# Patient Record
Sex: Male | Born: 1945 | Race: Black or African American | Hispanic: No | Smoking: Former smoker
Health system: Southern US, Community
[De-identification: ages and names within clinical notes are randomized; demographics above are authoritative.]

## PROBLEM LIST (undated history)

## (undated) DIAGNOSIS — I2510 Atherosclerotic heart disease of native coronary artery without angina pectoris: Secondary | ICD-10-CM

## (undated) DIAGNOSIS — R54 Age-related physical debility: Secondary | ICD-10-CM

## (undated) DIAGNOSIS — E785 Hyperlipidemia, unspecified: Secondary | ICD-10-CM

## (undated) DIAGNOSIS — M109 Gout, unspecified: Secondary | ICD-10-CM

## (undated) DIAGNOSIS — G231 Progressive supranuclear ophthalmoplegia [Steele-Richardson-Olszewski]: Secondary | ICD-10-CM

## (undated) DIAGNOSIS — I10 Essential (primary) hypertension: Secondary | ICD-10-CM

## (undated) DIAGNOSIS — M1990 Unspecified osteoarthritis, unspecified site: Secondary | ICD-10-CM

## (undated) DIAGNOSIS — I509 Heart failure, unspecified: Secondary | ICD-10-CM

## (undated) DIAGNOSIS — R410 Disorientation, unspecified: Secondary | ICD-10-CM

## (undated) DIAGNOSIS — N189 Chronic kidney disease, unspecified: Secondary | ICD-10-CM

## (undated) DIAGNOSIS — D649 Anemia, unspecified: Secondary | ICD-10-CM

## (undated) HISTORY — PX: TONSILLECTOMY: SUR1361

## (undated) HISTORY — PX: ABDOMINAL EXPLORATION SURGERY: SHX538

## (undated) HISTORY — PX: APPENDECTOMY: SHX54

## (undated) HISTORY — PX: CARDIAC SURGERY: SHX584

---

## 2013-05-27 ENCOUNTER — Emergency Department (HOSPITAL_COMMUNITY): Payer: Medicare HMO

## 2013-05-27 ENCOUNTER — Encounter (HOSPITAL_COMMUNITY): Payer: Self-pay | Admitting: Emergency Medicine

## 2013-05-27 ENCOUNTER — Inpatient Hospital Stay (HOSPITAL_COMMUNITY)
Admission: AD | Admit: 2013-05-27 | Discharge: 2013-05-31 | DRG: 291 | Disposition: A | Discharge status: Home Health | Payer: Medicare HMO | Attending: Internal Medicine | Admitting: Internal Medicine

## 2013-05-27 DIAGNOSIS — E785 Hyperlipidemia, unspecified: Secondary | ICD-10-CM | POA: Diagnosis present

## 2013-05-27 DIAGNOSIS — N289 Disorder of kidney and ureter, unspecified: Secondary | ICD-10-CM | POA: Diagnosis present

## 2013-05-27 DIAGNOSIS — I5043 Acute on chronic combined systolic (congestive) and diastolic (congestive) heart failure: Secondary | ICD-10-CM | POA: Diagnosis present

## 2013-05-27 DIAGNOSIS — T380X5A Adverse effect of glucocorticoids and synthetic analogues, initial encounter: Secondary | ICD-10-CM | POA: Diagnosis present

## 2013-05-27 DIAGNOSIS — I248 Other forms of acute ischemic heart disease: Secondary | ICD-10-CM | POA: Diagnosis present

## 2013-05-27 DIAGNOSIS — M25469 Effusion, unspecified knee: Secondary | ICD-10-CM | POA: Diagnosis present

## 2013-05-27 DIAGNOSIS — R443 Hallucinations, unspecified: Secondary | ICD-10-CM | POA: Diagnosis present

## 2013-05-27 DIAGNOSIS — I2489 Other forms of acute ischemic heart disease: Secondary | ICD-10-CM | POA: Diagnosis present

## 2013-05-27 DIAGNOSIS — IMO0002 Reserved for concepts with insufficient information to code with codable children: Secondary | ICD-10-CM | POA: Diagnosis present

## 2013-05-27 DIAGNOSIS — F17200 Nicotine dependence, unspecified, uncomplicated: Secondary | ICD-10-CM | POA: Diagnosis present

## 2013-05-27 DIAGNOSIS — N183 Chronic kidney disease, stage 3 unspecified: Secondary | ICD-10-CM | POA: Diagnosis present

## 2013-05-27 DIAGNOSIS — R7989 Other specified abnormal findings of blood chemistry: Secondary | ICD-10-CM | POA: Diagnosis present

## 2013-05-27 DIAGNOSIS — I129 Hypertensive chronic kidney disease with stage 1 through stage 4 chronic kidney disease, or unspecified chronic kidney disease: Secondary | ICD-10-CM | POA: Diagnosis present

## 2013-05-27 DIAGNOSIS — N189 Chronic kidney disease, unspecified: Secondary | ICD-10-CM | POA: Diagnosis present

## 2013-05-27 DIAGNOSIS — I2510 Atherosclerotic heart disease of native coronary artery without angina pectoris: Secondary | ICD-10-CM | POA: Diagnosis present

## 2013-05-27 DIAGNOSIS — I10 Essential (primary) hypertension: Secondary | ICD-10-CM | POA: Diagnosis present

## 2013-05-27 DIAGNOSIS — D72829 Elevated white blood cell count, unspecified: Secondary | ICD-10-CM | POA: Diagnosis present

## 2013-05-27 DIAGNOSIS — M109 Gout, unspecified: Secondary | ICD-10-CM | POA: Diagnosis present

## 2013-05-27 DIAGNOSIS — N179 Acute kidney failure, unspecified: Secondary | ICD-10-CM | POA: Diagnosis present

## 2013-05-27 DIAGNOSIS — Z79899 Other long term (current) drug therapy: Secondary | ICD-10-CM

## 2013-05-27 DIAGNOSIS — M79609 Pain in unspecified limb: Secondary | ICD-10-CM | POA: Diagnosis present

## 2013-05-27 DIAGNOSIS — I509 Heart failure, unspecified: Principal | ICD-10-CM | POA: Diagnosis present

## 2013-05-27 DIAGNOSIS — Z951 Presence of aortocoronary bypass graft: Secondary | ICD-10-CM

## 2013-05-27 DIAGNOSIS — G9340 Encephalopathy, unspecified: Secondary | ICD-10-CM | POA: Diagnosis not present

## 2013-05-27 HISTORY — DX: Hyperlipidemia, unspecified: E78.5

## 2013-05-27 HISTORY — DX: Atherosclerotic heart disease of native coronary artery without angina pectoris: I25.10

## 2013-05-27 HISTORY — DX: Essential (primary) hypertension: I10

## 2013-05-27 HISTORY — DX: Gout, unspecified: M10.9

## 2013-05-27 LAB — CBC WITH DIFFERENTIAL/PLATELET
Basophils Relative: 0 % (ref 0–1)
HCT: 35.7 % — ABNORMAL LOW (ref 39.0–52.0)
Hemoglobin: 11.7 g/dL — ABNORMAL LOW (ref 13.0–17.0)
Lymphocytes Relative: 14 % (ref 12–46)
MCHC: 32.8 g/dL (ref 30.0–36.0)
Monocytes Absolute: 0.8 10*3/uL (ref 0.1–1.0)
Monocytes Relative: 7 % (ref 3–12)
Neutro Abs: 8.4 10*3/uL — ABNORMAL HIGH (ref 1.7–7.7)
Neutrophils Relative %: 78 % — ABNORMAL HIGH (ref 43–77)
Platelets: 228 10*3/uL (ref 150–400)
RBC: 3.94 MIL/uL — ABNORMAL LOW (ref 4.22–5.81)
WBC: 10.7 10*3/uL — ABNORMAL HIGH (ref 4.0–10.5)

## 2013-05-27 LAB — BASIC METABOLIC PANEL
BUN: 40 mg/dL — ABNORMAL HIGH (ref 6–23)
CO2: 20 mEq/L (ref 19–32)
Chloride: 108 mEq/L (ref 96–112)
Creatinine, Ser: 1.97 mg/dL — ABNORMAL HIGH (ref 0.50–1.35)
GFR calc Af Amer: 38 mL/min — ABNORMAL LOW (ref >90)
Potassium: 4.3 mEq/L (ref 3.5–5.1)

## 2013-05-27 LAB — CBC
HCT: 36.8 % — ABNORMAL LOW (ref 39.0–52.0)
Hemoglobin: 11.8 g/dL — ABNORMAL LOW (ref 13.0–17.0)
MCH: 29 pg (ref 26.0–34.0)
MCHC: 32.1 g/dL (ref 30.0–36.0)
MCV: 90.4 fL (ref 78.0–100.0)
Platelets: 221 10*3/uL (ref 150–400)
RDW: 14.1 % (ref 11.5–15.5)
WBC: 12.6 10*3/uL — ABNORMAL HIGH (ref 4.0–10.5)

## 2013-05-27 LAB — TSH: TSH: 0.623 u[IU]/mL (ref 0.350–4.500)

## 2013-05-27 LAB — TROPONIN I
Troponin I: 0.5 ng/mL (ref <0.30)
Troponin I: 0.42 ng/mL (ref <0.30)
Troponin I: 0.4 ng/mL (ref <0.30)

## 2013-05-27 LAB — CREATININE, SERUM: GFR calc Af Amer: 39 mL/min — ABNORMAL LOW (ref >90)

## 2013-05-27 LAB — PRO B NATRIURETIC PEPTIDE: Pro B Natriuretic peptide (BNP): 6983 pg/mL — ABNORMAL HIGH (ref 0–125)

## 2013-05-27 MED ORDER — SODIUM CHLORIDE 0.9 % IV SOLN: 250.0000 mL | INTRAVENOUS | Status: DC | PRN

## 2013-05-27 MED ORDER — SODIUM CHLORIDE 0.9 % IJ SOLN
3.0000 mL | INTRAMUSCULAR | Status: DC | PRN
  Administered 2013-05-27: 16:00:00 3 mL via INTRAVENOUS

## 2013-05-27 MED ORDER — MORPHINE SULFATE 4 MG/ML IJ SOLN
4.0000 mg | Freq: Once | INTRAMUSCULAR | Status: AC
  Administered 2013-05-27: 4 mg via INTRAVENOUS
  Filled 2013-05-27: qty 1

## 2013-05-27 MED ORDER — ATORVASTATIN CALCIUM 20 MG PO TABS
20.0000 mg | ORAL_TABLET | Freq: Every day | ORAL | Status: DC
  Administered 2013-05-27 – 2013-05-31 (×5): 20 mg via ORAL
  Filled 2013-05-27 (×5): qty 1

## 2013-05-27 MED ORDER — COLCHICINE 0.6 MG PO TABS
1.2000 mg | ORAL_TABLET | Freq: Once | ORAL | Status: AC
  Administered 2013-05-27: 0.6 mg via ORAL
  Filled 2013-05-27: qty 2

## 2013-05-27 MED ORDER — PREDNISONE 20 MG PO TABS
20.0000 mg | ORAL_TABLET | Freq: Every day | ORAL | Status: DC
  Administered 2013-05-28: 08:00:00 20 mg via ORAL
  Filled 2013-05-27 (×2): qty 1

## 2013-05-27 MED ORDER — MORPHINE SULFATE 2 MG/ML IJ SOLN
1.0000 mg | INTRAMUSCULAR | Status: DC | PRN
  Administered 2013-05-28 – 2013-05-30 (×4): 2 mg via INTRAVENOUS
  Filled 2013-05-27 (×4): qty 1

## 2013-05-27 MED ORDER — ASPIRIN EC 325 MG PO TBEC
325.0000 mg | DELAYED_RELEASE_TABLET | Freq: Every day | ORAL | Status: DC
  Administered 2013-05-27 – 2013-05-31 (×5): 325 mg via ORAL
  Filled 2013-05-27 (×5): qty 1

## 2013-05-27 MED ORDER — HYDRALAZINE HCL 20 MG/ML IJ SOLN
10.0000 mg | Freq: Four times a day (QID) | INTRAMUSCULAR | Status: DC | PRN
Start: 2013-05-27 — End: 2013-05-31
  Administered 2013-05-27 (×2): 10 mg via INTRAVENOUS
  Filled 2013-05-27 (×3): qty 1

## 2013-05-27 MED ORDER — ONDANSETRON HCL 4 MG PO TABS: 4.0000 mg | ORAL_TABLET | Freq: Four times a day (QID) | ORAL | Status: DC | PRN

## 2013-05-27 MED ORDER — SODIUM CHLORIDE 0.9 % IJ SOLN
3.0000 mL | Freq: Two times a day (BID) | INTRAMUSCULAR | Status: DC
  Administered 2013-05-27 – 2013-05-31 (×5): 3 mL via INTRAVENOUS

## 2013-05-27 MED ORDER — LABETALOL HCL 5 MG/ML IV SOLN
5.0000 mg | Freq: Once | INTRAVENOUS | Status: AC
  Administered 2013-05-27: 15:00:00 5 mg via INTRAVENOUS
  Filled 2013-05-27: qty 4

## 2013-05-27 MED ORDER — HEPARIN SODIUM (PORCINE) 5000 UNIT/ML IJ SOLN
5000.0000 [IU] | Freq: Three times a day (TID) | INTRAMUSCULAR | Status: DC
  Administered 2013-05-27 – 2013-05-31 (×13): 5000 [IU] via SUBCUTANEOUS
  Filled 2013-05-27 (×15): qty 1

## 2013-05-27 MED ORDER — OXYCODONE HCL 5 MG PO TABS
5.0000 mg | ORAL_TABLET | ORAL | Status: DC | PRN
  Administered 2013-05-27 – 2013-05-31 (×9): 5 mg via ORAL
  Filled 2013-05-27 (×9): qty 1

## 2013-05-27 MED ORDER — ONDANSETRON HCL 4 MG/2ML IJ SOLN: 4.0000 mg | Freq: Four times a day (QID) | INTRAMUSCULAR | Status: DC | PRN

## 2013-05-27 MED ORDER — CARVEDILOL 25 MG PO TABS
25.0000 mg | ORAL_TABLET | Freq: Two times a day (BID) | ORAL | Status: DC
  Administered 2013-05-27 – 2013-05-31 (×9): 25 mg via ORAL
  Filled 2013-05-27 (×10): qty 1

## 2013-05-27 MED ORDER — SODIUM CHLORIDE 0.9 % IV SOLN
INTRAVENOUS | Status: DC
  Administered 2013-05-27: 10:00:00 via INTRAVENOUS

## 2013-05-27 MED ORDER — COLCHICINE 0.6 MG PO TABS
0.6000 mg | ORAL_TABLET | Freq: Once | ORAL | Status: AC
  Filled 2013-05-27: qty 1

## 2013-05-27 MED ORDER — SODIUM CHLORIDE 0.9 % IJ SOLN
3.0000 mL | Freq: Two times a day (BID) | INTRAMUSCULAR | Status: DC
  Administered 2013-05-30: 22:00:00 3 mL via INTRAVENOUS

## 2013-05-27 MED ORDER — SODIUM CHLORIDE 0.9 % IV SOLN
INTRAVENOUS | Status: DC
  Administered 2013-05-27: 15:00:00 via INTRAVENOUS

## 2013-05-27 MED ORDER — FUROSEMIDE 10 MG/ML IJ SOLN
40.0000 mg | Freq: Once | INTRAMUSCULAR | Status: AC
  Administered 2013-05-27: 16:00:00 40 mg via INTRAVENOUS
  Filled 2013-05-27: qty 4

## 2013-05-27 MED ORDER — NITROGLYCERIN 0.4 MG SL SUBL: 0.4000 mg | SUBLINGUAL_TABLET | SUBLINGUAL | Status: DC | PRN

## 2013-05-27 MED ORDER — COLCHICINE 0.6 MG PO TABS
0.6000 mg | ORAL_TABLET | Freq: Every day | ORAL | Status: DC
  Administered 2013-05-28 – 2013-05-29 (×2): 0.6 mg via ORAL
  Filled 2013-05-27 (×2): qty 1

## 2013-05-27 MED ORDER — AMLODIPINE BESYLATE 10 MG PO TABS
10.0000 mg | ORAL_TABLET | Freq: Every day | ORAL | Status: DC
  Administered 2013-05-27 – 2013-05-31 (×5): 10 mg via ORAL
  Filled 2013-05-27 (×5): qty 1

## 2013-05-27 MED ORDER — MORPHINE SULFATE 2 MG/ML IJ SOLN
1.0000 mg | INTRAMUSCULAR | Status: DC | PRN
  Administered 2013-05-27: 1 mg via INTRAVENOUS
  Filled 2013-05-27: qty 1

## 2013-05-27 NOTE — ED Notes (Signed)
Lab result called to MD, RN

## 2013-05-27 NOTE — H&P (Signed)
[facility] History and Physical  Martin Cain JXB:[id number] DOB: Oct 21, 1945 DOA: 05/27/2013  Referring physician: Dr. Freida Busman PCP: No primary provider on file.  Specialists: I have consulted cardiology. [place]  Chief Complaint: Right big toe pain, Right knee pain  HPI: Martin Cain is a 67 y.o. male  With history of coronary artery disease status post CABG 4 vessel, gout, and hypertension. Who presents to the emergency department complaining of right great toe as well as right knee pain. The patient is accompanied by his daughter which she is visiting for the holidays. Patient lives in [place]. He denies any history of congestive heart failure or kidney disease. He denies any chest discomfort. States that the knee pain right big toe pain had been present the last few days has been progressively getting worse and nothing he is aware of makes it better. He denies any recent falls or trauma to the area,  In the emergency department patient was found to have an elevated creatinine to 1.9, elevated troponin, and elevated BNP at 6983.  Given findings we will consult for further admission and evaluation.  Review of Systems:   Past Medical History  Diagnosis Date  . Hypertension    Past Surgical History  Procedure Laterality Date  . Cardiac surgery     Social History:  reports that he has been smoking Cigarettes.  He has a 20 pack-year smoking history. He has never used smokeless tobacco. He reports that he drinks alcohol. He reports that he does not use illicit drugs. Pt lives in [place]: [place]  Can patient participate in ADLs? Yes although limited currently due to pain  No Known Allergies  History reviewed. No pertinent family history. no other family history  Prior to Admission medications   Medication Sig Start Date End Date Taking? Authorizing Provider  amLODipine (NORVASC) 10 MG tablet Take 10 mg by mouth daily.   Yes Historical Provider, MD  carvedilol (COREG) 25  MG tablet Take 25 mg by mouth 2 (two) times daily with a meal.   Yes Historical Provider, MD  furosemide (LASIX) 40 MG tablet Take 40 mg by mouth.   Yes Historical Provider, MD  ibuprofen (ADVIL,MOTRIN) 800 MG tablet Take 800 mg by mouth every 8 (eight) hours as needed (pain).   Yes Historical Provider, MD  lisinopril (PRINIVIL,ZESTRIL) 40 MG tablet Take 40 mg by mouth daily.   Yes Historical Provider, MD  nitroGLYCERIN (NITROSTAT) 0.4 MG SL tablet Place 0.4 mg under the tongue every 5 (five) minutes as needed for chest pain.   Yes Historical Provider, MD  rosuvastatin (CRESTOR) 10 MG tablet Take 10 mg by mouth daily.   Yes Historical Provider, MD   Physical Exam: Filed Vitals:   05/27/13 1335  BP: 215/79  Pulse: 104  Temp: 98.8 F (37.1 C)  Resp: 18     General:  Pt in NAD, awakens on command, somnolent  Eyes: EOMI, non icteric  ENT: normal exterior appearance  Neck: supple, no goiter  Cardiovascular: RRR, no MRG  Respiratory: CTA BL, no wheezes  Abdomen: soft, NT, ND  Skin: warm and dry  Musculoskeletal: pain with passive movement of Right big toe and RLE  Psychiatric: mood and affect appropriate  Neurologic: answers questions appropriately, moves extremities equally.  Labs on Admission:  Basic Metabolic Panel:  Recent Labs Lab 05/27/13 1005  NA 141  K 4.3  CL 108  CO2 20  GLUCOSE 105*  BUN 40*  CREATININE 1.97*  CALCIUM 9.3   Liver  Function Tests: No results found for this basename: AST, ALT, ALKPHOS, BILITOT, PROT, ALBUMIN,  in the last 168 hours No results found for this basename: LIPASE, AMYLASE,  in the last 168 hours No results found for this basename: AMMONIA,  in the last 168 hours CBC:  Recent Labs Lab 05/27/13 1005  WBC 10.7*  NEUTROABS 8.4*  HGB 11.7*  HCT 35.7*  MCV 90.6  PLT 228   Cardiac Enzymes:  Recent Labs Lab 05/27/13 1121  TROPONINI 0.50*    BNP (last 3 results)  Recent Labs  05/27/13 1005  PROBNP 6983.0*    CBG: No results found for this basename: GLUCAP,  in the last 168 hours  Radiological Exams on Admission: Dg Chest 2 View  05/27/2013   CLINICAL DATA:  Hypertension, peripheral swelling  EXAM: CHEST  2 VIEW  COMPARISON:  None  FINDINGS: Cardiac shadow is mildly enlarged. Postsurgical changes are seen. The lungs are well aerated bilaterally without focal infiltrate or sizable effusion. No acute bony abnormality is noted.  IMPRESSION: No acute abnormality noted.   Electronically Signed   By: Alcide Clever M.D.   On: 05/27/2013 10:10    EKG: Independently reviewed. Sinus tachycardia with LVH by precordial lead criteria, With elevated ST elevation at V1-V4 and inverted T waves at V 5 and V 6  Assessment/Plan Principal problem Acute Congestive heart failure (unknown whether systolic or diastolic) and elevated troponin Active Problems:   Renal insufficiency   Acute exacerbation of CHF (congestive heart failure) Acute gout flair    1. Acute congestive heart failure - At this point unknown whether systolic or diastolic given the patient is from [place] and currently we do not have any records to distinguish. Patient denies any history of CHF. - Will monitor on telemetry - Obtain echocardiogram - Consult cardiology given complicated cardiac history as well as elevated troponin - Could be secondary to malignant hypertension - Daily weights, strict I/O's  2. elevated troponin - Most likely due to demand ischemia given #1 - Consult cardiology - Will place on aspirin while in house - Continue n.p.o. status until cleared by cardiology  3. acute gout flare - Consult for pharmacy for proper colchicine dose given elevated creatinine - We'll administer low-dose prednisone - Place order for opioids when necessary , Have discussed with nursing as patient currently somnolent from morphine administration in the ED as such would avoid opioids until patient more alert and awake.  4. renal  insufficiency - Do not have prior history to indicate baseline serum creatinine - At this juncture we'll hold ACE inhibitor as well as nonsteroidal anti-inflammatory medication - We'll monitor serum creatinine next a.m.  5. DVT prophylaxis - Place on heparin  Code Status: Partial code patient does not want intubation Family Communication: Discussed with patient and daughter at bedside  Disposition Plan: Pending further evaluation and recommendations from specialist and improvement in current condition  Time spent: > 55 minutes  Martin Cain [facility] Pager [phone]  If 7PM-7AM, please contact night-coverage [email or web address] Password [facility] 05/27/2013, 3:04 PM

## 2013-05-27 NOTE — Consult Note (Addendum)
HPI: 67 yo male for evaluation of CHF and abnormal troponin. Had CABG 2000 in [place]; no records available. Patient followed there but here visiting. Patient presented today with complaints of right knee pain and diagnosed with gout. Noted to have lower extremity edema and abnormal troponin and cardiology asked to evaluate; denies chest pain, dyspnea or syncope. States he has had lower ext edema for 3 days only.   Medications Prior to Admission  Medication Sig Dispense Refill  . amLODipine (NORVASC) 10 MG tablet Take 10 mg by mouth daily.      . carvedilol (COREG) 25 MG tablet Take 25 mg by mouth 2 (two) times daily with a meal.      . furosemide (LASIX) 40 MG tablet Take 40 mg by mouth.      Marland Kitchen ibuprofen (ADVIL,MOTRIN) 800 MG tablet Take 800 mg by mouth every 8 (eight) hours as needed (pain).      Marland Kitchen lisinopril (PRINIVIL,ZESTRIL) 40 MG tablet Take 40 mg by mouth daily.      . nitroGLYCERIN (NITROSTAT) 0.4 MG SL tablet Place 0.4 mg under the tongue every 5 (five) minutes as needed for chest pain.      . rosuvastatin (CRESTOR) 10 MG tablet Take 10 mg by mouth daily.        No Known Allergies  Past Medical History  Diagnosis Date  . Hypertension   . Hyperlipidemia   . Renal insufficiency   . CAD (coronary artery disease)   . Gout     Past Surgical History  Procedure Laterality Date  . Cardiac surgery      2000; [place]  . Appendectomy    . Abdominal exploration surgery    . Tonsillectomy      History   Social History  . Marital Status: Divorced    Spouse Name: N/A    Number of Children: 2  . Years of Education: N/A   Occupational History  . Not on file.   Social History Main Topics  . Smoking status: Current Every Day Smoker -- 0.50 packs/day for 40 years    Types: Cigarettes  . Smokeless tobacco: Never Used  . Alcohol Use: Yes     Comment: 1 pint per day   . Drug Use: No  . Sexual Activity: Not on file   Other Topics Concern  . Not on file   Social  History Narrative  . No narrative on file    Family History  Problem Relation Age of Onset  . Heart disease      No family history    ROS:  Right knee pain and lower ext edema but no fevers or chills, productive cough, hemoptysis, dysphasia, odynophagia, melena, hematochezia, dysuria, hematuria, rash, seizure activity, orthopnea, PND, claudication. Remaining systems are negative.  Physical Exam:   Blood pressure 215/79, pulse 104, temperature 98.8 F (37.1 C), temperature source Oral, resp. rate 18, SpO2 100.00%.  General:  Well developed/thin in NAD Skin warm/dry Patient not depressed No peripheral clubbing Back-normal HEENT-normal/normal eyelids Neck supple/normal carotid upstroke bilaterally; no bruits; positive JVD; no thyromegaly chest - CTA/ normal expansion; prior sternotomy CV - RRR/normal S1 and S2; no murmurs, rubs;  PMI displaced laterally; positive gallop Abdomen -NT/ND, no HSM, no mass, + bowel sounds, no bruit, previous abdominal surgery 2+ femoral pulse on left and 1+ on right, no bruits Ext-no chords; 1 + ankle edema (R>L); effusion right knee; tender to palpation Neuro-grossly nonfocal  ECG sinus rhythm, LAFB, cannot R/O prior  septal MI, LVH with repolarization abnormality.  Results for orders placed during the hospital encounter of 05/27/13 (from the past 48 hour(s))  PRO B NATRIURETIC PEPTIDE     Status: Abnormal   Collection Time    05/27/13 10:05 AM      Result Value Range   Pro B Natriuretic peptide (BNP) 6983.0 (*) 0 - 125 pg/mL  CBC WITH DIFFERENTIAL     Status: Abnormal   Collection Time    05/27/13 10:05 AM      Result Value Range   WBC 10.7 (*) 4.0 - 10.5 K/uL   RBC 3.94 (*) 4.22 - 5.81 MIL/uL   Hemoglobin 11.7 (*) 13.0 - 17.0 g/dL   HCT 09.8 (*) 11.9 - 14.7 %   MCV 90.6  78.0 - 100.0 fL   MCH 29.7  26.0 - 34.0 pg   MCHC 32.8  30.0 - 36.0 g/dL   RDW 82.9  56.2 - 13.0 %   Platelets 228  150 - 400 K/uL   Neutrophils Relative % 78 (*) 43 -  77 %   Neutro Abs 8.4 (*) 1.7 - 7.7 K/uL   Lymphocytes Relative 14  12 - 46 %   Lymphs Abs 1.5  0.7 - 4.0 K/uL   Monocytes Relative 7  3 - 12 %   Monocytes Absolute 0.8  0.1 - 1.0 K/uL   Eosinophils Relative 0  0 - 5 %   Eosinophils Absolute 0.0  0.0 - 0.7 K/uL   Basophils Relative 0  0 - 1 %   Basophils Absolute 0.0  0.0 - 0.1 K/uL  BASIC METABOLIC PANEL     Status: Abnormal   Collection Time    05/27/13 10:05 AM      Result Value Range   Sodium 141  135 - 145 mEq/L   Potassium 4.3  3.5 - 5.1 mEq/L   Chloride 108  96 - 112 mEq/L   CO2 20  19 - 32 mEq/L   Glucose, Bld 105 (*) 70 - 99 mg/dL   BUN 40 (*) 6 - 23 mg/dL   Creatinine, Ser 8.65 (*) 0.50 - 1.35 mg/dL   Calcium 9.3  8.4 - 78.4 mg/dL   GFR calc non Af Amer 33 (*) >90 mL/min   GFR calc Af Amer 38 (*) >90 mL/min   Comment: (NOTE)     The eGFR has been calculated using the CKD EPI equation.     This calculation has not been validated in all clinical situations.     eGFR's persistently <90 mL/min signify possible Chronic Kidney     Disease.  TROPONIN I     Status: Abnormal   Collection Time    05/27/13 11:21 AM      Result Value Range   Troponin I 0.50 (*) <0.30 ng/mL   Comment:            Due to the release kinetics of cTnI,     a negative result within the first hours     of the onset of symptoms does not rule out     myocardial infarction with certainty.     If myocardial infarction is still suspected,     repeat the test at appropriate intervals.     CRITICAL RESULT CALLED TO, READ BACK BY AND VERIFIED WITH:     WEST,S. RN AT 1153 05/27/13 BARFIELD,T    Dg Chest 2 View  05/27/2013   CLINICAL DATA:  Hypertension, peripheral swelling  EXAM: CHEST  2 VIEW  COMPARISON:  None  FINDINGS: Cardiac shadow is mildly enlarged. Postsurgical changes are seen. The lungs are well aerated bilaterally without focal infiltrate or sizable effusion. No acute bony abnormality is noted.  IMPRESSION: No acute abnormality noted.    Electronically Signed   By: Alcide Clever M.D.   On: 05/27/2013 10:10    Assessment/Plan 1 elevated troponin-patient is not having chest pain. Elevation may be related to renal insufficiency. Continue to cycle enzymes. If no clear trend up patient can followup in [place] with his cardiologist for outpatient nuclear study. 2 severe hypertension-patient's blood pressure is significantly elevated. He has not received his blood pressure medications yet today. Would resume all of his medications and follow blood pressure. Increase as needed. 3 congestive heart failure-patient's LV function is unknown. It would be helpful if we could obtain records from [place]. Schedule echocardiogram to assess LV function. Treat with Lasix 40 mg IV x 1 and reassess in AM. Follow renal function. 4 stage III renal insufficiency-baseline creatinine is unknown. We will obtain outside records. Follow renal function closely with diuresis. 5 coronary artery disease-continue aspirin and statin. 6 right knee with fusion-management per primary care. Would avoid nonsteroidals if possible given renal insufficiency Olga Millers MD 05/27/2013, 3:31 PM

## 2013-05-27 NOTE — ED Notes (Signed)
Bed: WA06 Expected date:  Expected time:  Means of arrival:  Comments: gout

## 2013-05-27 NOTE — ED Provider Notes (Signed)
CSN: [id number]     Arrival date & time 05/27/13  4098 History   First MD Initiated Contact with Patient 05/27/13 [phone]     Chief Complaint  Patient presents with  . Gout   (Consider location/radiation/quality/duration/timing/severity/associated sxs/prior Treatment) The history is provided by the patient.   Patient here complaining of 3 days of right knee and right first MTP joint pain similar to his prior gout. He also notes increased bilateral lower extremity pitting edema. Denies any chest pain or chest pressure. No shortness of breath. Denies any syncope or syncope. Pain characterized as sharp and worse with standing. No treatment used prior to arrival. No fever or chills. Denies any trauma to his knee or foot Past Medical History  Diagnosis Date  . Hypertension    Past Surgical History  Procedure Laterality Date  . Cardiac surgery     History reviewed. No pertinent family history. History  Substance Use Topics  . Smoking status: Current Every Day Smoker -- 0.50 packs/day    Types: Cigarettes  . Smokeless tobacco: Not on file  . Alcohol Use: Yes    Review of Systems  All other systems reviewed and are negative.    Allergies  Review of patient's allergies indicates no known allergies.  Home Medications  No current outpatient prescriptions on file. BP 189/97  Pulse 100  Temp(Src) 98.2 F (36.8 C) (Oral)  Resp 16  SpO2 95% Physical Exam  Nursing note and vitals reviewed. Constitutional: He is oriented to person, place, and time. He appears well-developed and well-nourished.  Non-toxic appearance. No distress.  HENT:  Head: Normocephalic and atraumatic.  Eyes: Conjunctivae, EOM and lids are normal. Pupils are equal, round, and reactive to light.  Neck: Normal range of motion. Neck supple. No tracheal deviation present. No mass present.  Cardiovascular: Normal rate, regular rhythm and normal heart sounds.  Exam reveals no gallop.   No murmur heard. Pulmonary/Chest:  Effort normal and breath sounds normal. No stridor. No respiratory distress. He has no decreased breath sounds. He has no wheezes. He has no rhonchi. He has no rales.  Abdominal: Soft. Normal appearance and bowel sounds are normal. He exhibits no distension. There is no tenderness. There is no rebound and no CVA tenderness.  Musculoskeletal: Normal range of motion. He exhibits no edema and no tenderness.       Legs: 3+ bilateral lower extremity pitting edema  Neurological: He is alert and oriented to person, place, and time. He has normal strength. No cranial nerve deficit or sensory deficit. GCS eye subscore is 4. GCS verbal subscore is 5. GCS motor subscore is 6.  Skin: Skin is warm and dry. No abrasion and no rash noted.  Psychiatric: He has a normal mood and affect. His speech is normal and behavior is normal.    ED Course  Procedures (including critical care time) Labs Review Labs Reviewed  PRO B NATRIURETIC PEPTIDE  CBC WITH DIFFERENTIAL  BASIC METABOLIC PANEL   Imaging Review No results found.  EKG Interpretation    Date/Time:  Friday May 27 2013 10:16:43 EST Ventricular Rate:  101 PR Interval:  171 QRS Duration: 98 QT Interval:  356 QTC Calculation: 461 R Axis:   -83 Text Interpretation:  Sinus tachycardia Left atrial enlargement Left anterior fascicular block Abnormal R-wave progression, late transition LVH with secondary repolarization abnormality ST elevation suggests acute pericarditis Confirmed by Sukhmani Fetherolf  MD, Makenley Shimp ([id number]) on 05/27/2013 10:22:01 AM  MDM  No diagnosis found. Patient given morphine for pain here and will be given aspirin as well. Troponin elevated but patient denies any chest pain chest pressure. Patient's EKG does not show ST elevation. Patient's BNP is elevated as well as his creatinine. He will require inpatient evaluation    Toy Baker, MD 05/27/13 1224

## 2013-05-27 NOTE — Progress Notes (Signed)
Pharmacy Consult Note - Colchicine for Acute Gout Flare  Labs: Scr 1.97, CrCl N 36  A/P: Estimated normalized CrCl > 30 so no need for dose adjustment per renal function of colchicine. Give colchicine 1.2mg  x 1 then 0.6mg  1 hr later. Then, start colchicine 0.6mg  once daily beginning tomorrow for continued prophylaxis. Will monitor renal function for potential changes in dosage.  Hessie Knows, PharmD, BCPS Pager [phone] 05/27/2013 4:18 PM

## 2013-05-27 NOTE — Progress Notes (Signed)
   CARE MANAGEMENT ED NOTE 05/27/2013  Patient:  Martin Cain, Martin Cain   Account Number:  [id number]  Date Initiated:  05/27/2013  Documentation initiated by:  Edd Arbour  Subjective/Objective Assessment:   67 yr old male self pay pt visting daughter in [place] from [place] c/o right toe and knee pain with elevated troponin 0.50, elevated BP 189/97, 215/79 given ns at 125 changed to 50 cc/hr O2 2 L for sat at 905 increased to 100% BNP 6983     Subjective/Objective Assessment Detail:   Chest xray not indicating CHF. CV consult stating elevation in troponin see EPIC notes     Action/Plan:   Action/Plan Detail:   UR completed   Anticipated DC Date:  05/28/2013     Status Recommendation to Physician:  Recommend IP Status instead of OBV Result of Recommendation:  Agreed  Other ED Services  Consult Working Plan    DC Planning Services  Other    Choice offered to / List presented to:            Status of service:  Completed, signed off  ED Comments:   ED Comments Detail:

## 2013-05-27 NOTE — ED Notes (Signed)
Per EMS pt coming with c/o gout flare up. Pt reports pain in his right toe as well as right knee arthritis pain.

## 2013-05-28 ENCOUNTER — Inpatient Hospital Stay (HOSPITAL_COMMUNITY): Payer: Medicare HMO

## 2013-05-28 DIAGNOSIS — I359 Nonrheumatic aortic valve disorder, unspecified: Secondary | ICD-10-CM

## 2013-05-28 DIAGNOSIS — I5043 Acute on chronic combined systolic (congestive) and diastolic (congestive) heart failure: Secondary | ICD-10-CM | POA: Diagnosis present

## 2013-05-28 LAB — TROPONIN I: Troponin I: 0.43 ng/mL (ref <0.30)

## 2013-05-28 LAB — BASIC METABOLIC PANEL
Calcium: 9.3 mg/dL (ref 8.4–10.5)
Chloride: 104 mEq/L (ref 96–112)
GFR calc Af Amer: 39 mL/min — ABNORMAL LOW (ref >90)
Potassium: 3.7 mEq/L (ref 3.5–5.1)
Sodium: 141 mEq/L (ref 135–145)

## 2013-05-28 LAB — CBC
HCT: 37.3 % — ABNORMAL LOW (ref 39.0–52.0)
MCHC: 34.6 g/dL (ref 30.0–36.0)
Platelets: 219 10*3/uL (ref 150–400)
RDW: 14.1 % (ref 11.5–15.5)
WBC: 14.6 10*3/uL — ABNORMAL HIGH (ref 4.0–10.5)

## 2013-05-28 LAB — SODIUM, URINE, RANDOM: Sodium, Ur: 89 mEq/L

## 2013-05-28 LAB — CREATININE, URINE, RANDOM: Creatinine, Urine: 132 mg/dL

## 2013-05-28 MED ORDER — PREDNISONE 20 MG PO TABS
40.0000 mg | ORAL_TABLET | Freq: Every day | ORAL | Status: DC
  Administered 2013-05-29: 40 mg via ORAL
  Filled 2013-05-28 (×2): qty 2

## 2013-05-28 MED ORDER — PANTOPRAZOLE SODIUM 40 MG PO TBEC
40.0000 mg | DELAYED_RELEASE_TABLET | Freq: Every day | ORAL | Status: DC
  Administered 2013-05-28 – 2013-05-31 (×4): 40 mg via ORAL
  Filled 2013-05-28 (×5): qty 1

## 2013-05-28 MED ORDER — FUROSEMIDE 20 MG PO TABS
20.0000 mg | ORAL_TABLET | Freq: Every day | ORAL | Status: DC
  Administered 2013-05-28: 09:00:00 20 mg via ORAL
  Filled 2013-05-28 (×2): qty 1

## 2013-05-28 MED ORDER — LISINOPRIL 10 MG PO TABS
10.0000 mg | ORAL_TABLET | Freq: Every day | ORAL | Status: DC
  Administered 2013-05-28: 10 mg via ORAL
  Filled 2013-05-28 (×2): qty 1

## 2013-05-28 MED ORDER — LORAZEPAM 0.5 MG PO TABS
0.5000 mg | ORAL_TABLET | Freq: Once | ORAL | Status: AC
  Administered 2013-05-28: 0.5 mg via ORAL
  Filled 2013-05-28: qty 1

## 2013-05-28 MED ORDER — METHYLPREDNISOLONE SODIUM SUCC 40 MG IJ SOLR
40.0000 mg | Freq: Three times a day (TID) | INTRAMUSCULAR | Status: AC
  Administered 2013-05-28 (×2): 40 mg via INTRAVENOUS
  Filled 2013-05-28 (×2): qty 1

## 2013-05-28 NOTE — Progress Notes (Signed)
Cardiologist on call was notified of elevated Troponin levels. Last 3 Troponin levels were : 0.42 ,0.40, 0.43. Will continue to monitor the patient.

## 2013-05-28 NOTE — Progress Notes (Signed)
    Subjective:  Denies CP or dyspnea; complains of right knee pain   Objective:  Filed Vitals:   05/27/13 2126 05/27/13 2306 05/28/13 0044 05/28/13 0535  BP: 179/78 174/82 152/78 167/83  Pulse: 111 100 108 108  Temp:    97.5 F (36.4 C)  TempSrc:    Oral  Resp:    20  Height:      Weight:    125 lb 7.1 oz (56.9 kg)  SpO2:    99%    Intake/Output from previous day:  Intake/Output Summary (Last 24 hours) at 05/28/13 1610 Last data filed at 05/28/13 0537  Gross per 24 hour  Intake 404.17 ml  Output   2275 ml  Net -1870.83 ml    Physical Exam: Physical exam: Well-developed thin in no acute distress.  Skin is warm and dry.  HEENT is normal.  Neck is supple.  Chest is clear to auscultation with normal expansion.  Cardiovascular exam is regular rate and rhythm.  Abdominal exam nontender or distended. No masses palpated. Extremities show trace edema; right knee effusion. neuro grossly intact    Lab Results: Basic Metabolic Panel:  Recent Labs  96/04/54 1005 05/27/13 1638 05/28/13 0432  NA 141  --  141  K 4.3  --  3.7  CL 108  --  104  CO2 20  --  22  GLUCOSE 105*  --  102*  BUN 40*  --  39*  CREATININE 1.97* 1.93* 1.95*  CALCIUM 9.3  --  9.3  MG  --  1.6  --   PHOS  --  3.8  --    CBC:  Recent Labs  05/27/13 1005 05/27/13 1638 05/28/13 0432  WBC 10.7* 12.6* 14.6*  NEUTROABS 8.4*  --   --   HGB 11.7* 11.8* 12.9*  HCT 35.7* 36.8* 37.3*  MCV 90.6 90.4 89.0  PLT 228 221 219   Cardiac Enzymes:  Recent Labs  05/27/13 1638 05/27/13 2222 05/28/13 0432  TROPONINI 0.42* 0.40* 0.43*     Assessment/Plan:  1 elevated troponin-patient is not having chest pain. No clear trend up. Elevation may be related to renal insufficiency. Patient should fu with his cardiologist in [place] for outpt nuclear study. 2 severe hypertension-patient's blood pressure remains mildly elevated; would continue present meds; resume ACEI at lower dose (lisinopril 10 mg  daily) and lasix 20 mg daily; increase meds as needed. 3 congestive heart failure-patient's LV function is unknown. It would be helpful if we could obtain records from [place]. Echocardiogram to assess LV function.  4 stage III renal insufficiency-baseline creatinine is unknown. Need outside records. Follow renal function closely with diuresis.  5 coronary artery disease-continue aspirin and statin.  6 right knee effusion-management per primary care. Would avoid nonsteroidals if possible given renal insufficiency.   Martin Cain 05/28/2013, 6:52 AM

## 2013-05-28 NOTE — Progress Notes (Signed)
  Echocardiogram 2D Echocardiogram has been performed.  Martin Cain 05/28/2013, 11:13 AM

## 2013-05-28 NOTE — Progress Notes (Signed)
PCP on call was notified that  the last 3 Troponins were elevated: 0.42,0.40,0.43 respectively.  Awaiting any new orders.

## 2013-05-28 NOTE — Evaluation (Signed)
Physical Therapy Evaluation Patient Details Name: Martin Cain MRN: [id number] DOB: 1946-06-16 Today's Date: 05/28/2013 Time: 1191-4782 PT Time Calculation (min): 15 min  PT Assessment / Plan / Recommendation History of Present Illness  With history of coronary artery disease status post CABG 4 vessel, gout, and hypertension. Who presents to the emergency department complaining of right great toe as well as right knee pain. The patient is accompanied by his daughter which she is visiting for the holidays. Patient lives in [place]. He denies any history of congestive heart failure or kidney disease. He denies any chest discomfort. States that the knee pain right big toe pain had been present the last few days has been progressively getting worse and nothing he is aware of makes it better. He denies any recent falls or trauma to the area,  Clinical Impression  **Pt admitted with CHF, gout flare*. Pt currently with functional limitations due to the deficits listed below (see PT Problem List).  Pt will benefit from skilled PT to increase their independence and safety with mobility to allow discharge to the venue listed below.   *    PT Assessment  Patient needs continued PT services    Follow Up Recommendations  Home health PT    Does the patient have the potential to tolerate intense rehabilitation      Barriers to Discharge        Equipment Recommendations  None recommended by PT    Recommendations for Other Services     Frequency Min 3X/week    Precautions / Restrictions Precautions Precautions: Fall Restrictions Weight Bearing Restrictions: No   Pertinent Vitals/Pain *8/10 RLE Pain meds requested Hot pack applied to R knee**      Mobility  Bed Mobility Bed Mobility: Supine to Sit Supine to Sit: 4: Min assist;HOB elevated Details for Bed Mobility Assistance: assist to support RLE Transfers Transfers: Pharmacologist;Sit to Stand;Stand to Sit Sit to  Stand: 4: Min assist;From bed Stand to Sit: 4: Min assist;To chair/3-in-1 Stand Pivot Transfers: 4: Min assist Details for Transfer Assistance: activity tolerance limited by pain Ambulation/Gait Ambulation/Gait Assistance: Not tested (comment)    Exercises     PT Diagnosis: Difficulty walking;Acute pain  PT Problem List: Decreased range of motion;Decreased activity tolerance;Decreased strength;Decreased mobility;Pain PT Treatment Interventions: Gait training;DME instruction;Therapeutic activities;Therapeutic exercise;Stair training;Patient/family education     PT Goals(Current goals can be found in the care plan section) Acute Rehab PT Goals Patient Stated Goal: to walk PT Goal Formulation: With patient Time For Goal Achievement: 06/10/13 Potential to Achieve Goals: Good  Visit Information  Last PT Received On: 05/28/13 Assistance Needed: +1 History of Present Illness: With history of coronary artery disease status post CABG 4 vessel, gout, and hypertension. Who presents to the emergency department complaining of right great toe as well as right knee pain. The patient is accompanied by his daughter which she is visiting for the holidays. Patient lives in [place]. He denies any history of congestive heart failure or kidney disease. He denies any chest discomfort. States that the knee pain right big toe pain had been present the last few days has been progressively getting worse and nothing he is aware of makes it better. He denies any recent falls or trauma to the area,       Prior Functioning  Home Living Family/patient expects to be discharged to:: Private residence Living Arrangements: Spouse/significant other Available Help at Discharge: Family;Available 24 hours/day Type of Home: Apartment Home Access: Stairs to  enter Entrance Stairs-Number of Steps: 15 Home Layout: One level Home Equipment: Cane - single point;Crutches;Walker - 2 wheels Prior Function Level of  Independence: Independent Communication Communication: No difficulties    Cognition  Cognition Arousal/Alertness: Awake/alert Behavior During Therapy: WFL for tasks assessed/performed Overall Cognitive Status: Within Functional Limits for tasks assessed    Extremity/Trunk Assessment Upper Extremity Assessment Upper Extremity Assessment: Overall WFL for tasks assessed Lower Extremity Assessment Lower Extremity Assessment: RLE deficits/detail RLE Deficits / Details: pt keeps R knee in flexed position and has pain with movement of RLE, unable to WB on RLE 2* pain RLE: Unable to fully assess due to pain Cervical / Trunk Assessment Cervical / Trunk Assessment: Normal   Balance    End of Session PT - End of Session Equipment Utilized During Treatment: Gait belt Activity Tolerance: Patient limited by pain Patient left: in chair;with call bell/phone within reach Nurse Communication: Mobility status  GP Functional Limitation: Mobility: Walking and moving around Mobility: Walking and Moving Around Current Status (Z6109): At least 60 percent but less than 80 percent impaired, limited or restricted Mobility: Walking and Moving Around Goal Status [phone]): At least 1 percent but less than 20 percent impaired, limited or restricted   Tamala Ser 05/28/2013, 12:08 PM [phone]

## 2013-05-28 NOTE — Progress Notes (Signed)
Utilization Review completed.  

## 2013-05-28 NOTE — Progress Notes (Signed)
[facility] PROGRESS NOTE  Martin Cain AVW:[id number] DOB: December 12, 1945 DOA: 05/27/2013 PCP: No primary provider on file.  Assessment/Plan: 1.Acute congestive heart failure -  systolic vs diastolic -await echo- continue to monitor on telemetry  - Appreciate cardiology input, lasix restarted at 20 mg daily  - Consult cardiology given complicated cardiac history as well as elevated troponin  - Could be secondary to malignant hypertension  - continue Daily weights, strict I/O's >>diuresing well so far, 2 L neg today 2. elevated troponin  -Per cardiology likely due to elevated creatinine, patient chest pain-free - Cards recommended for patient to follow up with his cardiologist in [facility] for outpatient nuclear study 3. acute gout flare  - On colchicine renally dosed, -  will give short course of steroids and still with increased pain and follow, opioids when necessary -Also obtain x-rays of R. knee and foot 4. renal insufficiency  - Baseline creatinine unknown -Stable this a.m., Lasix and ACE inhibitor restarted on lower dose per cards -continue 5.history of CAD-continue aspirin and statin  -Follow up with cards in [place] as above  6. Leukocytosis -Likely d/t  steroids   Code Status: full code Family Communication: called daughter [phone] and left message Disposition Plan: To home when medically ready   Consultants:  Radiology  Procedures:  Echo-results pending  Antibiotics: none HPI/Subjective: Denies any chest pain, states right knee and foot pain about the same  Objective: Filed Vitals:   05/28/13 0535  BP: 167/83  Pulse: 108  Temp: 97.5 F (36.4 C)  Resp: 20    Intake/Output Summary (Last 24 hours) at 05/28/13 1153 Last data filed at 05/28/13 1100  Gross per 24 hour  Intake 404.17 ml  Output   2475 ml  Net -2070.83 ml   Filed Weights   05/27/13 1345 05/28/13 0535  Weight: 58.968 kg (130 lb) 56.9 kg (125 lb 7.1 oz)    Exam:  General:  Somnolent but easily aroused & oriented x 3 In NAD Cardiovascular: RRR, nl S1 s2 Respiratory: CTAB Abdomen: soft +BS NT/ND, no masses palpable Extremities: Edematous and tender over right knee and R.foot over the metatarsals. No cyanosis   Data Reviewed: Basic Metabolic Panel:  Recent Labs Lab 05/27/13 1005 05/27/13 1638 05/28/13 0432  NA 141  --  141  K 4.3  --  3.7  CL 108  --  104  CO2 20  --  22  GLUCOSE 105*  --  102*  BUN 40*  --  39*  CREATININE 1.97* 1.93* 1.95*  CALCIUM 9.3  --  9.3  MG  --  1.6  --   PHOS  --  3.8  --    Liver Function Tests: No results found for this basename: AST, ALT, ALKPHOS, BILITOT, PROT, ALBUMIN,  in the last 168 hours No results found for this basename: LIPASE, AMYLASE,  in the last 168 hours No results found for this basename: AMMONIA,  in the last 168 hours CBC:  Recent Labs Lab 05/27/13 1005 05/27/13 1638 05/28/13 0432  WBC 10.7* 12.6* 14.6*  NEUTROABS 8.4*  --   --   HGB 11.7* 11.8* 12.9*  HCT 35.7* 36.8* 37.3*  MCV 90.6 90.4 89.0  PLT 228 221 219   Cardiac Enzymes:  Recent Labs Lab 05/27/13 1121 05/27/13 1638 05/27/13 2222 05/28/13 0432  TROPONINI 0.50* 0.42* 0.40* 0.43*   BNP (last 3 results)  Recent Labs  05/27/13 1005  PROBNP 6983.0*   CBG: No results found for this basename: GLUCAP,  in the last 168 hours  No results found for this or any previous visit (from the past 240 hour(s)).   Studies: Dg Chest 2 View  05/27/2013   CLINICAL DATA:  Hypertension, peripheral swelling  EXAM: CHEST  2 VIEW  COMPARISON:  None  FINDINGS: Cardiac shadow is mildly enlarged. Postsurgical changes are seen. The lungs are well aerated bilaterally without focal infiltrate or sizable effusion. No acute bony abnormality is noted.  IMPRESSION: No acute abnormality noted.   Electronically Signed   By: Alcide Clever M.D.   On: 05/27/2013 10:10    Scheduled Meds: . amLODipine  10 mg Oral Daily  . aspirin EC  325 mg Oral Daily  .  atorvastatin  20 mg Oral q1800  . carvedilol  25 mg Oral BID WC  . colchicine  0.6 mg Oral Daily  . furosemide  20 mg Oral Daily  . heparin  5,000 Units Subcutaneous Q8H  . lisinopril  10 mg Oral Daily  . predniSONE  20 mg Oral Q breakfast  . sodium chloride  3 mL Intravenous Q12H  . sodium chloride  3 mL Intravenous Q12H   Continuous Infusions:   Principal Problem:   Acute exacerbation of CHF (congestive heart failure) Active Problems:   Renal insufficiency   Elevated troponin   Gout flare   HTN (hypertension), malignant    Time spent: 35    [facility]  [facility] Pager [phone]. If 7PM-7AM, please contact night-coverage at [email or web address], password [facility] 05/28/2013, 11:53 AM  LOS: 1 day

## 2013-05-29 DIAGNOSIS — G9340 Encephalopathy, unspecified: Secondary | ICD-10-CM

## 2013-05-29 DIAGNOSIS — N289 Disorder of kidney and ureter, unspecified: Secondary | ICD-10-CM

## 2013-05-29 DIAGNOSIS — M109 Gout, unspecified: Secondary | ICD-10-CM

## 2013-05-29 DIAGNOSIS — R7989 Other specified abnormal findings of blood chemistry: Secondary | ICD-10-CM

## 2013-05-29 DIAGNOSIS — I509 Heart failure, unspecified: Principal | ICD-10-CM

## 2013-05-29 DIAGNOSIS — I10 Essential (primary) hypertension: Secondary | ICD-10-CM

## 2013-05-29 LAB — BASIC METABOLIC PANEL
CO2: 17 mEq/L — ABNORMAL LOW (ref 19–32)
Chloride: 102 mEq/L (ref 96–112)
GFR calc non Af Amer: 28 mL/min — ABNORMAL LOW (ref >90)
Potassium: 3.9 mEq/L (ref 3.5–5.1)
Sodium: 134 mEq/L — ABNORMAL LOW (ref 135–145)

## 2013-05-29 MED ORDER — PREDNISONE 20 MG PO TABS
20.0000 mg | ORAL_TABLET | Freq: Every day | ORAL | Status: DC
Start: 2013-05-30 — End: 2013-05-31
  Administered 2013-05-30 – 2013-05-31 (×2): 20 mg via ORAL
  Filled 2013-05-29 (×3): qty 1

## 2013-05-29 MED ORDER — RISPERIDONE 0.5 MG PO TBDP
0.5000 mg | ORAL_TABLET | Freq: Every evening | ORAL | Status: DC | PRN
  Administered 2013-05-29 (×2): 0.5 mg via ORAL
  Filled 2013-05-29 (×4): qty 1

## 2013-05-29 MED ORDER — COLCHICINE 0.6 MG PO TABS
0.3000 mg | ORAL_TABLET | Freq: Every day | ORAL | Status: DC
  Administered 2013-05-30 – 2013-05-31 (×2): 0.3 mg via ORAL
  Filled 2013-05-29 (×2): qty 0.5

## 2013-05-29 MED ORDER — LORAZEPAM 2 MG/ML IJ SOLN
0.5000 mg | Freq: Once | INTRAMUSCULAR | Status: AC
  Administered 2013-05-29: 0.5 mg via INTRAVENOUS
  Filled 2013-05-29: qty 1

## 2013-05-29 NOTE — Progress Notes (Addendum)
    Subjective:  Denies CP or dyspnea; right knee pain improving   Objective:  Filed Vitals:   05/28/13 0535 05/28/13 1317 05/28/13 2026 05/29/13 0509  BP: 167/83 132/57 134/66 134/75  Pulse: 108 89 93 77  Temp: 97.5 F (36.4 C) 98 F (36.7 C) 97.7 F (36.5 C) 97.5 F (36.4 C)  TempSrc: Oral Oral Oral Oral  Resp: 20 20 16 18   Height:      Weight: 125 lb 7.1 oz (56.9 kg)   129 lb 6.6 oz (58.7 kg)  SpO2: 99% 99% 99% 100%    Intake/Output from previous day:  Intake/Output Summary (Last 24 hours) at 05/29/13 0739 Last data filed at 05/29/13 1610  Gross per 24 hour  Intake   1280 ml  Output    800 ml  Net    480 ml    Physical Exam: Physical exam: Well-developed thin in no acute distress.  Skin is warm and dry.  HEENT is normal.  Neck is supple.  Chest is clear to auscultation with normal expansion.  Cardiovascular exam is regular rate and rhythm.  Abdominal exam nontender or distended. No masses palpated. Extremities show trace edema right ankle; right knee effusion mildly improved. neuro grossly intact    Lab Results: Basic Metabolic Panel:  Recent Labs  96/04/54 1005 05/27/13 1638 05/28/13 0432 05/29/13 0438  NA 141  --  141 134*  K 4.3  --  3.7 3.9  CL 108  --  104 102  CO2 20  --  22 17*  GLUCOSE 105*  --  102* 150*  BUN 40*  --  39* 60*  CREATININE 1.97* 1.93* 1.95* 2.26*  CALCIUM 9.3  --  9.3 8.7  MG  --  1.6  --   --   PHOS  --  3.8  --   --    CBC:  Recent Labs  05/27/13 1005 05/27/13 1638 05/28/13 0432  WBC 10.7* 12.6* 14.6*  NEUTROABS 8.4*  --   --   HGB 11.7* 11.8* 12.9*  HCT 35.7* 36.8* 37.3*  MCV 90.6 90.4 89.0  PLT 228 221 219   Cardiac Enzymes:  Recent Labs  05/27/13 1638 05/27/13 2222 05/28/13 0432  TROPONINI 0.42* 0.40* 0.43*     Assessment/Plan:  1 elevated troponin-patient is not having chest pain. No clear trend up. Elevation may be related to renal insufficiency. Patient should fu with his cardiologist in  [place] for outpt nuclear study. 2 severe hypertension-patient's blood pressure improved but renal function worse; DC lisinopril and lasix; continue remaining meds and if BP increases could add hydralazine. 3 congestive heart failure-patient's LV function is mildly reduced. Renal function worse and patient now appears dry; DC lasix. 4 acute on chronic stage III kidney disease-Renal function worse; DC lisinopril and lasix and follow. 5 coronary artery disease-continue aspirin and statin.  6 right knee effusion/gout-management per primary care. Would avoid nonsteroidals if possible given renal insufficiency.   Martin Cain 05/29/2013, 7:39 AM

## 2013-05-29 NOTE — Progress Notes (Signed)
[facility] PROGRESS NOTE  Martin Cain ZOX:[id number] DOB: Feb 27, 1946 DOA: 05/27/2013 PCP: No primary provider on file.  Assessment/Plan: 1.Acute congestive heart failure -  systolic vs diastolic -await echo- continue to monitor on telemetry  - Appreciate cardiology input, lasix and lisinopril dc'ed d/tcreatinine trending up   - Could be secondary to malignant hypertension  - continue Daily weights, strict I/O's  2. elevated troponin  -Per cardiology likely due to elevated creatinine, patient chest pain-free - Cards recommended for patient to follow up with his cardiologist in [facility] for outpatient nuclear study 3. acute gout flare  - On colchicine renally dosed, -   opioids when necessary -X-rays of knee with degenerative changes and small effusion, x-ray of foot with no acute findings -Patient has history of severe right knee arthritis and was to have surgery done in [place] and states he will followup with his physician when he returns to [name]  4. renal insufficiency  - Baseline creatinine unknown -Stable this a.m., Lasix and ACE inhibitor restarted on lower dose per cards -Creatinine trending up this a.m. and Lasix and lisinopril  DC'd as above -Follow and recheck 5.history of CAD-continue aspirin and statin  -Follow up with cards in [place] as above  6. Leukocytosis -Likely d/t  steroids 7. Confusion/acute encephalopathy -Likely steroid induced, will quickly taper prednisone  Code Status: full code Family Communication: called daughter [phone] and left message Disposition Plan: To home when medically ready   Consultants:  Radiology  Procedures:  Echo-results pending  Antibiotics: none HPI/Subjective: States he had been better today, intermittent confusion  Objective: Filed Vitals:   05/29/13 0509  BP: 134/75  Pulse: 77  Temp: 97.5 F (36.4 C)  Resp: 18    Intake/Output Summary (Last 24 hours) at 05/29/13 1151 Last data filed at  05/29/13 0836  Gross per 24 hour  Intake   1400 ml  Output    600 ml  Net    800 ml   Filed Weights   05/27/13 1345 05/28/13 0535 05/29/13 0509  Weight: 58.968 kg (130 lb) 56.9 kg (125 lb 7.1 oz) 58.7 kg (129 lb 6.6 oz)    Exam:  General: Somnolent but easily aroused & oriented x 3 In NAD Cardiovascular: RRR, nl S1 s2 Respiratory: CTAB Abdomen: soft +BS NT/ND, no masses palpable Extremities: Decreased swelling and tenderness over right knee and R.foot over the metatarsals. No cyanosis   Data Reviewed: Basic Metabolic Panel:  Recent Labs Lab 05/27/13 1005 05/27/13 1638 05/28/13 0432 05/29/13 0438  NA 141  --  141 134*  K 4.3  --  3.7 3.9  CL 108  --  104 102  CO2 20  --  22 17*  GLUCOSE 105*  --  102* 150*  BUN 40*  --  39* 60*  CREATININE 1.97* 1.93* 1.95* 2.26*  CALCIUM 9.3  --  9.3 8.7  MG  --  1.6  --   --   PHOS  --  3.8  --   --    Liver Function Tests: No results found for this basename: AST, ALT, ALKPHOS, BILITOT, PROT, ALBUMIN,  in the last 168 hours No results found for this basename: LIPASE, AMYLASE,  in the last 168 hours No results found for this basename: AMMONIA,  in the last 168 hours CBC:  Recent Labs Lab 05/27/13 1005 05/27/13 1638 05/28/13 0432  WBC 10.7* 12.6* 14.6*  NEUTROABS 8.4*  --   --   HGB 11.7* 11.8* 12.9*  HCT 35.7* 36.8* 37.3*  MCV 90.6 90.4 89.0  PLT 228 221 219   Cardiac Enzymes:  Recent Labs Lab 05/27/13 1121 05/27/13 1638 05/27/13 2222 05/28/13 0432  TROPONINI 0.50* 0.42* 0.40* 0.43*   BNP (last 3 results)  Recent Labs  05/27/13 1005  PROBNP 6983.0*   CBG: No results found for this basename: GLUCAP,  in the last 168 hours  No results found for this or any previous visit (from the past 240 hour(s)).   Studies: Dg Knee Complete 4 Views Right  05/28/2013   CLINICAL DATA:  Right knee pain.  EXAM: RIGHT KNEE - COMPLETE 4+ VIEW  COMPARISON:  None.  FINDINGS: The patient was in a flexed position and unable to  straighten his leg.  Moderate tricompartmental degenerative changes are noted.  A small joint effusion is present.  No definite acute bony abnormality noted.  No suspicious focal bony lesions identified.  IMPRESSION: Decrease sensitivity secondary to patient's flexed position.  Moderate tricompartmental degenerative changes with small knee effusion.  No definite acute bony abnormality.   Electronically Signed   By: Laveda Abbe M.D.   On: 05/28/2013 20:18   Dg Foot Complete Right  05/28/2013   CLINICAL DATA:  Foot pain.  Renal insufficiency.  Gout.  EXAM: RIGHT FOOT COMPLETE - 3+ VIEW  COMPARISON:  None.  FINDINGS: Diffuse soft-tissue swelling is present. Mild osteopenia noted throughout the bones of the foot and ankle. This could be related to osteoporosis/ostemalacia/renal osteodystrophy. No acute bony abnormality is identified. No evidence of fracture or dislocation. Mild degenerative changes present about the foot and ankle. No evidence of gouty arthritis.  IMPRESSION: 1. Diffuse soft tissue swelling. 2. Abnormal bone mineralization, possibly related to the patient's renal insufficiency. 3. No acute abnormality identified.  No evidence of gout.   Electronically Signed   By: Maisie Fus  Register   On: 05/28/2013 13:48    Scheduled Meds: . amLODipine  10 mg Oral Daily  . aspirin EC  325 mg Oral Daily  . atorvastatin  20 mg Oral q1800  . carvedilol  25 mg Oral BID WC  . [START ON 05/30/2013] colchicine  0.3 mg Oral Daily  . heparin  5,000 Units Subcutaneous Q8H  . pantoprazole  40 mg Oral QAC breakfast  . predniSONE  40 mg Oral Q breakfast  . sodium chloride  3 mL Intravenous Q12H  . sodium chloride  3 mL Intravenous Q12H   Continuous Infusions:   Principal Problem:   Acute exacerbation of CHF (congestive heart failure) Active Problems:   Renal insufficiency   Elevated troponin   Gout flare   HTN (hypertension), malignant   CHF (congestive heart failure)    Time spent:  35    Tifany Hirsch C  [facility] Pager [phone]. If 7PM-7AM, please contact night-coverage at [email or web address], password [facility] 05/29/2013, 11:51 AM  LOS: 2 days

## 2013-05-30 ENCOUNTER — Inpatient Hospital Stay (HOSPITAL_COMMUNITY): Payer: Medicare HMO

## 2013-05-30 DIAGNOSIS — N179 Acute kidney failure, unspecified: Secondary | ICD-10-CM

## 2013-05-30 DIAGNOSIS — N189 Chronic kidney disease, unspecified: Secondary | ICD-10-CM | POA: Diagnosis present

## 2013-05-30 LAB — BASIC METABOLIC PANEL
CO2: 20 mEq/L (ref 19–32)
Glucose, Bld: 130 mg/dL — ABNORMAL HIGH (ref 70–99)
Potassium: 3.8 mEq/L (ref 3.5–5.1)
Sodium: 138 mEq/L (ref 135–145)

## 2013-05-30 MED ORDER — HYDRALAZINE HCL 25 MG PO TABS
25.0000 mg | ORAL_TABLET | Freq: Three times a day (TID) | ORAL | Status: DC
  Administered 2013-05-30 – 2013-05-31 (×6): 25 mg via ORAL
  Filled 2013-05-30 (×7): qty 1

## 2013-05-30 MED ORDER — HALOPERIDOL LACTATE 5 MG/ML IJ SOLN
0.5000 mg | Freq: Once | INTRAMUSCULAR | Status: AC
  Administered 2013-05-30: 0.5 mg via INTRAVENOUS
  Filled 2013-05-30: qty 1

## 2013-05-30 MED ORDER — RISPERIDONE 1 MG PO TBDP
1.0000 mg | ORAL_TABLET | Freq: Every evening | ORAL | Status: DC | PRN
  Administered 2013-05-30 – 2013-05-31 (×4): 1 mg via ORAL
  Filled 2013-05-30 (×4): qty 1

## 2013-05-30 NOTE — Progress Notes (Signed)
At beginning of shift, patient was given his pain medicine and risperdal. This did not seem to help patient. He was still having visual hallucinations and trying to jump out of the bed. Doctor was notified and new orders were given for a one time dose of Ativan IV. This was given at 2347 and patient has been relaxed or sleeping since then. Will continue to monitor.

## 2013-05-30 NOTE — Progress Notes (Signed)
PT Cancellation Note  Patient Details Name: Martin Cain MRN: [id number] DOB: 1946-06-22   Cancelled Treatment:    Reason Eval/Treat Not Completed: Patient at procedure or test/unavailable-going for MRI at this time per RN. Will check back tomorrow.    Rebeca Alert, MPT Pager: [phone]

## 2013-05-30 NOTE — Progress Notes (Signed)
TELEMETRY: Reviewed telemetry pt in NSR: Filed Vitals:   05/29/13 0509 05/29/13 1820 05/29/13 2050 05/30/13 0554  BP: 134/75 146/54 157/59 157/75  Pulse: 77 83 81 80  Temp: 97.5 F (36.4 C) 98 F (36.7 C) 98.4 F (36.9 C) 97.5 F (36.4 C)  TempSrc: Oral Oral Oral Oral  Resp: 18 16 16 16   Height:      Weight: 129 lb 6.6 oz (58.7 kg)   119 lb 7.8 oz (54.2 kg)  SpO2: 100% 99% 97% 100%    Intake/Output Summary (Last 24 hours) at 05/30/13 0744 Last data filed at 05/30/13 0600  Gross per 24 hour  Intake    820 ml  Output   1555 ml  Net   -735 ml    SUBJECTIVE Patient very sedated after receiving ativan. Nursing staff reports increased agitation last night with hallucinations and trying to get out of bed.  LABS: Basic Metabolic Panel:  Recent Labs  16/10/96 1005 05/27/13 1638  05/29/13 0438 05/30/13 0450  NA 141  --   < > 134* 138  K 4.3  --   < > 3.9 3.8  CL 108  --   < > 102 103  CO2 20  --   < > 17* 20  GLUCOSE 105*  --   < > 150* 130*  BUN 40*  --   < > 60* 72*  CREATININE 1.97* 1.93*  < > 2.26* 2.16*  CALCIUM 9.3  --   < > 8.7 8.6  MG  --  1.6  --   --   --   PHOS  --  3.8  --   --   --   < > = values in this interval not displayed. CBC:  Recent Labs  05/27/13 1005 05/27/13 1638 05/28/13 0432  WBC 10.7* 12.6* 14.6*  NEUTROABS 8.4*  --   --   HGB 11.7* 11.8* 12.9*  HCT 35.7* 36.8* 37.3*  MCV 90.6 90.4 89.0  PLT 228 221 219   Cardiac Enzymes:  Recent Labs  05/27/13 1638 05/27/13 2222 05/28/13 0432  TROPONINI 0.42* 0.40* 0.43*   BNP: 6983 Thyroid Function Tests:  Recent Labs  05/27/13 1638  TSH 0.623     Radiology/Studies:  Dg Chest 2 View  05/27/2013   CLINICAL DATA:  Hypertension, peripheral swelling  EXAM: CHEST  2 VIEW  COMPARISON:  None  FINDINGS: Cardiac shadow is mildly enlarged. Postsurgical changes are seen. The lungs are well aerated bilaterally without focal infiltrate or sizable effusion. No acute bony abnormality is  noted.  IMPRESSION: No acute abnormality noted.   Electronically Signed   By: Alcide Clever M.D.   On: 05/27/2013 10:10   Dg Knee Complete 4 Views Right  05/28/2013   CLINICAL DATA:  Right knee pain.  EXAM: RIGHT KNEE - COMPLETE 4+ VIEW  COMPARISON:  None.  FINDINGS: The patient was in a flexed position and unable to straighten his leg.  Moderate tricompartmental degenerative changes are noted.  A small joint effusion is present.  No definite acute bony abnormality noted.  No suspicious focal bony lesions identified.  IMPRESSION: Decrease sensitivity secondary to patient's flexed position.  Moderate tricompartmental degenerative changes with small knee effusion.  No definite acute bony abnormality.   Electronically Signed   By: Laveda Abbe M.D.   On: 05/28/2013 20:18   Dg Foot Complete Right  05/28/2013   CLINICAL DATA:  Foot pain.  Renal insufficiency.  Gout.  EXAM: RIGHT FOOT COMPLETE -  3+ VIEW  COMPARISON:  None.  FINDINGS: Diffuse soft-tissue swelling is present. Mild osteopenia noted throughout the bones of the foot and ankle. This could be related to osteoporosis/ostemalacia/renal osteodystrophy. No acute bony abnormality is identified. No evidence of fracture or dislocation. Mild degenerative changes present about the foot and ankle. No evidence of gouty arthritis.  IMPRESSION: 1. Diffuse soft tissue swelling. 2. Abnormal bone mineralization, possibly related to the patient's renal insufficiency. 3. No acute abnormality identified.  No evidence of gout.   Electronically Signed   By: Maisie Fus  Register   On: 05/28/2013 13:48   Ecg: NSR, LAD, LVH with repolarization abnormality.  PHYSICAL EXAM General: Thin, frail, hand restraints in place. Head: Normal. Neck: Negative for carotid bruits. JVD not elevated. Lungs: Clear bilaterally to auscultation without wheezes, rales, or rhonchi. Breathing is unlabored. Heart: RRR S1 S2 without murmurs, rubs, or gallops.  Abdomen: Soft, non-tender, non-distended  with normoactive bowel sounds. No hepatomegaly. Extremities: No clubbing, cyanosis or edema.  Distal pedal pulses are 2+ and equal bilaterally. Neuro: sedated   ASSESSMENT AND PLAN: 1. Elevated troponin with flat curve. This is most likely related to malignant hypertension and acute on chronic renal failure. No active chest pain. No further inpatient work up planned. Recommend follow up with cardiologist in [facility] for outpatient nuclear study. Echo pending. 2. Malignant Hypertension. BP increased with holding ACEi. Would add hydralazine. 3. Acute on chronic renal failure. Creatinine is slightly better. Urine output is good. 4. CAD on ASA and statin. 5. Gout right knee. On colchicine. 6. Acute delirium. Per primary team.   Principal Problem:   Acute exacerbation of CHF (congestive heart failure) Active Problems:   Renal insufficiency   Elevated troponin   Gout flare   HTN (hypertension), malignant   CHF (congestive heart failure)    Signed, Briya Lookabaugh [place] MD,FACC 05/30/2013 7:53 AM

## 2013-05-30 NOTE — Progress Notes (Addendum)
[facility] PROGRESS NOTE  Martin Cain ZOX:[id number] DOB: [birth year] DOA: 05/27/2013 PCP: No primary provider on file.  Assessment/Plan: 1.Acute congestive heart failure -  systolic vs diastolic -await echo- continue to monitor on telemetry  - Appreciate cardiology input, lasix and lisinopril dc'ed d/tcreatinine trending up   - Could be secondary to malignant hypertension  - po hydralazine added per cards today 12/1, follow  - continue Daily weights, strict I/O's  2. elevated troponin  -Per cardiology likely due to elevated creatinine, patient chest pain-free - Cards recommended for patient to follow up with his cardiologist in [facility] for outpatient nuclear study 3. acute gout flare  - On colchicine renally dosed,tapering prednisone -   opioids when necessary -X-rays of knee with degenerative changes and small effusion, x-ray of foot with no acute findings -Patient has history of severe right knee arthritis and was to have surgery done in [place] and states he will followup with his physician when he returns to [facility] 4. renal insufficiency  - Baseline creatinine unknown -Stable this a.m., Lasix and ACE inhibitor restarted on lower dose per cards -Creatinine trending down.off Lasix and lisinopril  (DC'd 11/30), BUN up d/t steroids -Follow and recheck 5.history of CAD-continue aspirin and statin  -Follow up with cards in [place] as above  6. Leukocytosis -Likely d/t  steroids 7. Confusion/acute encephalopathy  -Likely meds>> steroids, morphine, aand ativan also. will quickly taper prednisone -d/c morphine, and avoid ativan -I have increased ativan dose -obtain MRI to further eval  Code Status: full code Family Communication: called daughter [phone] and left message Disposition Plan: To home when medically ready   Consultants:  Radiology  Procedures:  Echo-still pending  Antibiotics: none HPI/Subjective: Alert and oriented x2, still with intermittent  confusion. Pulling on iv and foley last pm and requiring sitter in room. Pt states still R. Knee and foot pain still there but better. Per family pt leaning more to the right.  Objective: Filed Vitals:   05/30/13 0554  BP: 157/75  Pulse: 80  Temp: 97.5 F (36.4 C)  Resp: 16    Intake/Output Summary (Last 24 hours) at 05/30/13 1338 Last data filed at 05/30/13 1250  Gross per 24 hour  Intake   1180 ml  Output   2105 ml  Net   -925 ml   Filed Weights   05/28/13 0535 05/29/13 0509 05/30/13 0554  Weight: 56.9 kg (125 lb 7.1 oz) 58.7 kg (129 lb 6.6 oz) 54.2 kg (119 lb 7.8 oz)    Exam:  General: alert & oriented x 2 In NAD Cardiovascular: RRR, nl S1 s2 Respiratory: CTAB Abdomen: soft +BS NT/ND, no masses palpable Extremities: Decreased swelling and tenderness over right knee and R.foot over the metatarsals. No cyanosis.  Neuro: alert and oriented x3, non focal, sensory grossly intact   Data Reviewed: Basic Metabolic Panel:  Recent Labs Lab 05/27/13 1005 05/27/13 1638 05/28/13 0432 05/29/13 0438 05/30/13 0450  NA 141  --  141 134* 138  K 4.3  --  3.7 3.9 3.8  CL 108  --  104 102 103  CO2 20  --  22 17* 20  GLUCOSE 105*  --  102* 150* 130*  BUN 40*  --  39* 60* 72*  CREATININE 1.97* 1.93* 1.95* 2.26* 2.16*  CALCIUM 9.3  --  9.3 8.7 8.6  MG  --  1.6  --   --   --   PHOS  --  3.8  --   --   --  Liver Function Tests: No results found for this basename: AST, ALT, ALKPHOS, BILITOT, PROT, ALBUMIN,  in the last 168 hours No results found for this basename: LIPASE, AMYLASE,  in the last 168 hours No results found for this basename: AMMONIA,  in the last 168 hours CBC:  Recent Labs Lab 05/27/13 1005 05/27/13 1638 05/28/13 0432  WBC 10.7* 12.6* 14.6*  NEUTROABS 8.4*  --   --   HGB 11.7* 11.8* 12.9*  HCT 35.7* 36.8* 37.3*  MCV 90.6 90.4 89.0  PLT 228 221 219   Cardiac Enzymes:  Recent Labs Lab 05/27/13 1121 05/27/13 1638 05/27/13 2222 05/28/13 0432   TROPONINI 0.50* 0.42* 0.40* 0.43*   BNP (last 3 results)  Recent Labs  05/27/13 1005  PROBNP 6983.0*   CBG: No results found for this basename: GLUCAP,  in the last 168 hours  No results found for this or any previous visit (from the past 240 hour(s)).   Studies: Dg Knee Complete 4 Views Right  05/28/2013   CLINICAL DATA:  Right knee pain.  EXAM: RIGHT KNEE - COMPLETE 4+ VIEW  COMPARISON:  None.  FINDINGS: The patient was in a flexed position and unable to straighten his leg.  Moderate tricompartmental degenerative changes are noted.  A small joint effusion is present.  No definite acute bony abnormality noted.  No suspicious focal bony lesions identified.  IMPRESSION: Decrease sensitivity secondary to patient's flexed position.  Moderate tricompartmental degenerative changes with small knee effusion.  No definite acute bony abnormality.   Electronically Signed   By: Laveda Abbe M.D.   On: 05/28/2013 20:18   Dg Foot Complete Right  05/28/2013   CLINICAL DATA:  Foot pain.  Renal insufficiency.  Gout.  EXAM: RIGHT FOOT COMPLETE - 3+ VIEW  COMPARISON:  None.  FINDINGS: Diffuse soft-tissue swelling is present. Mild osteopenia noted throughout the bones of the foot and ankle. This could be related to osteoporosis/ostemalacia/renal osteodystrophy. No acute bony abnormality is identified. No evidence of fracture or dislocation. Mild degenerative changes present about the foot and ankle. No evidence of gouty arthritis.  IMPRESSION: 1. Diffuse soft tissue swelling. 2. Abnormal bone mineralization, possibly related to the patient's renal insufficiency. 3. No acute abnormality identified.  No evidence of gout.   Electronically Signed   By: Maisie Fus  Register   On: 05/28/2013 13:48    Scheduled Meds: . amLODipine  10 mg Oral Daily  . aspirin EC  325 mg Oral Daily  . atorvastatin  20 mg Oral q1800  . carvedilol  25 mg Oral BID WC  . colchicine  0.3 mg Oral Daily  . heparin  5,000 Units Subcutaneous Q8H   . hydrALAZINE  25 mg Oral Q8H  . pantoprazole  40 mg Oral QAC breakfast  . predniSONE  20 mg Oral Q breakfast  . risperiDONE  0.5 mg Oral QHS,MR X 1  . sodium chloride  3 mL Intravenous Q12H  . sodium chloride  3 mL Intravenous Q12H   Continuous Infusions:   Principal Problem:   Acute exacerbation of CHF (congestive heart failure) Active Problems:   Renal insufficiency   Elevated troponin   Gout flare   HTN (hypertension), malignant   CHF (congestive heart failure)   Acute on chronic renal failure    Time spent: 35    [facility]  [facility] Pager [phone]. If 7PM-7AM, please contact night-coverage at [email or web address], password [facility] 05/30/2013, 1:38 PM  LOS: 3 days

## 2013-05-31 LAB — BASIC METABOLIC PANEL
CO2: 22 mEq/L (ref 19–32)
Calcium: 8.8 mg/dL (ref 8.4–10.5)
Chloride: 108 mEq/L (ref 96–112)
GFR calc Af Amer: 32 mL/min — ABNORMAL LOW (ref >90)
Sodium: 142 mEq/L (ref 135–145)

## 2013-05-31 MED ORDER — HYDRALAZINE HCL 25 MG PO TABS: 25.0000 mg | ORAL_TABLET | Freq: Three times a day (TID) | ORAL | Status: DC

## 2013-05-31 MED ORDER — RISPERIDONE 1 MG PO TBDP: 1.0000 mg | ORAL_TABLET | Freq: Every evening | ORAL | Status: DC

## 2013-05-31 MED ORDER — OXYCODONE HCL 5 MG PO TABS: 5.0000 mg | ORAL_TABLET | ORAL | Status: DC | PRN

## 2013-05-31 MED ORDER — PREDNISONE 20 MG PO TABS: 20.0000 mg | ORAL_TABLET | Freq: Every day | ORAL | Status: DC

## 2013-05-31 MED ORDER — OMEPRAZOLE 40 MG PO CPDR: 40.0000 mg | DELAYED_RELEASE_CAPSULE | Freq: Every day | ORAL | Status: DC

## 2013-05-31 MED ORDER — COLCHICINE 0.6 MG PO TABS: 0.3000 mg | ORAL_TABLET | Freq: Every day | ORAL | Status: DC

## 2013-05-31 NOTE — Progress Notes (Signed)
Spoke with pt concerning home health, he had no preferences. Referral given to [facility] in house rep. Pt was ok with [facility].

## 2013-05-31 NOTE — Progress Notes (Signed)
Discharged home with ambulance.

## 2013-05-31 NOTE — Progress Notes (Signed)
Pt's daughter, [name] visited with pt last night.  This RN gave the daughter the pt's watch, necklace with pendant and one earring all in one plastic bag.  Pt also wearing one ring on finger.  Pt's daughter aware of this and took pt's valuables home with her.

## 2013-05-31 NOTE — Progress Notes (Signed)
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 Who presents to the emergency department complaining of right great toe as well as right knee pain. The patient is accompanied by his daughter which she is visiting for the holidays. Patient lives in [place]. He denies any history of congestive heart failure or kidney disease. He denies any chest discomfort. States that the knee pain right big toe pain had been present the last few days has been progressively getting worse and nothing he is aware of makes it better. He denies any recent falls or trauma to the area,   PT Comments   Assisted pt OOB to amb limited distance 2nd MAX c/o R knee pain with difficulty fully WB and with c/o knee "crunching".   Follow Up Recommendations  Home health PT     Does the patient have the potential to tolerate intense rehabilitation     Barriers to Discharge        Equipment Recommendations  None recommended by PT    Recommendations for Other Services    Frequency Min 3X/week   Progress towards PT Goals Progress towards PT goals: Progressing toward goals  Plan      Precautions / Restrictions Precautions Precautions: Fall Restrictions Weight Bearing Restrictions: No    Pertinent Vitals/Pain C/o "alot"    Mobility  Bed Mobility Bed Mobility: Supine to Sit Supine to Sit: 4: Min assist;HOB elevated Details for Bed Mobility Assistance: assist to support RLE plus increased time Transfers Transfers: Sit to Stand;Stand to Sit Sit to Stand: 4: Min assist;From bed Stand to Sit: 4: Min assist;To chair/3-in-1 Details for Transfer Assistance: 50% VC's on proper tech and hand placement plus increased  time Ambulation/Gait Ambulation/Gait Assistance: 2: Max Environmental consultant (Feet): 12 Feet Assistive device: Rolling walker Ambulation/Gait Assistance Details: 50% VC's on proper walker to self distance and increased time as pt had great difficulty WBing thru R LE due to knee pain Gait Pattern: Step-to pattern;Decreased step length - right;Decreased step length - left;Trunk flexed Gait velocity: decreased     PT Goals (current goals can now be found in the care plan section)    Visit Information  Last PT Received On: 05/31/13 Assistance Needed: +1 History of Present Illness: With history of coronary artery disease status post CABG 4 vessel, gout, and hypertension. Who presents to the emergency department complaining of right great toe as well as right knee pain. The patient is accompanied by his daughter which she is visiting for the holidays. Patient lives in [place]. He denies any history of congestive heart failure or kidney disease. He denies any chest discomfort. States that the knee pain right big toe pain had been present the last few days has been progressively getting worse and nothing he is aware of makes it better. He denies any recent falls or trauma to the area,    Subjective Data      Cognition       Balance      End of Session PT - End of Session Equipment Utilized During Treatment: Gait belt Activity Tolerance: Patient limited by pain Patient left: in chair;with call bell/phone within reach;with nursing/sitter in room   Felecia Shelling  PTA [facility]  Acute  Rehab Pager  [phone] 

## 2013-05-31 NOTE — Progress Notes (Signed)
TELEMETRY: Reviewed telemetry pt in [facility]: Filed Vitals:   05/29/13 2050 05/30/13 0554 05/30/13 1343 05/30/13 2112  BP: 157/59 157/75 147/53 145/54  Pulse: 81 80 74 77  Temp: 98.4 F (36.9 C) 97.5 F (36.4 C) 98.1 F (36.7 C) 98.5 F (36.9 C)  TempSrc: Oral Oral Oral Oral  Resp: 16 16 18 16   Height:      Weight:  119 lb 7.8 oz (54.2 kg)    SpO2: 97% 100% 100% 98%    Intake/Output Summary (Last 24 hours) at 05/31/13 1610 Last data filed at 05/30/13 2300  Gross per 24 hour  Intake    720 ml  Output    550 ml  Net    170 ml    SUBJECTIVE Patient mentally much clearer today. Denies chest pain or SOB. Biggest complaint is of leg pain- this is improved with oxycontin.  LABS: Basic Metabolic Panel:  Recent Labs  96/04/54 0450 05/31/13 0554  NA 138 142  K 3.8 3.6  CL 103 108  CO2 20 22  GLUCOSE 130* 115*  BUN 72* 74*  CREATININE 2.16* 2.29*  CALCIUM 8.6 8.8   BNP: 6983   Radiology/Studies:  Dg Chest 2 View  05/27/2013   CLINICAL DATA:  Hypertension, peripheral swelling  EXAM: CHEST  2 VIEW  COMPARISON:  None  FINDINGS: Cardiac shadow is mildly enlarged. Postsurgical changes are seen. The lungs are well aerated bilaterally without focal infiltrate or sizable effusion. No acute bony abnormality is noted.  IMPRESSION: No acute abnormality noted.   Electronically Signed   By: Alcide Clever M.D.   On: 05/27/2013 10:10   MRI HEAD WITHOUT CONTRAST  TECHNIQUE:  Multiplanar, multiecho pulse sequences of the brain and surrounding  structures were obtained without intravenous contrast.  COMPARISON: None.  FINDINGS:  Exam is motion degraded.  No acute infarct.  No intracranial hemorrhage.  Remote small right basal ganglia/thalamic infarct. Remote tiny right  paracentral pontine infarct. Remote tiny left cerebellar infarct.  Mild small vessel disease type changes.  Atrophy. Ventricular prominence felt to be related to atrophy rather  than hydrocephalus.  No intracranial  mass lesion noted on this unenhanced exam.  Small right vertebral artery with superimposed atherosclerotic type  changes and significant narrowing. Left vertebral artery, basilar  artery and the internal carotid arteries as well as major dural  sinuses are patent.  Minimal spinal stenosis C3-4. Cervical medullary junction, pituitary  region, pineal region and orbital structures unremarkable.  Minimal paranasal sinus mucosal thickening.  IMPRESSION:  Exam is motion degraded.  No acute infarct.  Remote small right basal ganglia/thalamic infarct. Remote tiny right  paracentral pontine infarct. Remote tiny left cerebellar infarct.  Mild small vessel disease type changes.  Atrophy.  Small right vertebral artery with superimposed atherosclerotic type  changes and significant narrowing.  Electronically Signed  By: Bridgett Larsson M.D.  On: 05/30/2013 17:15   Ecg: NSR, LAD, LVH with repolarization abnormality.  PHYSICAL EXAM General: Thin, frail, alert Head: Normal. Neck: Negative for carotid bruits. JVD not elevated. Lungs: Clear bilaterally to auscultation without wheezes, rales, or rhonchi. Breathing is unlabored. Heart: RRR S1 S2 without murmurs, rubs, or gallops.  Abdomen: Soft, non-tender, non-distended with normoactive bowel sounds. No hepatomegaly. Extremities: No clubbing, cyanosis or edema.  Distal pedal pulses are 2+ and equal bilaterally. Neuro: alert oriented x 3.   ASSESSMENT AND PLAN: 1. Elevated troponin with flat curve. This is most likely related to malignant hypertension and acute on chronic renal failure.  No active chest pain. No further inpatient work up planned. Recommend follow up with cardiologist in [facility] for outpatient nuclear study. Echo pending. 2. Malignant Hypertension. BP improved today. Continue current Rx. 3. Acute on chronic renal failure. Creatinine is slightly higher. Urine output is good. ACEi and diuretics on hold. 4. CAD on ASA and statin. 5. Gout  right knee. On colchicine. 6. Acute delirium. Improved.   Principal Problem:   Acute exacerbation of CHF (congestive heart failure) Active Problems:   Renal insufficiency   Elevated troponin   Gout flare   HTN (hypertension), malignant   CHF (congestive heart failure)   Acute on chronic renal failure    Signed, Peter [place] MD,FACC 05/31/2013 6:52 AM

## 2013-05-31 NOTE — Discharge Summary (Signed)
Physician Discharge Summary  [name] WJX:[id number] DOB: 03/03/1946 DOA: 05/27/2013  PCP: No primary provider on file.  Admit date: 05/27/2013 Discharge date: 05/31/2013  Time spent: >30 minutes  Recommendations for Outpatient Follow-up:  Follow-up Information   Please follow up. (PCP and  Cardiologist in 1-2wks, call for appt upon discharge)        Discharge Diagnoses:  Principal Problem:   Acute exacerbation of CHF (congestive heart failure) Active Problems:   Renal insufficiency   Elevated troponin   Gout flare   HTN (hypertension), malignant   CHF (congestive heart failure)   Acute on chronic renal failure   Discharge Condition: improved/stable  Diet recommendation: heart healthy  Filed Weights   05/28/13 0535 05/29/13 0509 05/30/13 0554  Weight: 56.9 kg (125 lb 7.1 oz) 58.7 kg (129 lb 6.6 oz) 54.2 kg (119 lb 7.8 oz)    History of present illness:  Martin Cain is a 67 y.o. male  With history of coronary artery disease status post CABG 4 vessel, gout, and hypertension. Who presents to the emergency department complaining of right great toe as well as right knee pain. The patient is accompanied by his daughter which she is visiting for the holidays. Patient lives in [place]. He denies any history of congestive heart failure or kidney disease. He denies any chest discomfort. States that the knee pain right big toe pain had been present the last few days has been progressively getting worse and nothing he is aware of makes it better. He denies any recent falls or trauma to the area,  In the emergency department patient was found to have an elevated creatinine to 1.9, elevated troponin, and elevated BNP at 6983. Given findings we will consult for further admission and evaluation.   Hospital Course:  1.Acute congestive heart failure  - systolic vs diastolic -await echo- continue to monitor on telemetry  - Appreciate cardiology input, lasix and lisinopril dc'ed  d/tcreatinine trending up  - Could be secondary to malignant hypertension  - po hydralazine added per cards on  12/1, follow  - His fluid status was closely monitored and he had no sob, and clinically appeared compensated off the lasix. He is to follow up with his cardiologist upon d/c. 2. elevated troponin  -Per cardiology likely due to elevated creatinine, patient chest pain-free  - Cards recommended for patient to follow up with his cardiologist in [facility] for outpatient nuclear study  3. Presumed acute gout flare  -pt was treated with colchicine renally dosed,and steroids which were quickly tapered.  - he was also placed on opioids when necessary  -X-rays of knee with degenerative changes and small effusion, x-ray of foot with no acute findings  -Patient has history of severe right knee arthritis and was to have surgery done in [place] and states he will followup with his physician when he returns to [place]>> he was instructed to follow up with his MD in [place] who was planning the knee replacement- this was discussed with daughter as well and they agreed with the plan. His pain was clinically improved with the above management. PT saw pt and recommended HH PT. 4. renal insufficiency  - Baseline creatinine unknown  - Lasix and ACE inhibitor were initially held on admission, but on follow up restarted on lower dose per cards. But on recheck the next day cr was again trending up and so the lasix and ACE were dc'ed. His Cr on dc was 2.29 pt was to follow up with PCP  on d/c  And instructed to stay off lasix and ACE. 5.history of CAD-continue aspirin and statin  -Follow up with cards in [place] as above  6. Leukocytosis  -Likely d/t steroids  7. Confusion/acute encephalopathy  -Likely meds>> steroids, morphine, and ativan also. will quickly taper prednisone  Pt was also placed on risperidone, ativan, morphine dc'ed and he improved clinically -MRI was obtained to eval and was neg for acute  findings.   Procedures:  Echo- EF 40-45%   Consultations:  cardiology  Discharge Exam: Filed Vitals:   05/31/13 0649  BP: 169/63  Pulse: 71  Temp: 97.8 F (36.6 C)  Resp: 16   Exam:  General: alert & oriented x 2 In NAD  Cardiovascular: RRR, nl S1 s2  Respiratory: CTAB  Abdomen: soft +BS NT/ND, no masses palpable  Extremities: Decreased swelling and tenderness over right knee and R.foot over the metatarsals. No cyanosis. Neuro: alert and oriented x3, non focal, sensory grossly intact   Discharge Instructions  Discharge Orders   Future Orders Complete By Expires   Diet - low sodium heart healthy  As directed    Increase activity slowly  As directed        Medication List    STOP taking these medications       furosemide 40 MG tablet  Commonly known as:  LASIX     ibuprofen 800 MG tablet  Commonly known as:  ADVIL,MOTRIN     lisinopril 40 MG tablet  Commonly known as:  PRINIVIL,ZESTRIL      TAKE these medications       amLODipine 10 MG tablet  Commonly known as:  NORVASC  Take 10 mg by mouth daily.     carvedilol 25 MG tablet  Commonly known as:  COREG  Take 25 mg by mouth 2 (two) times daily with a meal.     colchicine 0.6 MG tablet  Take 0.5 tablets (0.3 mg total) by mouth daily.     hydrALAZINE 25 MG tablet  Commonly known as:  APRESOLINE  Take 1 tablet (25 mg total) by mouth every 8 (eight) hours.     nitroGLYCERIN 0.4 MG SL tablet  Commonly known as:  NITROSTAT  Place 0.4 mg under the tongue every 5 (five) minutes as needed for chest pain.     omeprazole 40 MG capsule  Commonly known as:  PRILOSEC  Take 1 capsule (40 mg total) by mouth daily.     oxyCODONE 5 MG immediate release tablet  Commonly known as:  Oxy IR/ROXICODONE  Take 1 tablet (5 mg total) by mouth every 4 (four) hours as needed for moderate pain.     predniSONE 20 MG tablet  Commonly known as:  DELTASONE  Take 1 tablet (20 mg total) by mouth daily with breakfast.      risperiDONE 1 MG disintegrating tablet  Commonly known as:  RISPERDAL M-TABS  Take 1 tablet (1 mg total) by mouth every evening.     rosuvastatin 10 MG tablet  Commonly known as:  CRESTOR  Take 10 mg by mouth daily.       No Known Allergies     Follow-up Information   Please follow up. (PCP and  Cardiologist in 1-2wks, call for appt upon discharge)        The results of significant diagnostics from this hospitalization (including imaging, microbiology, ancillary and laboratory) are listed below for reference.    Significant Diagnostic Studies: Dg Chest 2 View  05/27/2013   CLINICAL  DATA:  Hypertension, peripheral swelling  EXAM: CHEST  2 VIEW  COMPARISON:  None  FINDINGS: Cardiac shadow is mildly enlarged. Postsurgical changes are seen. The lungs are well aerated bilaterally without focal infiltrate or sizable effusion. No acute bony abnormality is noted.  IMPRESSION: No acute abnormality noted.   Electronically Signed   By: Alcide Clever M.D.   On: 05/27/2013 10:10   Mr Brain Wo Contrast  05/30/2013   CLINICAL DATA:  Altered mental status. Confusion. High blood pressure and hyperlipidemia. Renal insufficiency.  EXAM: MRI HEAD WITHOUT CONTRAST  TECHNIQUE: Multiplanar, multiecho pulse sequences of the brain and surrounding structures were obtained without intravenous contrast.  COMPARISON:  None.  FINDINGS: Exam is motion degraded.  No acute infarct.  No intracranial hemorrhage.  Remote small right basal ganglia/thalamic infarct. Remote tiny right paracentral pontine infarct. Remote tiny left cerebellar infarct.  Mild small vessel disease type changes.  Atrophy. Ventricular prominence felt to be related to atrophy rather than hydrocephalus.  No intracranial mass lesion noted on this unenhanced exam.  Small right vertebral artery with superimposed atherosclerotic type changes and significant narrowing. Left vertebral artery, basilar artery and the internal carotid arteries as well as major  dural sinuses are patent.  Minimal spinal stenosis C3-4. Cervical medullary junction, pituitary region, pineal region and orbital structures unremarkable.  Minimal paranasal sinus mucosal thickening.  IMPRESSION: Exam is motion degraded.  No acute infarct.  Remote small right basal ganglia/thalamic infarct. Remote tiny right paracentral pontine infarct. Remote tiny left cerebellar infarct.  Mild small vessel disease type changes.  Atrophy.  Small right vertebral artery with superimposed atherosclerotic type changes and significant narrowing.   Electronically Signed   By: Bridgett Larsson M.D.   On: 05/30/2013 17:15   Dg Knee Complete 4 Views Right  05/28/2013   CLINICAL DATA:  Right knee pain.  EXAM: RIGHT KNEE - COMPLETE 4+ VIEW  COMPARISON:  None.  FINDINGS: The patient was in a flexed position and unable to straighten his leg.  Moderate tricompartmental degenerative changes are noted.  A small joint effusion is present.  No definite acute bony abnormality noted.  No suspicious focal bony lesions identified.  IMPRESSION: Decrease sensitivity secondary to patient's flexed position.  Moderate tricompartmental degenerative changes with small knee effusion.  No definite acute bony abnormality.   Electronically Signed   By: Laveda Abbe M.D.   On: 05/28/2013 20:18   Dg Foot Complete Right  05/28/2013   CLINICAL DATA:  Foot pain.  Renal insufficiency.  Gout.  EXAM: RIGHT FOOT COMPLETE - 3+ VIEW  COMPARISON:  None.  FINDINGS: Diffuse soft-tissue swelling is present. Mild osteopenia noted throughout the bones of the foot and ankle. This could be related to osteoporosis/ostemalacia/renal osteodystrophy. No acute bony abnormality is identified. No evidence of fracture or dislocation. Mild degenerative changes present about the foot and ankle. No evidence of gouty arthritis.  IMPRESSION: 1. Diffuse soft tissue swelling. 2. Abnormal bone mineralization, possibly related to the patient's renal insufficiency. 3. No acute  abnormality identified.  No evidence of gout.   Electronically Signed   By: Maisie Fus  Register   On: 05/28/2013 13:48    Microbiology: No results found for this or any previous visit (from the past 240 hour(s)).   Labs: Basic Metabolic Panel:  Recent Labs Lab 05/27/13 1005 05/27/13 1638 05/28/13 0432 05/29/13 0438 05/30/13 0450 05/31/13 0554  NA 141  --  141 134* 138 142  K 4.3  --  3.7 3.9 3.8 3.6  CL 108  --  104 102 103 108  CO2 20  --  22 17* 20 22  GLUCOSE 105*  --  102* 150* 130* 115*  BUN 40*  --  39* 60* 72* 74*  CREATININE 1.97* 1.93* 1.95* 2.26* 2.16* 2.29*  CALCIUM 9.3  --  9.3 8.7 8.6 8.8  MG  --  1.6  --   --   --   --   PHOS  --  3.8  --   --   --   --    Liver Function Tests: No results found for this basename: AST, ALT, ALKPHOS, BILITOT, PROT, ALBUMIN,  in the last 168 hours No results found for this basename: LIPASE, AMYLASE,  in the last 168 hours No results found for this basename: AMMONIA,  in the last 168 hours CBC:  Recent Labs Lab 05/27/13 1005 05/27/13 1638 05/28/13 0432  WBC 10.7* 12.6* 14.6*  NEUTROABS 8.4*  --   --   HGB 11.7* 11.8* 12.9*  HCT 35.7* 36.8* 37.3*  MCV 90.6 90.4 89.0  PLT 228 221 219   Cardiac Enzymes:  Recent Labs Lab 05/27/13 1121 05/27/13 1638 05/27/13 2222 05/28/13 0432  TROPONINI 0.50* 0.42* 0.40* 0.43*   BNP: BNP (last 3 results)  Recent Labs  05/27/13 1005  PROBNP 6983.0*   CBG: No results found for this basename: GLUCAP,  in the last 168 hours     Signed:  Falicia Lizotte C  [facility] 05/31/2013, 2:20 PM

## 2013-06-01 NOTE — Progress Notes (Signed)
A call from pt's daughter [phone] revealed that pt needed DME- RW and 3 in one. Order placed and given to [facility] in house rep.

## 2013-06-09 ENCOUNTER — Telehealth: Payer: Self-pay

## 2013-06-09 ENCOUNTER — Emergency Department (HOSPITAL_COMMUNITY): Payer: Medicare HMO

## 2013-06-09 ENCOUNTER — Encounter (HOSPITAL_COMMUNITY): Payer: Self-pay | Admitting: Emergency Medicine

## 2013-06-09 ENCOUNTER — Emergency Department (HOSPITAL_COMMUNITY)
Admission: EM | Admit: 2013-06-09 | Discharge: 2013-06-09 | Disposition: A | Discharge status: Home / Self Care | Payer: Medicare HMO | Attending: Emergency Medicine | Admitting: Emergency Medicine

## 2013-06-09 ENCOUNTER — Ambulatory Visit (INDEPENDENT_AMBULATORY_CARE_PROVIDER_SITE_OTHER): Payer: Medicare HMO | Admitting: Cardiology

## 2013-06-09 VITALS — BP 174/75 | HR 91

## 2013-06-09 DIAGNOSIS — IMO0002 Reserved for concepts with insufficient information to code with codable children: Secondary | ICD-10-CM | POA: Insufficient documentation

## 2013-06-09 DIAGNOSIS — Z87448 Personal history of other diseases of urinary system: Secondary | ICD-10-CM | POA: Insufficient documentation

## 2013-06-09 DIAGNOSIS — M25569 Pain in unspecified knee: Secondary | ICD-10-CM | POA: Insufficient documentation

## 2013-06-09 DIAGNOSIS — M109 Gout, unspecified: Secondary | ICD-10-CM | POA: Insufficient documentation

## 2013-06-09 DIAGNOSIS — I2510 Atherosclerotic heart disease of native coronary artery without angina pectoris: Secondary | ICD-10-CM | POA: Insufficient documentation

## 2013-06-09 DIAGNOSIS — G8929 Other chronic pain: Secondary | ICD-10-CM | POA: Insufficient documentation

## 2013-06-09 DIAGNOSIS — Z79899 Other long term (current) drug therapy: Secondary | ICD-10-CM | POA: Insufficient documentation

## 2013-06-09 DIAGNOSIS — M1990 Unspecified osteoarthritis, unspecified site: Secondary | ICD-10-CM

## 2013-06-09 DIAGNOSIS — M129 Arthropathy, unspecified: Secondary | ICD-10-CM

## 2013-06-09 DIAGNOSIS — F17200 Nicotine dependence, unspecified, uncomplicated: Secondary | ICD-10-CM | POA: Insufficient documentation

## 2013-06-09 DIAGNOSIS — I10 Essential (primary) hypertension: Secondary | ICD-10-CM | POA: Insufficient documentation

## 2013-06-09 DIAGNOSIS — E785 Hyperlipidemia, unspecified: Secondary | ICD-10-CM | POA: Insufficient documentation

## 2013-06-09 MED ORDER — OXYCODONE-ACETAMINOPHEN 5-325 MG PO TABS
2.0000 | ORAL_TABLET | Freq: Once | ORAL | Status: AC
  Administered 2013-06-09: 2 via ORAL
  Filled 2013-06-09: qty 2

## 2013-06-09 MED ORDER — ALBUTEROL SULFATE HFA 108 (90 BASE) MCG/ACT IN AERS: 2.0000 | INHALATION_SPRAY | Freq: Four times a day (QID) | RESPIRATORY_TRACT | Status: DC | PRN

## 2013-06-09 MED ORDER — ROSUVASTATIN CALCIUM 10 MG PO TABS: 10.0000 mg | ORAL_TABLET | Freq: Every day | ORAL | Status: DC

## 2013-06-09 MED ORDER — OXYCODONE-ACETAMINOPHEN 5-325 MG PO TABS: 1.0000 | ORAL_TABLET | Freq: Four times a day (QID) | ORAL | Status: DC | PRN

## 2013-06-09 MED ORDER — OXYCODONE HCL 5 MG PO TABS: 5.0000 mg | ORAL_TABLET | ORAL | Status: DC | PRN

## 2013-06-09 MED ORDER — FLUTICASONE-SALMETEROL 250-50 MCG/DOSE IN AEPB: 1.0000 | INHALATION_SPRAY | Freq: Two times a day (BID) | RESPIRATORY_TRACT | Status: DC

## 2013-06-09 MED ORDER — HYDRALAZINE HCL 25 MG PO TABS: 25.0000 mg | ORAL_TABLET | Freq: Three times a day (TID) | ORAL | Status: DC

## 2013-06-09 MED ORDER — CARVEDILOL 25 MG PO TABS: 25.0000 mg | ORAL_TABLET | Freq: Two times a day (BID) | ORAL | Status: DC

## 2013-06-09 MED ORDER — AMLODIPINE BESYLATE 10 MG PO TABS: 10.0000 mg | ORAL_TABLET | Freq: Every day | ORAL | Status: DC

## 2013-06-09 NOTE — Telephone Encounter (Signed)
Pt was here for OV with Dr Delton See today and asked for a refill on Oxycodone. Dr Delton See agreed to give him a RX for a 7 day supply (28 tablets only no refills). The pt had to leave before Dr Delton See could sign RX because he had an appt with [facility]. The pt's daughter, [name], stated that she would see if she could get a prescription from [facility] for his Oxycodone and if not she will come back to this office to pick up the prescription that Dr Delton See gave him. I have left that RX with Verneda Skill in the Patient advocate room if the pt calls for it.

## 2013-06-09 NOTE — Patient Instructions (Signed)
Your physician has recommended you make the following change in your medication: start using Advair 250/50 (1 puff twice daily) and Albuterol  (inhale 2 puffs as needed)  Your physucian is referring you to an Orthopaedic surgeon for arthritis.  Your physician recommends that you schedule a follow-up appointment in: Please call in a month to schedule follow up

## 2013-06-09 NOTE — ED Notes (Signed)
Bed: YN82 Expected date:  Expected time:  Means of arrival:  Comments: EMS-knee swollen

## 2013-06-09 NOTE — ED Notes (Signed)
[facility] called for transport.  

## 2013-06-09 NOTE — Progress Notes (Signed)
Patient ID: Martin Cain, male   DOB: 1946-04-26, 67 y.o.   MRN: [id number]    HPI; 67 year old male who was visiting from [place] and was admitted with NSTEMI, that was believed to be secondary to malignant hypertension and acute on chronic renal failure. He was discharged on BP meds that he didn't pick up and was not using.  He was supposed to go back to [place] and have a stress test done there, however he is still in [place] and coming today for medications refill. He denies chest pain but complains of pain and swelling in his right knee.  Filed Vitals:   06/09/13 1209  BP: 174/75  Pulse: 91    Intake/Output Summary (Last 24 hours) at 05/31/13 4098 Last data filed at 05/30/13 2300  Gross per 24 hour  Intake    720 ml  Output    550 ml  Net    170 ml    SUBJECTIVE Patient mentally much clearer today. Denies chest pain or SOB. Biggest complaint is of leg pain- this is improved with oxycontin.  LABS: Basic Metabolic Panel: No results found for this basename: NA, K, CL, CO2, GLUCOSE, BUN, CREATININE, CALCIUM, MG, PHOS,  in the last 72 hours  BNP: 6983  Radiology/Studies:  Dg Chest 2 View  05/27/2013   CLINICAL DATA:  Hypertension, peripheral swelling  EXAM: CHEST  2 VIEW  COMPARISON:  None  FINDINGS: Cardiac shadow is mildly enlarged. Postsurgical changes are seen. The lungs are well aerated bilaterally without focal infiltrate or sizable effusion. No acute bony abnormality is noted.  IMPRESSION: No acute abnormality noted.   Electronically Signed   By: Alcide Clever M.D.   On: 05/27/2013 10:10   MRI HEAD WITHOUT CONTRAST  TECHNIQUE:  Multiplanar, multiecho pulse sequences of the brain and surrounding  structures were obtained without intravenous contrast.  COMPARISON: None.  FINDINGS:  Exam is motion degraded.  No acute infarct.  No intracranial hemorrhage.  Remote small right basal ganglia/thalamic infarct. Remote tiny right  paracentral pontine infarct. Remote tiny  left cerebellar infarct.  Mild small vessel disease type changes.  Atrophy. Ventricular prominence felt to be related to atrophy rather  than hydrocephalus.  No intracranial mass lesion noted on this unenhanced exam.  Small right vertebral artery with superimposed atherosclerotic type  changes and significant narrowing. Left vertebral artery, basilar  artery and the internal carotid arteries as well as major dural  sinuses are patent.  Minimal spinal stenosis C3-4. Cervical medullary junction, pituitary  region, pineal region and orbital structures unremarkable.  Minimal paranasal sinus mucosal thickening.  IMPRESSION:  Exam is motion degraded.  No acute infarct.  Remote small right basal ganglia/thalamic infarct. Remote tiny right  paracentral pontine infarct. Remote tiny left cerebellar infarct.  Mild small vessel disease type changes.  Atrophy.  Small right vertebral artery with superimposed atherosclerotic type  changes and significant narrowing.  Electronically Signed  By: Bridgett Larsson M.D.  On: 05/30/2013 17:15   Ecg: NSR, LAD, LVH with repolarization abnormality.  PHYSICAL EXAM General: Thin, frail, alert Head: Normal. Neck: Negative for carotid bruits. JVD not elevated. Lungs: Clear bilaterally to auscultation without wheezes, rales, or rhonchi. Breathing is unlabored. Heart: RRR S1 S2 without murmurs, rubs, or gallops.  Abdomen: Soft, non-tender, non-distended with normoactive bowel sounds. No hepatomegaly. Extremities: No clubbing, cyanosis or edema. Swelling and tenderness of he right knee.  Distal pedal pulses are 2+ and equal bilaterally. Neuro: alert oriented x 3.   ASSESSMENT AND  PLAN:  1. Elevated troponin with flat curve. This is most likely related to malignant hypertension and acute on chronic renal failure. No active chest pain. The patient states that he will be moving back to [facility] in the next few days, he was advised to follow with his cardiologist  there for a stress test. Otherwise he will call us and schedule a Lexiscan nuclear stress test.   2. Malignant Hypertension. BP uncontrolled today. He was advised to continue hydralazine 25 mg po TID and was prescribed Carvedilol , amlodipine 10 mg PO daily  3. Acute on chronic renal failure. Creatinine is slightly higher. Urine output is good. ACEi and diuretics on hold.  4. CAD on ASA and statin.  5. Gout right knee. On colchicine, complaining of significant pain, prescription for 7 days of Oxycodone was given.  6. Hyperlipidemia - restart Crestor   Rico Sheehan MD,FACC 06/09/2013 12:20 PM

## 2013-06-09 NOTE — ED Notes (Signed)
Per [facility] pt was taken to his cardiologist at noon, pt lives on the 3rd floor and unable to walk to swollen rt knee, Dr. Delton See sent pt to ortho PA Stana Bunting and then sent here for admission. Pt has gout to rt knee

## 2013-06-09 NOTE — ED Provider Notes (Signed)
CSN: [id number]     Arrival date & time 06/09/13  1617 History   First MD Initiated Contact with Patient 06/09/13 1638     Chief Complaint  Patient presents with  . Knee Pain   (Consider location/radiation/quality/duration/timing/severity/associated sxs/prior Treatment) HPI Comments: Patient presents today with a chief complaint of right knee pain.  He reports that he has had constant pain of his right knee for the past 3 years.  He ran out of his Oxycodone today and the pain has been worse.  He states that he was seen by his Cardiologist today for his CHF.  Cardiologist felt that the CHF was stable.  However, he complained of increased right knee pain during that visit and was then sent to the Orthopedic clinic where he was evaluated by a PA.  An xray was done at that time, which the patient reports showed "bone on bone" He reports that he was told to come to the ED to be admitted due to his inability to ambulate.  Daughter reports that the patient has been unable to ambulate for the past two weeks secondary to pain.  Patient reports that he has had swelling of the right knee for the past couple of months.  He has not noticed any erythema or warmth of the knee.  He denies fever or chills.  Denies nausea or vomiting.  Denies numbness or tingling.    The history is provided by the patient.    Past Medical History  Diagnosis Date  . Hypertension   . Hyperlipidemia   . Renal insufficiency   . CAD (coronary artery disease)   . Gout    Past Surgical History  Procedure Laterality Date  . Cardiac surgery      2000; [place]  . Appendectomy    . Abdominal exploration surgery    . Tonsillectomy     Family History  Problem Relation Age of Onset  . Heart disease      No family history   History  Substance Use Topics  . Smoking status: Current Every Day Smoker -- 0.50 packs/day for 40 years    Types: Cigarettes  . Smokeless tobacco: Never Used  . Alcohol Use: Yes     Comment: 1 pint per  day     Review of Systems  Musculoskeletal:       Right knee pain  All other systems reviewed and are negative.    Allergies  Review of patient's allergies indicates no known allergies.  Home Medications   Current Outpatient Rx  Name  Route  Sig  Dispense  Refill  . albuterol (PROVENTIL HFA;VENTOLIN HFA) 108 (90 BASE) MCG/ACT inhaler   Inhalation   Inhale 2 puffs into the lungs every 6 (six) hours as needed for wheezing or shortness of breath.   1 Inhaler   3   . amLODipine (NORVASC) 10 MG tablet   Oral   Take 1 tablet (10 mg total) by mouth daily.   30 tablet   3   . carvedilol (COREG) 25 MG tablet   Oral   Take 1 tablet (25 mg total) by mouth 2 (two) times daily with a meal.   60 tablet   3   . colchicine 0.6 MG tablet   Oral   Take 0.5 tablets (0.3 mg total) by mouth daily.   30 tablet   0   . Fluticasone-Salmeterol (ADVAIR DISKUS) 250-50 MCG/DOSE AEPB   Inhalation   Inhale 1 puff into the lungs 2 (two)  times daily.   60 each   3   . hydrALAZINE (APRESOLINE) 25 MG tablet   Oral   Take 1 tablet (25 mg total) by mouth every 8 (eight) hours.   30 tablet   3   . nitroGLYCERIN (NITROSTAT) 0.4 MG SL tablet   Sublingual   Place 0.4 mg under the tongue every 5 (five) minutes as needed for chest pain.         Marland Kitchen omeprazole (PRILOSEC) 40 MG capsule   Oral   Take 1 capsule (40 mg total) by mouth daily.   30 capsule   0   . oxyCODONE (OXY IR/ROXICODONE) 5 MG immediate release tablet   Oral   Take 1 tablet (5 mg total) by mouth every 4 (four) hours as needed for moderate pain.   28 tablet   0   . predniSONE (DELTASONE) 20 MG tablet   Oral   Take 1 tablet (20 mg total) by mouth daily with breakfast.   3 tablet   0     For days, then stop   . risperiDONE (RISPERDAL M-TABS) 1 MG disintegrating tablet   Oral   Take 1 tablet (1 mg total) by mouth every evening.   30 tablet   0   . rosuvastatin (CRESTOR) 10 MG tablet   Oral   Take 1 tablet  (10 mg total) by mouth daily.   30 tablet   3    BP 170/76  Pulse 100  Temp(Src) 98.5 F (36.9 C) (Oral)  Resp 20  SpO2 100% Physical Exam  Nursing note and vitals reviewed. Constitutional: He appears well-developed and well-nourished.  HENT:  Head: Normocephalic and atraumatic.  Neck: Normal range of motion. Neck supple.  Cardiovascular: Normal rate, regular rhythm and normal heart sounds.   Pulmonary/Chest: Effort normal and breath sounds normal.  Musculoskeletal:       Right knee: He exhibits swelling. He exhibits no erythema. Tenderness found.  Diffuse swelling of the right knee.  Patient able to move the right knee, but reports increased pain with movement.  No erythema or warmth of the right knee.  3+ pitting edema of the feet and ankles bilaterally  Neurological: He is alert.  Distal sensation of right foot is intact  Skin: Skin is warm and dry.  Psychiatric: He has a normal mood and affect.    ED Course  Procedures (including critical care time) Labs Review Labs Reviewed - No data to display Imaging Review No results found.  EKG Interpretation   None      5:23 PM Discussed with Zonia Kief the PA with [facility].  He states that the reason that the patient was sent to the ED was because of the inability to ambulate.  He reports that xrays were done in the office, which showed medial compartment "bone on bone." MDM  No diagnosis found. Patient with a history of known OA of the right knee presents today with chronic right knee pain.  He also reports that he has had swelling of the knee for the past couple of weeks.  On exam, the patient is afebrile.  No erythema or warmth of the right knee.  Patient able to move his knee.  No evidence of septic joint.  Patient had an xray done by Orthopedics earlier today, which showed arthritis.  Patient given two Percocet in the ED and pain improved significantly.  Feel that the patient is stable for discharge.   Patient given prescription for Percocet.  Return precautions given.    Santiago Glad, PA-C 06/13/13 0630

## 2013-06-13 ENCOUNTER — Encounter: Payer: Medicare HMO | Admitting: Interventional Cardiology

## 2013-06-13 NOTE — ED Provider Notes (Signed)
Medical screening examination/treatment/procedure(s) were conducted as a shared visit with non-physician practitioner(s) and myself.  I personally evaluated the patient during the encounter.  EKG Interpretation   None       patient has had progressive trouble walking due to his chronic knee pain. Has swollen knee but is chronic, no concerning signs/symptoms to suggest needing diagnostic arthrocentesis. No dyspnea or othopnea to suggest worsening CHF. Recommend f/u with his ortho for possible steroid injections per their prior conversations.  Audree Camel, MD 06/13/13 [phone]

## 2013-06-19 ENCOUNTER — Emergency Department (HOSPITAL_COMMUNITY): Payer: Medicare HMO

## 2013-06-19 ENCOUNTER — Encounter (HOSPITAL_COMMUNITY): Payer: Self-pay | Admitting: Emergency Medicine

## 2013-06-19 ENCOUNTER — Inpatient Hospital Stay (HOSPITAL_COMMUNITY)
Admission: EM | Admit: 2013-06-19 | Discharge: 2013-06-22 | DRG: 292 | Disposition: A | Discharge status: Home Health | Payer: Medicare HMO | Attending: Internal Medicine | Admitting: Internal Medicine

## 2013-06-19 DIAGNOSIS — R748 Abnormal levels of other serum enzymes: Secondary | ICD-10-CM | POA: Diagnosis present

## 2013-06-19 DIAGNOSIS — R634 Abnormal weight loss: Secondary | ICD-10-CM | POA: Diagnosis present

## 2013-06-19 DIAGNOSIS — M1710 Unilateral primary osteoarthritis, unspecified knee: Secondary | ICD-10-CM | POA: Diagnosis present

## 2013-06-19 DIAGNOSIS — I2510 Atherosclerotic heart disease of native coronary artery without angina pectoris: Secondary | ICD-10-CM | POA: Diagnosis present

## 2013-06-19 DIAGNOSIS — E785 Hyperlipidemia, unspecified: Secondary | ICD-10-CM | POA: Diagnosis present

## 2013-06-19 DIAGNOSIS — N289 Disorder of kidney and ureter, unspecified: Secondary | ICD-10-CM | POA: Diagnosis present

## 2013-06-19 DIAGNOSIS — F17200 Nicotine dependence, unspecified, uncomplicated: Secondary | ICD-10-CM | POA: Diagnosis present

## 2013-06-19 DIAGNOSIS — N179 Acute kidney failure, unspecified: Secondary | ICD-10-CM

## 2013-06-19 DIAGNOSIS — Z91199 Patient's noncompliance with other medical treatment and regimen due to unspecified reason: Secondary | ICD-10-CM

## 2013-06-19 DIAGNOSIS — I214 Non-ST elevation (NSTEMI) myocardial infarction: Secondary | ICD-10-CM | POA: Diagnosis present

## 2013-06-19 DIAGNOSIS — Z7982 Long term (current) use of aspirin: Secondary | ICD-10-CM

## 2013-06-19 DIAGNOSIS — M25569 Pain in unspecified knee: Secondary | ICD-10-CM | POA: Diagnosis present

## 2013-06-19 DIAGNOSIS — D649 Anemia, unspecified: Secondary | ICD-10-CM

## 2013-06-19 DIAGNOSIS — M109 Gout, unspecified: Secondary | ICD-10-CM | POA: Diagnosis present

## 2013-06-19 DIAGNOSIS — I10 Essential (primary) hypertension: Secondary | ICD-10-CM | POA: Diagnosis present

## 2013-06-19 DIAGNOSIS — M7989 Other specified soft tissue disorders: Secondary | ICD-10-CM | POA: Diagnosis present

## 2013-06-19 DIAGNOSIS — Z681 Body mass index (BMI) 19 or less, adult: Secondary | ICD-10-CM

## 2013-06-19 DIAGNOSIS — Z951 Presence of aortocoronary bypass graft: Secondary | ICD-10-CM

## 2013-06-19 DIAGNOSIS — Z9119 Patient's noncompliance with other medical treatment and regimen: Secondary | ICD-10-CM

## 2013-06-19 DIAGNOSIS — I129 Hypertensive chronic kidney disease with stage 1 through stage 4 chronic kidney disease, or unspecified chronic kidney disease: Secondary | ICD-10-CM | POA: Diagnosis present

## 2013-06-19 DIAGNOSIS — G8929 Other chronic pain: Secondary | ICD-10-CM

## 2013-06-19 DIAGNOSIS — I509 Heart failure, unspecified: Secondary | ICD-10-CM | POA: Diagnosis present

## 2013-06-19 DIAGNOSIS — M25469 Effusion, unspecified knee: Secondary | ICD-10-CM | POA: Diagnosis present

## 2013-06-19 DIAGNOSIS — R809 Proteinuria, unspecified: Secondary | ICD-10-CM | POA: Diagnosis present

## 2013-06-19 DIAGNOSIS — I5043 Acute on chronic combined systolic (congestive) and diastolic (congestive) heart failure: Principal | ICD-10-CM | POA: Diagnosis present

## 2013-06-19 DIAGNOSIS — N183 Chronic kidney disease, stage 3 unspecified: Secondary | ICD-10-CM | POA: Diagnosis present

## 2013-06-19 DIAGNOSIS — E440 Moderate protein-calorie malnutrition: Secondary | ICD-10-CM | POA: Diagnosis present

## 2013-06-19 DIAGNOSIS — D631 Anemia in chronic kidney disease: Secondary | ICD-10-CM | POA: Diagnosis present

## 2013-06-19 HISTORY — DX: Chronic kidney disease, unspecified: N18.9

## 2013-06-19 HISTORY — DX: Unspecified osteoarthritis, unspecified site: M19.90

## 2013-06-19 HISTORY — DX: Anemia, unspecified: D64.9

## 2013-06-19 HISTORY — DX: Heart failure, unspecified: I50.9

## 2013-06-19 LAB — BASIC METABOLIC PANEL
BUN: 25 mg/dL — ABNORMAL HIGH (ref 6–23)
Chloride: 107 mEq/L (ref 96–112)
Creatinine, Ser: 1.9 mg/dL — ABNORMAL HIGH (ref 0.50–1.35)
GFR calc non Af Amer: 34 mL/min — ABNORMAL LOW (ref >90)
Glucose, Bld: 75 mg/dL (ref 70–99)
Potassium: 3.8 mEq/L (ref 3.5–5.1)
Sodium: 144 mEq/L (ref 135–145)

## 2013-06-19 LAB — URINALYSIS, ROUTINE W REFLEX MICROSCOPIC
Bilirubin Urine: NEGATIVE
Glucose, UA: NEGATIVE mg/dL
Hgb urine dipstick: NEGATIVE
Ketones, ur: NEGATIVE mg/dL
Specific Gravity, Urine: 1.013 (ref 1.005–1.030)
Urobilinogen, UA: 0.2 mg/dL (ref 0.0–1.0)
pH: 6.5 (ref 5.0–8.0)

## 2013-06-19 LAB — CBC WITH DIFFERENTIAL/PLATELET
Basophils Absolute: 0 10*3/uL (ref 0.0–0.1)
Eosinophils Absolute: 0.1 10*3/uL (ref 0.0–0.7)
Eosinophils Relative: 2 % (ref 0–5)
HCT: 31 % — ABNORMAL LOW (ref 39.0–52.0)
Lymphocytes Relative: 51 % — ABNORMAL HIGH (ref 12–46)
Lymphs Abs: 2.6 10*3/uL (ref 0.7–4.0)
MCV: 88.8 fL (ref 78.0–100.0)
Neutro Abs: 1.9 10*3/uL (ref 1.7–7.7)
Neutrophils Relative %: 38 % — ABNORMAL LOW (ref 43–77)
Platelets: 192 10*3/uL (ref 150–400)
RBC: 3.49 MIL/uL — ABNORMAL LOW (ref 4.22–5.81)
WBC: 5.1 10*3/uL (ref 4.0–10.5)

## 2013-06-19 LAB — POCT I-STAT TROPONIN I

## 2013-06-19 LAB — URINE MICROSCOPIC-ADD ON

## 2013-06-19 MED ORDER — MORPHINE SULFATE 4 MG/ML IJ SOLN
4.0000 mg | Freq: Once | INTRAMUSCULAR | Status: AC
  Administered 2013-06-19: 4 mg via INTRAVENOUS
  Filled 2013-06-19: qty 1

## 2013-06-19 MED ORDER — FUROSEMIDE 10 MG/ML IJ SOLN
80.0000 mg | Freq: Once | INTRAMUSCULAR | Status: AC
  Administered 2013-06-20: 80 mg via INTRAVENOUS
  Filled 2013-06-19: qty 8

## 2013-06-19 NOTE — ED Notes (Signed)
Pt brought to ED by daughter c/o bilat pedal edema with pain in R foot and knee, per pt this is associated with gout flare. Pt states edema has decreased since being d/c last week from [facility].

## 2013-06-19 NOTE — ED Provider Notes (Signed)
CSN: [id number]     Arrival date & time 06/19/13  1934 History   First MD Initiated Contact with Patient 06/19/13 2004     Chief Complaint  Patient presents with  . Leg Swelling   (Consider location/radiation/quality/duration/timing/severity/associated sxs/prior Treatment) HPI This is a 67 year old male with a past medical history of congestive heart failure, hypertension and, bilateral osteoarthritis of the knees, who is her current daily smoker.  Patient presents to the emergency department chief complaint of swelling in the feet and bilateral knee pain.  The patient was admitted to the hospital for acute CHF exacerbation, elevated troponin, acute on chronic renal failure.  The patient states that he is no longer taking any of his Lasix.  This was stopped by his primary care physician who is in [place] where the patient is from.  The patient also complains of severe knee pain.  His daughter is with him.  She states that her father has been more short of breath, weaker.  He has had extreme swelling in his legs that has not been resolving.  He is unable to walk up 3 flights of stairs to get in and out of her home. Unable to ambulate at the house as well and has had several falls. He has been having to be carried by his son. Denies fevers, chills, myalgias, arthralgias. Denies chest tightness or pressure, radiation to left arm, jaw or back, or diaphoresis. Denies dysuria, flank pain, suprapubic pain, frequency, urgency, or hematuria. Denies headaches, light headedness, weakness, visual disturbances. Denies abdominal pain, nausea, vomiting, diarrhea or constipation.   Past Medical History  Diagnosis Date  . Hypertension   . Hyperlipidemia   . Renal insufficiency   . CAD (coronary artery disease)   . Gout    Past Surgical History  Procedure Laterality Date  . Cardiac surgery      2000; [place]  . Appendectomy    . Abdominal exploration surgery    . Tonsillectomy     Family History   Problem Relation Age of Onset  . Heart disease      No family history   History  Substance Use Topics  . Smoking status: Current Every Day Smoker -- 0.50 packs/day for 40 years    Types: Cigarettes  . Smokeless tobacco: Never Used  . Alcohol Use: Yes     Comment: 1 pint per day     Review of Systems Ten systems reviewed and are negative for acute change, except as noted in the HPI.    Allergies  Review of patient's allergies indicates no known allergies.  Home Medications   Current Outpatient Rx  Name  Route  Sig  Dispense  Refill  . albuterol (PROVENTIL HFA;VENTOLIN HFA) 108 (90 BASE) MCG/ACT inhaler   Inhalation   Inhale 2 puffs into the lungs every 6 (six) hours as needed for wheezing or shortness of breath.   1 Inhaler   3   . amLODipine (NORVASC) 10 MG tablet   Oral   Take 1 tablet (10 mg total) by mouth daily.   30 tablet   3   . aspirin 81 MG chewable tablet   Oral   Chew by mouth daily.         . carvedilol (COREG) 25 MG tablet   Oral   Take 1 tablet (25 mg total) by mouth 2 (two) times daily with a meal.   60 tablet   3   . Cholecalciferol (VITAMIN D PO)   Oral  Take 1,000 mg by mouth daily.         . colchicine 0.6 MG tablet   Oral   Take 0.5 tablets (0.3 mg total) by mouth daily.   30 tablet   0   . Cyanocobalamin (VITAMIN B-12 PO)   Oral   Take 1,000 mg by mouth daily.         . Fluticasone-Salmeterol (ADVAIR) 250-50 MCG/DOSE AEPB   Inhalation   Inhale 1 puff into the lungs 2 (two) times daily.         . hydrALAZINE (APRESOLINE) 25 MG tablet   Oral   Take 1 tablet (25 mg total) by mouth every 8 (eight) hours.   30 tablet   3   . nitroGLYCERIN (NITROSTAT) 0.4 MG SL tablet   Sublingual   Place 0.4 mg under the tongue every 5 (five) minutes as needed for chest pain.         Marland Kitchen omeprazole (PRILOSEC) 40 MG capsule   Oral   Take 1 capsule (40 mg total) by mouth daily.   30 capsule   0   . oxyCODONE (OXY IR/ROXICODONE) 5  MG immediate release tablet   Oral   Take 1 tablet (5 mg total) by mouth every 4 (four) hours as needed for moderate pain.   28 tablet   0   . oxyCODONE-acetaminophen (PERCOCET/ROXICET) 5-325 MG per tablet   Oral   Take 1-2 tablets by mouth every 6 (six) hours as needed for severe pain.   20 tablet   0   . risperiDONE (RISPERDAL M-TABS) 1 MG disintegrating tablet   Oral   Take 1 tablet (1 mg total) by mouth every evening.   30 tablet   0   . rosuvastatin (CRESTOR) 10 MG tablet   Oral   Take 1 tablet (10 mg total) by mouth daily.   30 tablet   3   . predniSONE (DELTASONE) 20 MG tablet   Oral   Take 1 tablet (20 mg total) by mouth daily with breakfast.   3 tablet   0     For days, then stop    BP 183/78  Pulse 81  Temp(Src) 98.2 F (36.8 C)  Resp 20  Ht 5\' 9"  (1.753 m)  Wt 129 lb (58.514 kg)  BMI 19.04 kg/m2  SpO2 100% Physical Exam  Nursing note and vitals reviewed. Constitutional:  Thin chronically ill-appearing male  HENT:  Head: Normocephalic and atraumatic.  Eyes: Conjunctivae are normal.  Neck: JVD present.  Cardiovascular: Normal rate and regular rhythm.   3+ pitting edema bilaterally from the feet to the knees.  Swelling is slightly greater on the left.  Well-healed surgical scar from saphenous vein removal left ankle.  Pulmonary/Chest: Effort normal and breath sounds normal. He has no wheezes. He has no rales.  Abdominal: Soft. He exhibits no mass. There is no tenderness. There is no guarding.  Well-healed midline surgical scar    ED Course  Procedures (including critical care time) Labs Review Labs Reviewed  CBC WITH DIFFERENTIAL  URINALYSIS, ROUTINE W REFLEX MICROSCOPIC  BASIC METABOLIC PANEL  PRO B NATRIURETIC PEPTIDE   Imaging Review Dg Chest 2 View  06/19/2013   CLINICAL DATA:  Shortness of breath, bilateral leg swelling  EXAM: CHEST  2 VIEW  COMPARISON:  Prior radiograph from 05/27/2013.  FINDINGS: Median sternotomy wires with  underlying surgical clips are present. There is mild to moderate cardiomegaly. Tortuosity of the intrathoracic aorta is noted.  Lungs  are well inflated. There is mild diffuse prominence of the interstitial markings suggestive of mild interstitial edema. No Atsushi pulmonary edema. No focal infiltrates identified. Linear opacity within the left lung base is most compatible with atelectasis. No pneumothorax or pleural effusion.  Osseous structures are within normal limits.  IMPRESSION: Moderate cardiomegaly with mild diffuse prominence of the interstitial markings, suggestive of mild pulmonary interstitial edema.   Electronically Signed   By: Rise Mu M.D.   On: 06/19/2013 22:09    EKG Interpretation   None       MDM  No diagnosis found. Patient EKG unchanged from previous.  He has a left ventricular hypertrophy.  Patient does have an elevated troponin however feel this is secondary to his renal insufficiency.  We'll do a repeat.  He has had chest pain at this time.  He does have some increased interstitial markings likely consistent with mild pulmonary edema.  I do feel that patient's symptoms represent a does agree to have his heart failure.  He is hypertensive.  It is probably noncompliant with his medications  12:57 AM Patient with a 2 g drop in his hgb. Denies dark and tarry His creatinine is the lowest it has been in the past 3 weeks. I discussed this with my attending physician who agrees that we may begin 80 mg IV lasix. Patient unable to ambulate due to hi knee pain and foot swelling. I have ordered  Repeat troponin. I believe he will need admission for diuresis,   Patient will be admitted by Dr. Toniann Fail.   Arthor Captain, PA-C 06/20/13 1056

## 2013-06-19 NOTE — ED Provider Notes (Signed)
Patient reports he had open-heart surgery done in 2010. 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 He reports he has started having swelling of his feet over the past 3 weeks. He states he hasn't had it before. He has some shortness of breath but denies chest pain.  Patient is alert and cooperative. He is very thin however he's noted have 2+ swelling of his feet and his ankles bilaterally.  Medical screening examination/treatment/procedure(s) were conducted as a shared visit with non-physician practitioner(s) and myself.  I personally evaluated the patient during the encounter.  EKG Interpretation    Date/Time:  Sunday June 19 2013 22:11:43 EST Ventricular Rate:  85 PR Interval:  149 QRS Duration: 98 QT Interval:  417 QTC Calculation: 496 R Axis:   -87 Text Interpretation:  Sinus rhythm Left atrial enlargement Left anterior fascicular block LVH with secondary repolarization abnormality Anterior Q waves, possibly due to LVH No significant change since last tracing Confirmed by Sham Alviar  MD-I, Lovetta Condie ([id number]) on 06/19/2013 10:29:34 PM             Devoria Albe, MD, Franz Dell, MD 06/19/13 2325

## 2013-06-19 NOTE — ED Notes (Signed)
Critical I stat Troponin -  PA Abigail aware.

## 2013-06-20 ENCOUNTER — Encounter (HOSPITAL_COMMUNITY): Payer: Self-pay

## 2013-06-20 ENCOUNTER — Inpatient Hospital Stay (HOSPITAL_COMMUNITY): Payer: Medicare HMO

## 2013-06-20 DIAGNOSIS — I10 Essential (primary) hypertension: Secondary | ICD-10-CM

## 2013-06-20 DIAGNOSIS — M7989 Other specified soft tissue disorders: Secondary | ICD-10-CM

## 2013-06-20 DIAGNOSIS — I214 Non-ST elevation (NSTEMI) myocardial infarction: Secondary | ICD-10-CM

## 2013-06-20 DIAGNOSIS — D649 Anemia, unspecified: Secondary | ICD-10-CM

## 2013-06-20 DIAGNOSIS — I5043 Acute on chronic combined systolic (congestive) and diastolic (congestive) heart failure: Principal | ICD-10-CM

## 2013-06-20 DIAGNOSIS — R7989 Other specified abnormal findings of blood chemistry: Secondary | ICD-10-CM

## 2013-06-20 DIAGNOSIS — I509 Heart failure, unspecified: Secondary | ICD-10-CM

## 2013-06-20 DIAGNOSIS — M109 Gout, unspecified: Secondary | ICD-10-CM

## 2013-06-20 LAB — COMPREHENSIVE METABOLIC PANEL
Albumin: 2.8 g/dL — ABNORMAL LOW (ref 3.5–5.2)
Alkaline Phosphatase: 50 U/L (ref 39–117)
BUN: 22 mg/dL (ref 6–23)
CO2: 30 mEq/L (ref 19–32)
Calcium: 8.7 mg/dL (ref 8.4–10.5)
Chloride: 103 mEq/L (ref 96–112)
Creatinine, Ser: 1.88 mg/dL — ABNORMAL HIGH (ref 0.50–1.35)
GFR calc Af Amer: 40 mL/min — ABNORMAL LOW (ref >90)
GFR calc non Af Amer: 35 mL/min — ABNORMAL LOW (ref >90)
Glucose, Bld: 87 mg/dL (ref 70–99)
Total Bilirubin: 0.3 mg/dL (ref 0.3–1.2)

## 2013-06-20 LAB — CBC WITH DIFFERENTIAL/PLATELET
Basophils Absolute: 0 10*3/uL (ref 0.0–0.1)
Eosinophils Absolute: 0.1 10*3/uL (ref 0.0–0.7)
Hemoglobin: 9.6 g/dL — ABNORMAL LOW (ref 13.0–17.0)
Lymphs Abs: 2.4 10*3/uL (ref 0.7–4.0)
MCHC: 32 g/dL (ref 30.0–36.0)
Monocytes Relative: 9 % (ref 3–12)
Neutro Abs: 1.5 10*3/uL — ABNORMAL LOW (ref 1.7–7.7)
Neutrophils Relative %: 35 % — ABNORMAL LOW (ref 43–77)
Platelets: 200 10*3/uL (ref 150–400)
RBC: 3.38 MIL/uL — ABNORMAL LOW (ref 4.22–5.81)
RDW: 14.3 % (ref 11.5–15.5)
WBC: 4.4 10*3/uL (ref 4.0–10.5)

## 2013-06-20 LAB — TROPONIN I
Troponin I: 0.79 ng/mL (ref <0.30)
Troponin I: 0.92 ng/mL (ref <0.30)
Troponin I: 1.02 ng/mL (ref <0.30)
Troponin I: 1.02 ng/mL (ref <0.30)

## 2013-06-20 LAB — URIC ACID: Uric Acid, Serum: 9.4 mg/dL — ABNORMAL HIGH (ref 4.0–7.8)

## 2013-06-20 MED ORDER — MOMETASONE FURO-FORMOTEROL FUM 100-5 MCG/ACT IN AERO
2.0000 | INHALATION_SPRAY | Freq: Two times a day (BID) | RESPIRATORY_TRACT | Status: DC
  Administered 2013-06-20 – 2013-06-21 (×3): 2 via RESPIRATORY_TRACT
  Filled 2013-06-20: qty 8.8

## 2013-06-20 MED ORDER — RISPERIDONE 1 MG PO TBDP
1.0000 mg | ORAL_TABLET | Freq: Every evening | ORAL | Status: DC
  Administered 2013-06-20 – 2013-06-21 (×2): 1 mg via ORAL
  Filled 2013-06-20 (×3): qty 1

## 2013-06-20 MED ORDER — ALBUTEROL SULFATE HFA 108 (90 BASE) MCG/ACT IN AERS: 2.0000 | INHALATION_SPRAY | Freq: Four times a day (QID) | RESPIRATORY_TRACT | Status: DC | PRN

## 2013-06-20 MED ORDER — ONDANSETRON HCL 4 MG/2ML IJ SOLN: 4.0000 mg | Freq: Four times a day (QID) | INTRAMUSCULAR | Status: DC | PRN

## 2013-06-20 MED ORDER — ACETAMINOPHEN 650 MG RE SUPP: 650.0000 mg | Freq: Four times a day (QID) | RECTAL | Status: DC | PRN

## 2013-06-20 MED ORDER — ATORVASTATIN CALCIUM 20 MG PO TABS
20.0000 mg | ORAL_TABLET | Freq: Every day | ORAL | Status: DC
  Administered 2013-06-20 – 2013-06-21 (×2): 20 mg via ORAL
  Filled 2013-06-20 (×3): qty 1

## 2013-06-20 MED ORDER — PANTOPRAZOLE SODIUM 40 MG PO TBEC
40.0000 mg | DELAYED_RELEASE_TABLET | Freq: Every day | ORAL | Status: DC
  Administered 2013-06-20 – 2013-06-22 (×3): 40 mg via ORAL
  Filled 2013-06-20 (×2): qty 1

## 2013-06-20 MED ORDER — ENSURE COMPLETE PO LIQD
237.0000 mL | Freq: Two times a day (BID) | ORAL | Status: DC
  Administered 2013-06-21 – 2013-06-22 (×3): 237 mL via ORAL

## 2013-06-20 MED ORDER — ASPIRIN EC 325 MG PO TBEC
325.0000 mg | DELAYED_RELEASE_TABLET | Freq: Every day | ORAL | Status: DC
  Administered 2013-06-20 – 2013-06-22 (×3): 325 mg via ORAL
  Filled 2013-06-20 (×3): qty 1

## 2013-06-20 MED ORDER — NITROGLYCERIN 0.4 MG/HR TD PT24
0.4000 mg | MEDICATED_PATCH | Freq: Every day | TRANSDERMAL | Status: DC
  Administered 2013-06-20 – 2013-06-22 (×3): 0.4 mg via TRANSDERMAL
  Filled 2013-06-20 (×3): qty 1

## 2013-06-20 MED ORDER — HYDRALAZINE HCL 25 MG PO TABS
25.0000 mg | ORAL_TABLET | Freq: Three times a day (TID) | ORAL | Status: DC
  Administered 2013-06-20 – 2013-06-21 (×4): 25 mg via ORAL
  Filled 2013-06-20 (×7): qty 1

## 2013-06-20 MED ORDER — OXYCODONE-ACETAMINOPHEN 5-325 MG PO TABS: 1.0000 | ORAL_TABLET | Freq: Four times a day (QID) | ORAL | Status: DC | PRN

## 2013-06-20 MED ORDER — ACETAMINOPHEN 325 MG PO TABS: 650.0000 mg | ORAL_TABLET | Freq: Four times a day (QID) | ORAL | Status: DC | PRN

## 2013-06-20 MED ORDER — HEPARIN SODIUM (PORCINE) 5000 UNIT/ML IJ SOLN
5000.0000 [IU] | Freq: Three times a day (TID) | INTRAMUSCULAR | Status: DC
  Administered 2013-06-20 – 2013-06-22 (×8): 5000 [IU] via SUBCUTANEOUS
  Filled 2013-06-20 (×10): qty 1

## 2013-06-20 MED ORDER — SODIUM CHLORIDE 0.9 % IJ SOLN
3.0000 mL | Freq: Two times a day (BID) | INTRAMUSCULAR | Status: DC
  Administered 2013-06-21: 3 mL via INTRAVENOUS

## 2013-06-20 MED ORDER — AMLODIPINE BESYLATE 10 MG PO TABS
10.0000 mg | ORAL_TABLET | Freq: Every day | ORAL | Status: DC
  Administered 2013-06-20 – 2013-06-22 (×3): 10 mg via ORAL
  Filled 2013-06-20 (×3): qty 1

## 2013-06-20 MED ORDER — FUROSEMIDE 10 MG/ML IJ SOLN
40.0000 mg | Freq: Every day | INTRAMUSCULAR | Status: DC
  Administered 2013-06-20 – 2013-06-21 (×2): 40 mg via INTRAVENOUS
  Filled 2013-06-20 (×3): qty 4

## 2013-06-20 MED ORDER — CARVEDILOL 25 MG PO TABS
25.0000 mg | ORAL_TABLET | Freq: Two times a day (BID) | ORAL | Status: DC
  Administered 2013-06-20 – 2013-06-22 (×5): 25 mg via ORAL
  Filled 2013-06-20 (×7): qty 1

## 2013-06-20 MED ORDER — COLCHICINE 0.6 MG PO TABS
0.3000 mg | ORAL_TABLET | Freq: Every day | ORAL | Status: DC
  Administered 2013-06-20 – 2013-06-22 (×3): 0.3 mg via ORAL
  Filled 2013-06-20 (×3): qty 0.5

## 2013-06-20 MED ORDER — SODIUM CHLORIDE 0.9 % IJ SOLN
3.0000 mL | Freq: Two times a day (BID) | INTRAMUSCULAR | Status: DC
  Administered 2013-06-20 – 2013-06-22 (×3): 3 mL via INTRAVENOUS

## 2013-06-20 MED ORDER — METHYLPREDNISOLONE SODIUM SUCC 40 MG IJ SOLR
20.0000 mg | Freq: Every day | INTRAMUSCULAR | Status: DC
  Administered 2013-06-20 – 2013-06-21 (×2): 20 mg via INTRAVENOUS
  Filled 2013-06-20 (×2): qty 0.5

## 2013-06-20 MED ORDER — ONDANSETRON HCL 4 MG PO TABS: 4.0000 mg | ORAL_TABLET | Freq: Four times a day (QID) | ORAL | Status: DC | PRN

## 2013-06-20 MED ORDER — PANTOPRAZOLE SODIUM 40 MG PO TBEC
40.0000 mg | DELAYED_RELEASE_TABLET | Freq: Every day | ORAL | Status: DC
  Filled 2013-06-20 (×2): qty 1

## 2013-06-20 MED ORDER — OXYCODONE HCL 5 MG PO TABS: 5.0000 mg | ORAL_TABLET | ORAL | Status: DC | PRN

## 2013-06-20 MED ORDER — NITROGLYCERIN 0.4 MG SL SUBL: 0.4000 mg | SUBLINGUAL_TABLET | SUBLINGUAL | Status: DC | PRN

## 2013-06-20 NOTE — Progress Notes (Signed)
[facility]  Patient Status: Active (receiving services up to time of hospitalization)  [facility] is providing the following services: RN, PT, OT and HHA  If patient discharges after hours, please call [phone].   Sherryll Burger 06/20/2013, 3:21 PM

## 2013-06-20 NOTE — Progress Notes (Signed)
INITIAL NUTRITION ASSESSMENT  Pt meets criteria for moderate malnutrition MALNUTRITION in the context of chronic illness as evidenced by pt with mild/moderate muscle wasting and subcutaneous fat loss in clavicles, upper arms, hands, and ribs.  DOCUMENTATION CODES Per approved criteria  -Non-severe (moderate) malnutrition in the context of chronic illness   INTERVENTION: - Ensure Complete BID - Educated pt on low sodium diet therapy for CHF and provided handouts of this information - Will continue to monitor   NUTRITION DIAGNOSIS: Increased nutrient needs related to moderate malnutrition as evidenced by nutrition focused physical exam.   Goal: Pt to consume >90% of meals/supplements  Monitor:  Weights, labs, intake   Reason for Assessment: Malnutrition screening tool   67 y.o. male  Admitting Dx: CHF (congestive heart failure)  ASSESSMENT: Pt with history of CAD status post CABG, CHF(EF not known), chronic kidney disease, anemia, hypertension who was admitted 3 weeks ago with complaints of right knee pain and swelling of the lower extremities presents with a similar complaints which patient states has further worsened. Patient also found to have uncontrolled hypertension.  Met with pt who reports eating 3 meals/day at home with good appetite. States he has lost 11 pounds in the past month which he thinks is r/t fluid weight. Noted pt with mild/moderate muscle wasting in clavicles, upper arms, hands, and ribs in nutrition focused physical exam. Pt eating 100% of meals.  Height: Ht Readings from Last 1 Encounters:  06/20/13 5\' 9"  (1.753 m)    Weight: Wt Readings from Last 1 Encounters:  06/20/13 128 lb (58.06 kg)    Ideal Body Weight: 160 lb   % Ideal Body Weight: 80%  Wt Readings from Last 10 Encounters:  06/20/13 128 lb (58.06 kg)  05/30/13 119 lb 7.8 oz (54.2 kg)    Usual Body Weight: 139 lb per pt  % Usual Body Weight: 92%  BMI:  Body mass index is 18.89  kg/(m^2).  Estimated Nutritional Needs: Kcal: 1800-1950 Protein: 70-90g Fluid: 1.5L/day per MD fluid restriction   Skin: +2 RLE edema, +3 LLE edema  Diet Order: Cardiac  EDUCATION NEEDS: -Education needs addressed - reviewed low sodium diet therapy for CHF and provided handouts of this information. Teach back method used, expect good compliance.    Intake/Output Summary (Last 24 hours) at 06/20/13 1557 Last data filed at 06/20/13 1328  Gross per 24 hour  Intake    480 ml  Output   2900 ml  Net  -2420 ml    Last BM: 12/21  Labs:   Recent Labs Lab 06/19/13 2206 06/20/13 0451  NA 144 143  K 3.8 3.4*  CL 107 103  CO2 29 30  BUN 25* 22  CREATININE 1.90* 1.88*  CALCIUM 8.6 8.7  GLUCOSE 75 87    CBG (last 3)  No results found for this basename: GLUCAP,  in the last 72 hours  Scheduled Meds: . amLODipine  10 mg Oral Daily  . aspirin EC  325 mg Oral Daily  . atorvastatin  20 mg Oral q1800  . carvedilol  25 mg Oral BID WC  . colchicine  0.3 mg Oral Daily  . furosemide  40 mg Intravenous Daily  . heparin  5,000 Units Subcutaneous Q8H  . hydrALAZINE  25 mg Oral Q8H  . methylPREDNISolone (SOLU-MEDROL) injection  20 mg Intravenous Daily  . mometasone-formoterol  2 puff Inhalation BID  . nitroGLYCERIN  0.4 mg Transdermal Daily  . pantoprazole  40 mg Oral Daily  .  pantoprazole  40 mg Oral Daily  . risperiDONE  1 mg Oral QPM  . sodium chloride  3 mL Intravenous Q12H  . sodium chloride  3 mL Intravenous Q12H    Continuous Infusions:   Past Medical History  Diagnosis Date  . Hypertension   . Hyperlipidemia   . CKD (chronic kidney disease)     a. Probable stage III.  Marland Kitchen CAD (coronary artery disease)     a. CABG 2000 in [place]. b. NSTEMI 04/2013 felt due to malignant hypertension (was instructed to f/u [facility] for stress test).  . Gout   . CHF (congestive heart failure)     a. Echo 04/2013: EF 45-50%, mild AS, mild biatrial enlargement, sev LVH.  . Osteoarthritis      R knee  . Anemia     Past Surgical History  Procedure Laterality Date  . Cardiac surgery      2000; [place]  . Appendectomy    . Abdominal exploration surgery    . Tonsillectomy      Levon Hedger MS, RD, LDN [phone] Pager [phone] After Hours Pager

## 2013-06-20 NOTE — H&P (Addendum)
[facility] History and Physical  Martin Cain ZOX:[id number] DOB: [birth year] DOA: 06/19/2013  Referring physician: ER physician. PCP: No primary provider on file.   Chief Complaint: Right knee pain and lower extremity swelling.  Patient is originally from [place] visiting his family.  HPI: Martin Cain is a 67 y.o. male with known history of CAD status post CABG, CHF(EF not known), chronic kidney disease, anemia, hypertension who was admitted 3 weeks ago with complaints of right knee pain and swelling of the lower extremities presents with a similar complaints which patient states has further worsened. During last visit patient had elevated troponins and cardiology was consulted and at that time was for the patient may have elevated troponins from chronic disease. Patient also found to have uncontrolled hypertension. Patient presents today again with similar complaints with increasing right knee pain and lower extremity edema. Patient's family also felt that patient may be slightly short of breath on exertion. Patient presently on my exam is not in acute distress and not short of breath at rest. Denies any chest pain nausea vomiting abdominal pain palpitations. Patient says that his right knee has swollen more than previous but its not looking inflamed on exam. Patient does have lower extremity edema more on the right side. Patient states he has gout and also osteoarthritis right knee and was originally scheduled to have surgery at [facility]. Chest x-ray the ER shows congestion and given patient's increased pain on the lower extremity ER physician had given Lasix 80 mg IV one dose. Patient has been admitted for further workup.  Review of Systems: As presented in the history of presenting illness, rest negative.  Past Medical History  Diagnosis Date  . Hypertension   . Hyperlipidemia   . Renal insufficiency   . CAD (coronary artery disease)   . Gout    Past Surgical History  Procedure  Laterality Date  . Cardiac surgery      2000; [place]  . Appendectomy    . Abdominal exploration surgery    . Tonsillectomy     Social History:  reports that he has been smoking Cigarettes.  He has a 20 pack-year smoking history. He has never used smokeless tobacco. He reports that he drinks alcohol. He reports that he does not use illicit drugs. Where does patient live home. Can patient participate in ADLs? Yes.  No Known Allergies  Family History:  Family History  Problem Relation Age of Onset  . Heart disease      No family history      Prior to Admission medications   Medication Sig Start Date End Date Taking? Authorizing Provider  albuterol (PROVENTIL HFA;VENTOLIN HFA) 108 (90 BASE) MCG/ACT inhaler Inhale 2 puffs into the lungs every 6 (six) hours as needed for wheezing or shortness of breath. 06/09/13  Yes Lars Masson, MD  amLODipine (NORVASC) 10 MG tablet Take 1 tablet (10 mg total) by mouth daily. 06/09/13  Yes Lars Masson, MD  aspirin 81 MG chewable tablet Chew by mouth daily.   Yes Historical Provider, MD  carvedilol (COREG) 25 MG tablet Take 1 tablet (25 mg total) by mouth 2 (two) times daily with a meal. 06/09/13  Yes Lars Masson, MD  Cholecalciferol (VITAMIN D PO) Take 1,000 mg by mouth daily.   Yes Historical Provider, MD  colchicine 0.6 MG tablet Take 0.5 tablets (0.3 mg total) by mouth daily. 05/31/13  Yes Adeline Joselyn Glassman, MD  Cyanocobalamin (VITAMIN B-12 PO) Take 1,000 mg by mouth daily.  Yes Historical Provider, MD  Fluticasone-Salmeterol (ADVAIR) 250-50 MCG/DOSE AEPB Inhale 1 puff into the lungs 2 (two) times daily. 06/09/13  Yes Lars Masson, MD  hydrALAZINE (APRESOLINE) 25 MG tablet Take 1 tablet (25 mg total) by mouth every 8 (eight) hours. 06/09/13  Yes Lars Masson, MD  nitroGLYCERIN (NITROSTAT) 0.4 MG SL tablet Place 0.4 mg under the tongue every 5 (five) minutes as needed for chest pain.   Yes Historical Provider, MD  omeprazole  (PRILOSEC) 40 MG capsule Take 1 capsule (40 mg total) by mouth daily. 05/31/13  Yes Adeline Joselyn Glassman, MD  oxyCODONE (OXY IR/ROXICODONE) 5 MG immediate release tablet Take 1 tablet (5 mg total) by mouth every 4 (four) hours as needed for moderate pain. 06/09/13  Yes Lars Masson, MD  oxyCODONE-acetaminophen (PERCOCET/ROXICET) 5-325 MG per tablet Take 1-2 tablets by mouth every 6 (six) hours as needed for severe pain. 06/09/13  Yes Heather Laisure, PA-C  risperiDONE (RISPERDAL M-TABS) 1 MG disintegrating tablet Take 1 tablet (1 mg total) by mouth every evening. 05/31/13  Yes Adeline Joselyn Glassman, MD  rosuvastatin (CRESTOR) 10 MG tablet Take 1 tablet (10 mg total) by mouth daily. 06/09/13  Yes Lars Masson, MD  predniSONE (DELTASONE) 20 MG tablet Take 1 tablet (20 mg total) by mouth daily with breakfast. 05/31/13   Kela Millin, MD    Physical Exam: Filed Vitals:   06/20/13 0100 06/20/13 0130 06/20/13 0200 06/20/13 0306  BP: 177/68 203/68 188/81 184/82  Pulse:    82  Temp:    97.8 F (36.6 C)  TempSrc:    Oral  Resp: 14 19 15 16   Height:    5\' 9"  (1.753 m)  Weight:    59.739 kg (131 lb 11.2 oz)  SpO2:    100%     General:  Well-developed and nourished.  Eyes: Anicteric no pallor.  ENT: No discharge from the ears eyes nose mouth.  Neck: No mass felt.  Cardiovascular: S1-S2 heard.  Respiratory: No rhonchi or crepitations.  Abdomen: Soft nontender bowel sounds present.  Skin: No rash.  Musculoskeletal: Right knee swelling but not warm to touch. There is no definite tenderness in the right knee area on palpation. Bilateral lower extremity edema.  Psychiatric: Appears normal.  Neurologic: Alert awake oriented to time place and person. Moves all extremities.  Labs on Admission:  Basic Metabolic Panel:  Recent Labs Lab 06/19/13 2206  NA 144  K 3.8  CL 107  CO2 29  GLUCOSE 75  BUN 25*  CREATININE 1.90*  CALCIUM 8.6   Liver Function Tests: No results found for  this basename: AST, ALT, ALKPHOS, BILITOT, PROT, ALBUMIN,  in the last 168 hours No results found for this basename: LIPASE, AMYLASE,  in the last 168 hours No results found for this basename: AMMONIA,  in the last 168 hours CBC:  Recent Labs Lab 06/19/13 2206  WBC 5.1  NEUTROABS 1.9  HGB 9.7*  HCT 31.0*  MCV 88.8  PLT 192   Cardiac Enzymes:  Recent Labs Lab 06/20/13 0151  CKTOTAL 886*  TROPONINI 0.79*    BNP (last 3 results)  Recent Labs  05/27/13 1005 06/19/13 2206  PROBNP 6983.0* 5677.0*   CBG: No results found for this basename: GLUCAP,  in the last 168 hours  Radiological Exams on Admission: Dg Chest 2 View  06/19/2013   CLINICAL DATA:  Shortness of breath, bilateral leg swelling  EXAM: CHEST  2 VIEW  COMPARISON:  Prior  radiograph from 05/27/2013.  FINDINGS: Median sternotomy wires with underlying surgical clips are present. There is mild to moderate cardiomegaly. Tortuosity of the intrathoracic aorta is noted.  Lungs are well inflated. There is mild diffuse prominence of the interstitial markings suggestive of mild interstitial edema. No Estuardo pulmonary edema. No focal infiltrates identified. Linear opacity within the left lung base is most compatible with atelectasis. No pneumothorax or pleural effusion.  Osseous structures are within normal limits.  IMPRESSION: Moderate cardiomegaly with mild diffuse prominence of the interstitial markings, suggestive of mild pulmonary interstitial edema.   Electronically Signed   By: Rise Mu M.D.   On: 06/19/2013 22:09    EKG: Independently reviewed. Normal sinus rhythm with LVH changes. EKG is comparable to old EKG.  Assessment/Plan Principal Problem:   CHF (congestive heart failure) Active Problems:   Renal insufficiency   Elevated troponin   Gout flare   HTN (hypertension)   1. CHF exacerbation - EF is unknown. I have continued with Lasix 40 mg IV daily. Closely follow intake output and metabolic panel. I  have also ordered 2-D echo and cycled cardiac markers. I have also ordered Dopplers of the lower extremity to check for DVT. 2. Elevated troponins with history of CAD status post CABG - patient has no chest pain at this time. I am cycling cardiac markers. I place patient on nitroglycerin patch as patient's blood pressure is uncontrolled Continue aspirin. Check 2-D echo. May get cardiology consult in a.m. 3. Right knee effusion - probably secondary to gout and osteoarthritis. Patient's knee does not look inflamed and patient does not septic and patient is afebrile and does not have leukocytosis. This time I have placed patient on IV Solu-Medrol 20 mg daily and continue his known dose of colchicine. Check knee x-rays. 4. Hypertension uncontrolled -patient has been continued on his home medications including IV Lasix and increased and nitroglycerin patch. Closely follow blood pressure trends. 5. Chronic kidney disease - closely follow creatinine intake output and metabolic panel. Creatinine appears to be at baseline. 6. Chronic anemia probably from kidney disease - closely follow CBC. Patient's hemoglobin has worsened from previous. ER physician stated that she has done stool for occult blood which was negative.    Code Status:Full code.  Family Communication: None.  Disposition Plan: Admit to inpatient.    Zi Sek N. [facility] Pager [phone].  If 7PM-7AM, please contact night-coverage [email or web address] Password [facility] 06/20/2013, 3:44 AM

## 2013-06-20 NOTE — Progress Notes (Signed)
MD at bedside with patient---VSs reported to MD and troponin level reported. MD assessed also assessed edematous legs and feet.  SRP, RN

## 2013-06-20 NOTE — Consult Note (Signed)
Cardiology Consultation Note  Patient ID: Martin Cain, MRN: [id number], DOB/AGE: 1946-04-09 67 y.o. Admit date: 06/19/2013   Date of Consult: 06/20/2013 Primary Physician: No primary provider on file. Primary Cardiologist: Dr. Junie Spencer in [place]  Chief Complaint: R knee pain, BLE swelling Reason for Consult: NSTEMI, CHF  HPI: Mr. 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 He has been in [place] since September visiting his daughter. 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 He was advised to have outpatient stress test when he goes back to [place] but has not gone for this yet. He saw Dr. Delton See in the office earlier this month and she instructed him to let us know if he wasn't going back to [facility] to call us to schedule but he hadn't done so yet. 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 2D echo (which has not populated in epic) from 04/2013 showed EF 45-50%, mild AS, mild biatrial enlargement, severe LVH. He has chronic problems with gout that starts in his big toe and goes to his right knee. He says he was supposed to have knee surgery 12/1 but this was postponed due to his heart issues and high blood pressure. Since being in the hospital he also reports increased SOB, but not prior to admission. He denies CP or pressure, or DOE. He was very active back in [place] but has been relatively inactive since moving to [place]. He otherwise denies recent exertional CP or SOB.   Workup thus far has revealed pBNP 5600, troponin peak 1.02, Hgb 9.6 (lower than 11-12 in November) with normal WBC but lymphocytosis, Cr 1.88. FOBT negative per ED notes. CXR suspicious for mild pulmonary interstitial edema.  He was treated with 80 then 40mg  IV Lasix thus far. Duplex neg for DVT. He was not on ACEI or diuretic PTA due to A/CKD last admission. Reports compliance with home meds. BP running high this admission from 170s to 203/68.  He also reports unintentional weight loss over the last several months - used to weight 139 his whole life, more recently 119 at home. Currently 128 lbs. Denies melena, BRBPR, nausea, vomiting, syncope, palpitations, orthopnea. Knee film shows joint space loss and possible knee effusion.  Past Medical History  Diagnosis Date  . Hypertension   . Hyperlipidemia   . CKD (chronic kidney disease)     a. Baseline Cr appears 1.9-2.2 by most recent labs.  Marland Kitchen CAD (coronary artery disease)     a. CABG 2000 in [place]. b. NSTEMI 04/2013 felt due to malignant hypertension (was instructed to f/u [facility] for stress test).  . Gout   . CHF (congestive heart failure)     a. Echo 04/2013: EF 45-50%, mild AS, mild biatrial enlargement, sev LVH.  . Osteoarthritis     R knee  . Anemia       Most Recent Cardiac Studies: Echo 04/2013 (I had Dr. Jens Som review): EF 45-50%, mild AS, mild biatrial enlargement, sev LVH.   Surgical History:  Past Surgical History  Procedure Laterality Date  . Cardiac surgery      2000; [place]  . Appendectomy    . Abdominal exploration surgery    .  Tonsillectomy       Home Meds: Prior to Admission medications   Medication Sig Start Date End Date Taking? Authorizing Provider  albuterol (PROVENTIL HFA;VENTOLIN HFA) 108 (90 BASE) MCG/ACT inhaler Inhale 2 puffs into the lungs every 6 (six) hours as needed for wheezing or shortness of breath. 06/09/13  Yes Lars Masson, MD  amLODipine (NORVASC) 10 MG tablet Take 1 tablet (10 mg total) by mouth daily. 06/09/13  Yes Lars Masson, MD  aspirin 81 MG chewable tablet Chew by mouth daily.   Yes Historical Provider, MD  carvedilol (COREG) 25 MG tablet Take 1 tablet (25 mg total) by mouth 2 (two) times daily  with a meal. 06/09/13  Yes Lars Masson, MD  Cholecalciferol (VITAMIN D PO) Take 1,000 mg by mouth daily.   Yes Historical Provider, MD  colchicine 0.6 MG tablet Take 0.5 tablets (0.3 mg total) by mouth daily. 05/31/13  Yes Adeline Joselyn Glassman, MD  Cyanocobalamin (VITAMIN B-12 PO) Take 1,000 mg by mouth daily.   Yes Historical Provider, MD  Fluticasone-Salmeterol (ADVAIR) 250-50 MCG/DOSE AEPB Inhale 1 puff into the lungs 2 (two) times daily. 06/09/13  Yes Lars Masson, MD  hydrALAZINE (APRESOLINE) 25 MG tablet Take 1 tablet (25 mg total) by mouth every 8 (eight) hours. 06/09/13  Yes Lars Masson, MD  nitroGLYCERIN (NITROSTAT) 0.4 MG SL tablet Place 0.4 mg under the tongue every 5 (five) minutes as needed for chest pain.   Yes Historical Provider, MD  omeprazole (PRILOSEC) 40 MG capsule Take 1 capsule (40 mg total) by mouth daily. 05/31/13  Yes Adeline Joselyn Glassman, MD  oxyCODONE (OXY IR/ROXICODONE) 5 MG immediate release tablet Take 1 tablet (5 mg total) by mouth every 4 (four) hours as needed for moderate pain. 06/09/13  Yes Lars Masson, MD  oxyCODONE-acetaminophen (PERCOCET/ROXICET) 5-325 MG per tablet Take 1-2 tablets by mouth every 6 (six) hours as needed for severe pain. 06/09/13  Yes Heather Laisure, PA-C  risperiDONE (RISPERDAL M-TABS) 1 MG disintegrating tablet Take 1 tablet (1 mg total) by mouth every evening. 05/31/13  Yes Adeline Joselyn Glassman, MD  rosuvastatin (CRESTOR) 10 MG tablet Take 1 tablet (10 mg total) by mouth daily. 06/09/13  Yes Lars Masson, MD  predniSONE (DELTASONE) 20 MG tablet Take 1 tablet (20 mg total) by mouth daily with breakfast. 05/31/13   Kela Millin, MD    Inpatient Medications:  . amLODipine  10 mg Oral Daily  . aspirin EC  325 mg Oral Daily  . atorvastatin  20 mg Oral q1800  . carvedilol  25 mg Oral BID WC  . colchicine  0.3 mg Oral Daily  . furosemide  40 mg Intravenous Daily  . heparin  5,000 Units Subcutaneous Q8H  . hydrALAZINE  25 mg  Oral Q8H  . methylPREDNISolone (SOLU-MEDROL) injection  20 mg Intravenous Daily  . mometasone-formoterol  2 puff Inhalation BID  . nitroGLYCERIN  0.4 mg Transdermal Daily  . pantoprazole  40 mg Oral Daily  . pantoprazole  40 mg Oral Daily  . risperiDONE  1 mg Oral QPM  . sodium chloride  3 mL Intravenous Q12H  . sodium chloride  3 mL Intravenous Q12H      Allergies: No Known Allergies  History   Social History  . Marital Status: Divorced    Spouse Name: N/A    Number of Children: 2  . Years of Education: N/A   Occupational History  . Not on file.  Social History Main Topics  . Smoking status: Current Every Day Smoker -- 0.50 packs/day for 40 years    Types: Cigarettes  . Smokeless tobacco: Never Used  . Alcohol Use: Yes     Comment: 1 pint per day   . Drug Use: No  . Sexual Activity: Not on file   Other Topics Concern  . Not on file   Social History Narrative  . No narrative on file     Family History  Problem Relation Age of Onset  . Heart disease      No family history     Review of Systems: General: negative for chills, fever Cardiovascular: see above Dermatological: negative for rash Respiratory: negative for cough or wheezing Urologic: negative for hematuria Abdominal: negative for nausea, vomiting, diarrhea, bright red blood per rectum, melena, or hematemesis Neurologic: negative for visual changes, syncope, or dizziness All other systems reviewed and are otherwise negative except as noted above.  Labs:  Recent Labs  06/20/13 0151 06/20/13 0451 06/20/13 1009  CKTOTAL 886*  --   --   TROPONINI 0.79* 0.92* 1.02*   Lab Results  Component Value Date   WBC 4.4 06/20/2013   HGB 9.6* 06/20/2013   HCT 30.0* 06/20/2013   MCV 88.8 06/20/2013   PLT 200 06/20/2013    Recent Labs Lab 06/20/13 0451  NA 143  K 3.4*  CL 103  CO2 30  BUN 22  CREATININE 1.88*  CALCIUM 8.7  PROT 6.4  BILITOT 0.3  ALKPHOS 50  ALT 10  AST 15  GLUCOSE 87     Radiology/Studies:  Dg Chest 2 View  06/19/2013   CLINICAL DATA:  Shortness of breath, bilateral leg swelling  EXAM: CHEST  2 VIEW  COMPARISON:  Prior radiograph from 05/27/2013.  FINDINGS: Median sternotomy wires with underlying surgical clips are present. There is mild to moderate cardiomegaly. Tortuosity of the intrathoracic aorta is noted.  Lungs are well inflated. There is mild diffuse prominence of the interstitial markings suggestive of mild interstitial edema. No [place] pulmonary edema. No focal infiltrates identified. Linear opacity within the left lung base is most compatible with atelectasis. No pneumothorax or pleural effusion.  Osseous structures are within normal limits.  IMPRESSION: Moderate cardiomegaly with mild diffuse prominence of the interstitial markings, suggestive of mild pulmonary interstitial edema.   Electronically Signed   By: Rise Mu M.D.   On: 06/19/2013 22:09   Dg Chest 2 View  05/27/2013   CLINICAL DATA:  Hypertension, peripheral swelling  EXAM: CHEST  2 VIEW  COMPARISON:  None  FINDINGS: Cardiac shadow is mildly enlarged. Postsurgical changes are seen. The lungs are well aerated bilaterally without focal infiltrate or sizable effusion. No acute bony abnormality is noted.  IMPRESSION: No acute abnormality noted.   Electronically Signed   By: Alcide Clever M.D.   On: 05/27/2013 10:10   Dg Sacrum/coccyx  06/09/2013   CLINICAL DATA:  Larey Seat backward yesterday.  Tailbone pain.  EXAM: SACRUM AND COCCYX - 2+ VIEW  COMPARISON:  Nine.  FINDINGS: No fracture. No dislocation. SI joints are normally aligned. No bone lesion is seen. There is evidence of an old healed fracture of the left pubis. Soft tissues show vascular calcifications.  IMPRESSION: No fracture or acute finding.   Electronically Signed   By: Amie Portland M.D.   On: 06/09/2013 19:00   Mr Brain Wo Contrast  05/30/2013   CLINICAL DATA:  Altered mental status. Confusion. High blood pressure and  hyperlipidemia.  Renal insufficiency.  EXAM: MRI HEAD WITHOUT CONTRAST  TECHNIQUE: Multiplanar, multiecho pulse sequences of the brain and surrounding structures were obtained without intravenous contrast.  COMPARISON:  None.  FINDINGS: Exam is motion degraded.  No acute infarct.  No intracranial hemorrhage.  Remote small right basal ganglia/thalamic infarct. Remote tiny right paracentral pontine infarct. Remote tiny left cerebellar infarct.  Mild small vessel disease type changes.  Atrophy. Ventricular prominence felt to be related to atrophy rather than hydrocephalus.  No intracranial mass lesion noted on this unenhanced exam.  Small right vertebral artery with superimposed atherosclerotic type changes and significant narrowing. Left vertebral artery, basilar artery and the internal carotid arteries as well as major dural sinuses are patent.  Minimal spinal stenosis C3-4. Cervical medullary junction, pituitary region, pineal region and orbital structures unremarkable.  Minimal paranasal sinus mucosal thickening.  IMPRESSION: Exam is motion degraded.  No acute infarct.  Remote small right basal ganglia/thalamic infarct. Remote tiny right paracentral pontine infarct. Remote tiny left cerebellar infarct.  Mild small vessel disease type changes.  Atrophy.  Small right vertebral artery with superimposed atherosclerotic type changes and significant narrowing.   Electronically Signed   By: Bridgett Larsson M.D.   On: 05/30/2013 17:15   Dg Knee Complete 4 Views Right  06/20/2013   CLINICAL DATA:  Pain all over the right knee. Prior remote knee injury.  EXAM: RIGHT KNEE - COMPLETE 4+ VIEW  COMPARISON:  05/28/2013  FINDINGS: No acute fracture or dislocation is identified. There may be a small persistent knee joint effusion. There is moderate to severe medial compartment joint space narrowing. Moderate patellofemoral and mild medial and lateral compartment osteophytosis are noted. Extensive vascular calcification is noted.   IMPRESSION: Moderate tricompartmental osteoarthrosis including moderate to severe medial compartment joint space loss. Possible small knee joint effusion.   Electronically Signed   By: Sebastian Ache   On: 06/20/2013 08:07   Dg Knee Complete 4 Views Right  05/28/2013   CLINICAL DATA:  Right knee pain.  EXAM: RIGHT KNEE - COMPLETE 4+ VIEW  COMPARISON:  None.  FINDINGS: The patient was in a flexed position and unable to straighten his leg.  Moderate tricompartmental degenerative changes are noted.  A small joint effusion is present.  No definite acute bony abnormality noted.  No suspicious focal bony lesions identified.  IMPRESSION: Decrease sensitivity secondary to patient's flexed position.  Moderate tricompartmental degenerative changes with small knee effusion.  No definite acute bony abnormality.   Electronically Signed   By: Laveda Abbe M.D.   On: 05/28/2013 20:18   Dg Foot Complete Right  05/28/2013   CLINICAL DATA:  Foot pain.  Renal insufficiency.  Gout.  EXAM: RIGHT FOOT COMPLETE - 3+ VIEW  COMPARISON:  None.  FINDINGS: Diffuse soft-tissue swelling is present. Mild osteopenia noted throughout the bones of the foot and ankle. This could be related to osteoporosis/ostemalacia/renal osteodystrophy. No acute bony abnormality is identified. No evidence of fracture or dislocation. Mild degenerative changes present about the foot and ankle. No evidence of gouty arthritis.  IMPRESSION: 1. Diffuse soft tissue swelling. 2. Abnormal bone mineralization, possibly related to the patient's renal insufficiency. 3. No acute abnormality identified.  No evidence of gout.   Electronically Signed   By: Maisie Fus  Register   On: 05/28/2013 13:48   EKG: 06/19/13 85bpm, LAE, LAFB, LVH with diffuse ST-T changes felt secondary to repol abnormality, anterior Q waves possbly due to LVH  Physical Exam: Blood pressure 148/72, pulse 88, temperature 98.2 F (  36.8 C), temperature source Oral, resp. rate 16, height 5\' 9"  (1.753 m),  weight 128 lb (58.06 kg), SpO2 100.00%. General: Well developed, thin AAM in no acute distress. Laying flat in bed Head: Normocephalic, atraumatic, sclera non-icteric, no xanthomas, nares are without discharge.  Neck: Negative for carotid bruits. JVD not elevated. Lungs: Diminished BS throughout. No obvious wheezes, rales, or rhonchi. Breathing is unlabored. Heart: RRR with S1 S2. Soft SEM throughout precordium. No rubs or gallops appreciated. Abdomen: Soft, non-tender, non-distended with normoactive bowel sounds. No hepatomegaly. No rebound/guarding. No obvious abdominal masses. Msk:  Strength and tone appear normal for age. Extremities: No clubbing or cyanosis. 1+ LLE swelling, RLE trace edema. Pedal pulses are 2+ and equal bilaterally. Neuro: Alert and oriented X 3. No facial asymmetry. No focal deficit. Moves all extremities spontaneously. Psych:  Responds to questions appropriately with a normal affect.   Assessment and Plan:   1. R knee gout flare 2. NSTEMI with history of CAD s/p CABG 2000 3. Mild acute on chronic combined systolic/diastolic CHF 4. HTN 5. Likely CKD stage III with proteinuria 6. Acute on chronic anemia (FOBT neg in ED) 7. Unintentional weight loss  8. Ongoing tobacco abuse, counseled 9. Former EtOH abuse  Signed, Ronie Spies PA-C 06/20/2013, 3:04 PM As above, patient seen and examined. Briefly he is a 67 year old male with a past medical history of coronary artery disease status post coronary artery bypassing graft, chronic combined systolic/diastolic congestive heart failure, hypertension, hyperlipidemia, chronic stage III renal failure and tobacco abuse who I am asked to evaluate for congestive heart failure and elevated troponin. Echocardiogram in November 2014 showed an ejection fraction of 45-50%, severe left ventricular hypertrophy, mild biatrial enlargement, mild aortic stenosis and trivial pericardial effusion. Patient denies dyspnea, chest pain or syncope.  He has noted worsening bilateral ankle edema. He continues to have pain in his right knee. Physical exam shows a lateral ankle edema and tenderness to palpation. There is a small right knee effusion. Chest x-ray showed mild edema. BUN and creatinine 22/1.88. Troponin 0.92. Electrocardiogram shows sinus rhythm, left anterior fascicular block, left atrial enlargement, left ventricular hypertrophy with repolarization abnormality. Patient is mildly volume overloaded on examination. Continue Lasix 40 mg IV daily. Follow renal function. Note patient developed worsening renal insufficiency with diuresis and ACE inhibitor on previous admission. Continue amlodipine, carvedilol and hydralazine for blood pressure. Increase hydralazine as needed for improved control. Elevated troponin appears to be chronic. He is not having chest pain. Plan outpatient nuclear study when he improved. His myocardium on previous echocardiogram had question speckled appearance. This may be related to renal insufficiency but amyloid should be considered. Check SPEP and UPEP. Most likely needs nephrology to follow as outpatient. Gout per primary care.  Olga Millers 3:42 PM

## 2013-06-20 NOTE — ED Notes (Signed)
POCT Occult blood stool results are NEGATIVE.

## 2013-06-20 NOTE — Progress Notes (Signed)
Bilateral lower extremity venous duplex:  No evidence of DVT, superficial thrombosis, or Baker's Cyst.   

## 2013-06-20 NOTE — Progress Notes (Signed)
Patient seen and examined. Admitted earlier today for LE edema and thought to have CHF given edema, CXR with vascular congestion and a BNP >5600. ECHO pending. Troponin has been rising: 0.79, 0.92, 1.02. He does have CKD Stage III and an acute CHF exac that could account for the rise in troponin. Will consult cards for further evaluation (had elevated trop prior admission as well). Continue lasix, aim for negative fluid balance. Will continue to follow.  Peggye Pitt, MD [facility] Pager: [phone]

## 2013-06-21 DIAGNOSIS — M25569 Pain in unspecified knee: Secondary | ICD-10-CM

## 2013-06-21 DIAGNOSIS — E440 Moderate protein-calorie malnutrition: Secondary | ICD-10-CM | POA: Insufficient documentation

## 2013-06-21 MED ORDER — HYDRALAZINE HCL 50 MG PO TABS
50.0000 mg | ORAL_TABLET | Freq: Three times a day (TID) | ORAL | Status: DC
  Administered 2013-06-21 – 2013-06-22 (×4): 50 mg via ORAL
  Filled 2013-06-21 (×6): qty 1

## 2013-06-21 MED ORDER — SPIRONOLACTONE 12.5 MG HALF TABLET
12.5000 mg | ORAL_TABLET | Freq: Every day | ORAL | Status: DC
  Administered 2013-06-21 – 2013-06-22 (×2): 12.5 mg via ORAL
  Filled 2013-06-21 (×2): qty 1

## 2013-06-21 MED ORDER — REGADENOSON 0.4 MG/5ML IV SOLN
0.4000 mg | Freq: Once | INTRAVENOUS | Status: AC
  Administered 2013-06-22: 0.4 mg via INTRAVENOUS
  Filled 2013-06-21: qty 5

## 2013-06-21 MED ORDER — PREDNISONE 20 MG PO TABS
30.0000 mg | ORAL_TABLET | Freq: Every day | ORAL | Status: DC
  Administered 2013-06-22: 08:00:00 30 mg via ORAL
  Filled 2013-06-21 (×2): qty 1

## 2013-06-21 NOTE — Progress Notes (Signed)
Spoke with Benna Dunks from [place] and arranged transportation for tomorrow for patient to go to [facility] for a stress test. Pt. To be at [facility] by 8am.

## 2013-06-21 NOTE — ED Provider Notes (Signed)
See prior note   Ward Givens, MD 06/21/13 [id number]

## 2013-06-21 NOTE — Progress Notes (Signed)
[facility] PROGRESS NOTE  Colter Magowan ZOX:[id number] DOB: Aug 21, 1945 DOA: 06/19/2013 PCP: No primary provider on file.  Assessment/Plan: Acute Combined CHF -Is 2.8 L negative since admission. -Feels much better.  Elevated Troponin -Appears to be chronic and may be related to her CKD and CHF. -Cards is planning on a stress test for tomorrow. -He has not had any CP since admission. -He does have a h/o CAD and is s/p CABG in 2000.  CKD Stage III -Cr is at baseline of 1.8-1.9.  HTN -BP remains elevated. -Will increase dose of hydralazine.  Right Knee Gout Flare -DC solumedrol and change to a quick prednisone taper.     Code Status: Full Code Family Communication: Patient only  Disposition Plan: To be determined   Consultants:  Cardiology   Antibiotics:  None   Subjective: No complaints.  Objective: Filed Vitals:   06/20/13 2143 06/21/13 0300 06/21/13 0514 06/21/13 0516  BP:   181/74 170/72  Pulse:   89 87  Temp:   97.8 F (36.6 C)   TempSrc:   Oral   Resp:   16   Height:      Weight:  58.695 kg (129 lb 6.4 oz)    SpO2: 98%  97%     Intake/Output Summary (Last 24 hours) at 06/21/13 1300 Last data filed at 06/21/13 0900  Gross per 24 hour  Intake    960 ml  Output   1250 ml  Net   -290 ml   Filed Weights   06/20/13 0306 06/20/13 0640 06/21/13 0300  Weight: 59.739 kg (131 lb 11.2 oz) 58.06 kg (128 lb) 58.695 kg (129 lb 6.4 oz)    Exam:   General:  AA Ox3  Cardiovascular: RRR  Respiratory: CTA B  Abdomen: S/NT/ND/+BS  Extremities: 1+ edema bilaterally   Neurologic:  Non-focal  Data Reviewed: Basic Metabolic Panel:  Recent Labs Lab 06/19/13 2206 06/20/13 0451  NA 144 143  K 3.8 3.4*  CL 107 103  CO2 29 30  GLUCOSE 75 87  BUN 25* 22  CREATININE 1.90* 1.88*  CALCIUM 8.6 8.7   Liver Function Tests:  Recent Labs Lab 06/20/13 0451  AST 15  ALT 10  ALKPHOS 50  BILITOT 0.3  PROT 6.4  ALBUMIN 2.8*   No results  found for this basename: LIPASE, AMYLASE,  in the last 168 hours No results found for this basename: AMMONIA,  in the last 168 hours CBC:  Recent Labs Lab 06/19/13 2206 06/20/13 0451  WBC 5.1 4.4  NEUTROABS 1.9 1.5*  HGB 9.7* 9.6*  HCT 31.0* 30.0*  MCV 88.8 88.8  PLT 192 200   Cardiac Enzymes:  Recent Labs Lab 06/20/13 0151 06/20/13 0451 06/20/13 1009 06/20/13 1544  CKTOTAL 886*  --   --   --   TROPONINI 0.79* 0.92* 1.02* 1.02*   BNP (last 3 results)  Recent Labs  05/27/13 1005 06/19/13 2206  PROBNP 6983.0* 5677.0*   CBG: No results found for this basename: GLUCAP,  in the last 168 hours  No results found for this or any previous visit (from the past 240 hour(s)).   Studies: Dg Chest 2 View  06/19/2013   CLINICAL DATA:  Shortness of breath, bilateral leg swelling  EXAM: CHEST  2 VIEW  COMPARISON:  Prior radiograph from 05/27/2013.  FINDINGS: Median sternotomy wires with underlying surgical clips are present. There is mild to moderate cardiomegaly. Tortuosity of the intrathoracic aorta is noted.  Lungs are well inflated. There is  mild diffuse prominence of the interstitial markings suggestive of mild interstitial edema. No [place] pulmonary edema. No focal infiltrates identified. Linear opacity within the left lung base is most compatible with atelectasis. No pneumothorax or pleural effusion.  Osseous structures are within normal limits.  IMPRESSION: Moderate cardiomegaly with mild diffuse prominence of the interstitial markings, suggestive of mild pulmonary interstitial edema.   Electronically Signed   By: Rise Mu M.D.   On: 06/19/2013 22:09   Dg Knee Complete 4 Views Right  06/20/2013   CLINICAL DATA:  Pain all over the right knee. Prior remote knee injury.  EXAM: RIGHT KNEE - COMPLETE 4+ VIEW  COMPARISON:  05/28/2013  FINDINGS: No acute fracture or dislocation is identified. There may be a small persistent knee joint effusion. There is moderate to severe  medial compartment joint space narrowing. Moderate patellofemoral and mild medial and lateral compartment osteophytosis are noted. Extensive vascular calcification is noted.  IMPRESSION: Moderate tricompartmental osteoarthrosis including moderate to severe medial compartment joint space loss. Possible small knee joint effusion.   Electronically Signed   By: Sebastian Ache   On: 06/20/2013 08:07    Scheduled Meds: . amLODipine  10 mg Oral Daily  . aspirin EC  325 mg Oral Daily  . atorvastatin  20 mg Oral q1800  . carvedilol  25 mg Oral BID WC  . colchicine  0.3 mg Oral Daily  . feeding supplement (ENSURE COMPLETE)  237 mL Oral BID BM  . furosemide  40 mg Intravenous Daily  . heparin  5,000 Units Subcutaneous Q8H  . hydrALAZINE  25 mg Oral Q8H  . methylPREDNISolone (SOLU-MEDROL) injection  20 mg Intravenous Daily  . mometasone-formoterol  2 puff Inhalation BID  . nitroGLYCERIN  0.4 mg Transdermal Daily  . pantoprazole  40 mg Oral Daily  . [START ON 06/22/2013] regadenoson  0.4 mg Intravenous Once  . risperiDONE  1 mg Oral QPM  . sodium chloride  3 mL Intravenous Q12H  . sodium chloride  3 mL Intravenous Q12H  . spironolactone  12.5 mg Oral Daily   Continuous Infusions:   Principal Problem:   Acute on chronic combined systolic and diastolic CHF (congestive heart failure) Active Problems:   Renal insufficiency   Elevated troponin   Gout flare   HTN (hypertension)   Acute on chronic combined systolic and diastolic heart failure   Malnutrition of moderate degree    Time spent: 35 minutes. Greater than 50% of this time was spent in direct contact with the patient coordinating care.    Martin Cain  [facility] Pager [phone]  If 7PM-7AM, please contact night-coverage at [email or web address], password [facility] 06/21/2013, 1:00 PM  LOS: 2 days

## 2013-06-21 NOTE — Progress Notes (Signed)
PROGRESS NOTE  Subjective:   Mr. 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 He has been in [place] since September visiting his daughter. 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 He was advised to have outpatient stress test when he goes back to [place] but has not gone for this yet. He saw Dr. Delton See in the office earlier this month and she instructed him to let us know if he wasn't going back to [facility] to call us to schedule but he hadn't done so yet. 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 2D echo (which has not populated in epic) from 04/2013 showed EF 45-50%, mild AS, mild biatrial enlargement, severe LVH.  He says he was supposed to have knee surgery 12/1 but this was postponed due to his heart issues and high blood pressure. Since being in the hospital he also reports increased SOB, but not prior to admission. He denies CP or pressure, or DOE. He was very active back in [place] but has been relatively inactive since moving to [place]. He otherwise denies recent exertional CP or SOB  He diuresed 1.9 liters overnight.  He is feeling much better.  No angina.   Objective:    Vital Signs:   Temp:  [97.8 F (36.6 C)-98.5 F (36.9 C)] 97.8 F (36.6 C) (12/23 0514) Pulse Rate:  [86-89] 87 (12/23 0516) Resp:  [16-18] 16 (12/23 0514) BP: (148-181)/(67-74) 170/72 mmHg (12/23 0516) SpO2:  [97 %-100 %] 97 % (12/23 0514) Weight:  [129 lb 6.4 oz (58.695 kg)] 129 lb 6.4 oz (58.695 kg) (12/23 0300)  Last BM Date: 06/19/13   24-hour weight change: Weight change: 6.4 oz (0.181 kg)  Weight trends: Filed Weights   06/20/13 0306 06/20/13 0640 06/21/13 0300  Weight: 131 lb 11.2 oz  (59.739 kg) 128 lb (58.06 kg) 129 lb 6.4 oz (58.695 kg)    Intake/Output:  12/22 0701 - 12/23 0700 In: 840 [P.O.:840] Out: 1850 [Urine:1850]     Physical Exam: BP 170/72  Pulse 87  Temp(Src) 97.8 F (36.6 C) (Oral)  Resp 16  Ht 5\' 9"  (1.753 m)  Wt 129 lb 6.4 oz (58.695 kg)  BMI 19.10 kg/m2  SpO2 97%  General: Vital signs reviewed and noted.   Head: Normocephalic, atraumatic.  Eyes: conjunctivae/corneas clear.  EOM's intact.   Throat: normal  Neck:  normal, no JVD  Lungs:    clear  Heart:  RR, 2 / 6 systolic murmur  Abdomen:  Soft, non-tender, non-distended  , thin  Extremities: No edema   Neurologic: A&O X3, CN II - XII are grossly intact.   Psych: Normal     Labs: BMET:  Recent Labs  06/19/13 2206 06/20/13 0451  NA 144 143  K 3.8 3.4*  CL 107 103  CO2 29 30  GLUCOSE 75 87  BUN 25* 22  CREATININE 1.90* 1.88*  CALCIUM 8.6 8.7    Liver function tests:  Recent Labs  06/20/13 0451  AST 15  ALT 10  ALKPHOS 50  BILITOT 0.3  PROT 6.4  ALBUMIN 2.8*   No results found for this basename: LIPASE,  AMYLASE,  in the last 72 hours  CBC:  Recent Labs  06/19/13 2206 06/20/13 0451  WBC 5.1 4.4  NEUTROABS 1.9 1.5*  HGB 9.7* 9.6*  HCT 31.0* 30.0*  MCV 88.8 88.8  PLT 192 200    Cardiac Enzymes:  Recent Labs  06/20/13 0151 06/20/13 0451 06/20/13 1009 06/20/13 1544  CKTOTAL 886*  --   --   --   TROPONINI 0.79* 0.92* 1.02* 1.02*    Coagulation Studies: No results found for this basename: LABPROT, INR,  in the last 72 hours  Other: No components found with this basename: POCBNP,  No results found for this basename: DDIMER,  in the last 72 hours No results found for this basename: HGBA1C,  in the last 72 hours No results found for this basename: CHOL, HDL, LDLCALC, TRIG, CHOLHDL,  in the last 72 hours No results found for this basename: TSH, T4TOTAL, FREET3, T3FREE, THYROIDAB,  in the last 72 hours No results found for this basename:  VITAMINB12, FOLATE, FERRITIN, TIBC, IRON, RETICCTPCT,  in the last 72 hours   Other results: Tele:  NSR at 77 Medications:    Infusions:    Scheduled Medications: . amLODipine  10 mg Oral Daily  . aspirin EC  325 mg Oral Daily  . atorvastatin  20 mg Oral q1800  . carvedilol  25 mg Oral BID WC  . colchicine  0.3 mg Oral Daily  . feeding supplement (ENSURE COMPLETE)  237 mL Oral BID BM  . furosemide  40 mg Intravenous Daily  . heparin  5,000 Units Subcutaneous Q8H  . hydrALAZINE  25 mg Oral Q8H  . methylPREDNISolone (SOLU-MEDROL) injection  20 mg Intravenous Daily  . mometasone-formoterol  2 puff Inhalation BID  . nitroGLYCERIN  0.4 mg Transdermal Daily  . pantoprazole  40 mg Oral Daily  . pantoprazole  40 mg Oral Daily  . risperiDONE  1 mg Oral QPM  . sodium chloride  3 mL Intravenous Q12H  . sodium chloride  3 mL Intravenous Q12H    Assessment/ Plan:    1. Combined systolic and diastolic CHF:  Patient is feeling much better.  Troponin is elevated. Continue coreg 25 bid, lasix, hydralazine 25 Q 8.   Not on ACE-inhibitor due to CKD.  GFR is 40. We may be able to add low dose aldactone 12. 5 daily.   Watch K.  2. CAD:  Hx of CABG in [place] in 2000.  His Troponin levels have been mildly elevated chronically ( during last admission also). This may be due to his chf and renal insufficiency.  Will need a myoview at some point.  Will try to get tomorrow.  3. Weight loss:  Further work up per medicine.    Disposition:  Length of Stay: 2  Vesta Mixer, Montez Hageman., MD, [facility] 06/21/2013, 7:33 AM Office [phone] Pager [phone]

## 2013-06-22 ENCOUNTER — Inpatient Hospital Stay (HOSPITAL_COMMUNITY)
Admit: 2013-06-22 | Discharge: 2013-06-22 | Disposition: A | Discharge status: Home / Self Care | Payer: Medicare HMO | Attending: Cardiovascular Disease | Admitting: Cardiovascular Disease

## 2013-06-22 ENCOUNTER — Other Ambulatory Visit: Payer: Self-pay

## 2013-06-22 DIAGNOSIS — N189 Chronic kidney disease, unspecified: Secondary | ICD-10-CM

## 2013-06-22 DIAGNOSIS — I2510 Atherosclerotic heart disease of native coronary artery without angina pectoris: Secondary | ICD-10-CM

## 2013-06-22 DIAGNOSIS — I10 Essential (primary) hypertension: Secondary | ICD-10-CM

## 2013-06-22 DIAGNOSIS — N179 Acute kidney failure, unspecified: Secondary | ICD-10-CM

## 2013-06-22 DIAGNOSIS — N289 Disorder of kidney and ureter, unspecified: Secondary | ICD-10-CM

## 2013-06-22 LAB — CBC
HCT: 28.6 % — ABNORMAL LOW (ref 39.0–52.0)
Platelets: 190 10*3/uL (ref 150–400)
RBC: 3.27 MIL/uL — ABNORMAL LOW (ref 4.22–5.81)
RDW: 14 % (ref 11.5–15.5)
WBC: 6.1 10*3/uL (ref 4.0–10.5)

## 2013-06-22 LAB — PROTEIN ELECTROPHORESIS, SERUM
Albumin ELP: 49.6 % — ABNORMAL LOW (ref 55.8–66.1)
Alpha-1-Globulin: 5.7 % — ABNORMAL HIGH (ref 2.9–4.9)
Beta 2: 7.7 % — ABNORMAL HIGH (ref 3.2–6.5)
Gamma Globulin: 18.5 % (ref 11.1–18.8)
M-Spike, %: NOT DETECTED g/dL

## 2013-06-22 LAB — BASIC METABOLIC PANEL
CO2: 32 mEq/L (ref 19–32)
Calcium: 8.7 mg/dL (ref 8.4–10.5)
Chloride: 97 mEq/L (ref 96–112)
Creatinine, Ser: 2.12 mg/dL — ABNORMAL HIGH (ref 0.50–1.35)
GFR calc Af Amer: 35 mL/min — ABNORMAL LOW (ref >90)
Sodium: 139 mEq/L (ref 135–145)

## 2013-06-22 MED ORDER — ASPIRIN 325 MG PO TBEC: 325.0000 mg | DELAYED_RELEASE_TABLET | Freq: Every day | ORAL | Status: DC

## 2013-06-22 MED ORDER — AMLODIPINE BESYLATE 10 MG PO TABS: 10.0000 mg | ORAL_TABLET | Freq: Every day | ORAL | Status: DC

## 2013-06-22 MED ORDER — HYDRALAZINE HCL 50 MG PO TABS: 50.0000 mg | ORAL_TABLET | Freq: Three times a day (TID) | ORAL | Status: DC

## 2013-06-22 MED ORDER — REGADENOSON 0.4 MG/5ML IV SOLN
INTRAVENOUS | Status: AC
  Administered 2013-06-22: 0.4 mg
  Filled 2013-06-22: qty 5

## 2013-06-22 MED ORDER — CARVEDILOL 25 MG PO TABS: 25.0000 mg | ORAL_TABLET | Freq: Two times a day (BID) | ORAL | Status: DC

## 2013-06-22 MED ORDER — TECHNETIUM TC 99M SESTAMIBI GENERIC - CARDIOLITE
30.0000 | Freq: Once | INTRAVENOUS | Status: AC | PRN
  Administered 2013-06-22: 30 via INTRAVENOUS

## 2013-06-22 MED ORDER — TECHNETIUM TC 99M SESTAMIBI GENERIC - CARDIOLITE
10.0000 | Freq: Once | INTRAVENOUS | Status: AC | PRN
  Administered 2013-06-22: 10 via INTRAVENOUS

## 2013-06-22 MED ORDER — FUROSEMIDE 40 MG PO TABS
40.0000 mg | ORAL_TABLET | Freq: Every day | ORAL | Status: DC
  Administered 2013-06-22: 13:00:00 40 mg via ORAL
  Filled 2013-06-22: qty 1

## 2013-06-22 MED ORDER — FUROSEMIDE 20 MG PO TABS: 20.0000 mg | ORAL_TABLET | Freq: Every day | ORAL | Status: DC

## 2013-06-22 MED ORDER — PANTOPRAZOLE SODIUM 40 MG PO TBEC: 40.0000 mg | DELAYED_RELEASE_TABLET | Freq: Every day | ORAL | Status: DC

## 2013-06-22 MED ORDER — NITROGLYCERIN 0.4 MG/HR TD PT24: 0.4000 mg | MEDICATED_PATCH | Freq: Every day | TRANSDERMAL | Status: DC

## 2013-06-22 NOTE — Discharge Summary (Signed)
Physician Discharge Summary  Patient ID: Martin Cain MRN: [id number] DOB/AGE: 69/26/1947 69 y.o.  Admit date: 06/19/2013 Discharge date: 06/22/2013  Primary Care Physician:  No primary provider on file.  Discharge Diagnoses:    . Acute on chronic combined systolic and diastolic CHF (congestive heart failure) . Gout flare . Elevated troponin, not NSTEMI, likely due to chronic renal insufficiency  . HTN (hypertension) . Renal insufficiency  Consults: Cardiology, Dr. Elease Hashimoto   Recommendations for Outpatient Follow-up:   Patient needs to follow up with Dr. Delton See in cardiology office Obtain renal panel for BMET  Allergies:  No Known Allergies   Discharge Medications:   Medication List    STOP taking these medications       aspirin 81 MG chewable tablet  Replaced by:  aspirin 325 MG EC tablet      TAKE these medications       albuterol 108 (90 BASE) MCG/ACT inhaler  Commonly known as:  PROVENTIL HFA;VENTOLIN HFA  Inhale 2 puffs into the lungs every 6 (six) hours as needed for wheezing or shortness of breath.     amLODipine 10 MG tablet  Commonly known as:  NORVASC  Take 1 tablet (10 mg total) by mouth daily.     aspirin 325 MG EC tablet  Take 1 tablet (325 mg total) by mouth daily.     carvedilol 25 MG tablet  Commonly known as:  COREG  Take 1 tablet (25 mg total) by mouth 2 (two) times daily with a meal.     colchicine 0.6 MG tablet  Take 0.5 tablets (0.3 mg total) by mouth daily.     Fluticasone-Salmeterol 250-50 MCG/DOSE Aepb  Commonly known as:  ADVAIR  Inhale 1 puff into the lungs 2 (two) times daily.     furosemide 20 MG tablet  Commonly known as:  LASIX  Take 1 tablet (20 mg total) by mouth daily.  Start taking on:  06/23/2013     hydrALAZINE 50 MG tablet  Commonly known as:  APRESOLINE  Take 1 tablet (50 mg total) by mouth 3 (three) times daily.     nitroGLYCERIN 0.4 MG SL tablet  Commonly known as:  NITROSTAT  Place 0.4 mg under the tongue  every 5 (five) minutes as needed for chest pain.     omeprazole 40 MG capsule  Commonly known as:  PRILOSEC  Take 1 capsule (40 mg total) by mouth daily.     oxyCODONE 5 MG immediate release tablet  Commonly known as:  Oxy IR/ROXICODONE  Take 1 tablet (5 mg total) by mouth every 4 (four) hours as needed for moderate pain.     oxyCODONE-acetaminophen 5-325 MG per tablet  Commonly known as:  PERCOCET/ROXICET  Take 1-2 tablets by mouth every 6 (six) hours as needed for severe pain.     pantoprazole 40 MG tablet  Commonly known as:  PROTONIX  Take 1 tablet (40 mg total) by mouth daily.     predniSONE 20 MG tablet  Commonly known as:  DELTASONE  Take 1 tablet (20 mg total) by mouth daily with breakfast.     risperiDONE 1 MG disintegrating tablet  Commonly known as:  RISPERDAL M-TABS  Take 1 tablet (1 mg total) by mouth every evening.     rosuvastatin 10 MG tablet  Commonly known as:  CRESTOR  Take 1 tablet (10 mg total) by mouth daily.     VITAMIN B-12 PO  Take 1,000 mg by mouth daily.  VITAMIN D PO  Take 1,000 mg by mouth daily.         Brief H and P: For complete details please refer to admission H and P, but in brief Martin Cain is a 67 y.o. male with known history of CAD status post CABG, CHF(EF not known), chronic kidney disease, anemia, hypertension who was admitted 3 weeks ago with complaints of right knee pain and swelling of the lower extremities presents with a similar complaints which patient states has further worsened. During last visit patient had elevated troponins and cardiology was consulted and at that time was for the patient may have elevated troponins from chronic disease. Patient also found to have uncontrolled hypertension. Patient presented with similar complaints with increasing right knee pain and lower extremity edema. Patient's family also felt that patient may be slightly short of breath on exertion. Denied any chest pain nausea vomiting abdominal  pain palpitations. Patient reported his right knee has swollen more than previous but its not looking inflamed on exam. Patient does have lower extremity edema more on the right side. Patient stated he has gout and also osteoarthritis right knee and was originally scheduled to have surgery at [facility]. Chest x-ray the ER showed congestion and given patient's increased pain on the lower extremity ER physician had given Lasix 80 mg IV one dose. Patient has been admitted for further workup.  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 Patient had been in the hospital in November with end STEMI and CHF related to malignant hypertension. Patient had also seen Dr. Delton See in the office and was recommended outpatient stress test.  Acute Combined CHF : Patient was admitted and placed on IV diuresis, subsequently placed on Lasix and spironolactone. He is -5.4 L since admission. Creatinine has trended up to 2.1 at this time. Patient was not on Lasix prior to admission. Patient had nuclear medicine stress test today, which showed EF of 42%, matched attenuation involving the inferior wall of left ventricle without discrete wall motion abnormality no evidence of prior infarction or pharmacologically induced ischemia. I discussed in detail with Dr. Elease Hashimoto who had seen the patient today, recommended to discontinue or decrease Lasix as his creatinine has started trending up. Patient is not on ACE or ARB secondary to chronic renal insufficiency. I called [facility] for appointment however the office is closed. [facility] office will call patient for appointment on Friday, 08/25/2012. He intends to stay in [place] to [place]. Lasix will be titrated at the appointment with BMET.   Elevated Troponin -Appears to be chronic and may be related to her CKD and CHF. Patient underwent a  medicine stress test, EF of 42%. He will continue to follow with his cardiologist in [place] once he is back. He does have a h/o CAD and is s/p CABG in 2000.   CKD Stage III  -Cr is at baseline of 1.8-1.9. Creatinine has trended up to 2.1 today Spironolactone was discontinued, decrease Lasix to 20 mg daily.   HTN  -BP  was somewhat uncontrolled, the patient was given all the prescription of antihypertensives, hydralazine dose was increased.   Right Knee Gout Flare Improved Solu-Medrol was discontinued, change to a quick prednisone taper.      Day of Discharge BP 146/57  Pulse 85  Temp(Src) 97.8 F (36.6 C) (Oral)  Resp 16  Ht 5\' 9"  (1.753 m)  Wt 58.695 kg (129 lb 6.4 oz)  BMI 19.10 kg/m2  SpO2 100%  Physical Exam: General: Alert and awake oriented x3 not in any acute distress. CVS: S1-S2 clear no murmur rubs or gallops Chest: clear to auscultation bilaterally, no wheezing rales or rhonchi Abdomen: soft nontender, nondistended, normal bowel sounds Extremities: no cyanosis, clubbing or edema noted bilaterally Neuro: Cranial nerves II-XII intact, no focal neurological deficits   The results of significant diagnostics from this hospitalization (including imaging, microbiology, ancillary and laboratory) are listed below for reference.    LAB RESULTS: Basic Metabolic Panel:  Recent Labs Lab 06/20/13 0451 06/22/13 0423  NA 143 139  K 3.4* 3.8  CL 103 97  CO2 30 32  GLUCOSE 87 104*  BUN 22 44*  CREATININE 1.88* 2.12*  CALCIUM 8.7 8.7   Liver Function Tests:  Recent Labs Lab 06/20/13 0451  AST 15  ALT 10  ALKPHOS 50  BILITOT 0.3  PROT 6.4  ALBUMIN 2.8*   No results found for this basename: LIPASE, AMYLASE,  in the last 168 hours No results found for this basename: AMMONIA,  in the last 168 hours CBC:  Recent Labs Lab 06/20/13 0451 06/22/13 0423  WBC 4.4 6.1  NEUTROABS 1.5*  --   HGB 9.6* 9.3*  HCT 30.0* 28.6*  MCV 88.8 87.5  PLT 200 190   Cardiac  Enzymes:  Recent Labs Lab 06/20/13 0151  06/20/13 1009 06/20/13 1544  CKTOTAL 886*  --   --   --   TROPONINI 0.79*  < > 1.02* 1.02*  < > = values in this interval not displayed. BNP: No components found with this basename: POCBNP,  CBG: No results found for this basename: GLUCAP,  in the last 168 hours  Significant Diagnostic Studies:  Nm Myocar Multi W/spect W/wall Motion / Ef  06/22/2013   CLINICAL DATA:  History of CAD and CABG (2000), history of hypertension and CHF  EXAM: MYOCARDIAL IMAGING WITH SPECT (REST AND PHARMACOLOGIC-STRESS)  GATED LEFT VENTRICULAR WALL MOTION STUDY  LEFT VENTRICULAR EJECTION FRACTION  TECHNIQUE: Standard myocardial SPECT imaging was performed after resting intravenous injection of 10 mCi Tc-46m sestamibi. Subsequently, intravenous infusion of Lexiscan was performed under the supervision of the Cardiology staff. At peak effect of the drug, 30 mCi Tc-44m sestamibi was injected intravenously and standard myocardial SPECT imaging was performed. Quantitative gated imaging was also performed to evaluate left ventricular wall motion, and estimate left ventricular ejection fraction.  COMPARISON:  Chest radiograph - 06/19/2013  FINDINGS: Review of the rotational raw images demonstrates mild patient motion artifact on the provided rest images.  SPECT imaging demonstrates mild dilatation of the left ventricle. There is grossly matched attenuation involving the inferior wall of the left ventricle which is without associated wall motion abnormality. No definitive scintigraphic evidence of prior infarction or pharmacologically induced ischemia.  Quantitative gated analysis shows mild global hypokinesia. The resting left ventricular ejection fraction is 42% with end-diastolic volume of 191 ml and end-systolic volume of 111 ml.  IMPRESSION: 1. Matched attenuation involving the inferior wall of the left ventricle without associated discrete wall motion abnormality. No definite  scintigraphic evidence of prior infarction or pharmacologically induced ischemia. 2. Mild dilatation of the left ventricular cavity (end-diastolic volume - 191 ml). 3. Mild global hypokinesia.  Ejection fraction - 42%.   Electronically Signed   By: Simonne Come M.D.   On: 06/22/2013 11:59       Disposition and Follow-up:  Discharge Orders   Future Orders Complete By Expires   (HEART FAILURE PATIENTS) Call MD:  Anytime you have any of the following symptoms: 1) 3 pound weight gain in 24 hours or 5 pounds in 1 week 2) shortness of breath, with or without a dry hacking cough 3) swelling in the hands, feet or stomach 4) if you have to sleep on extra pillows at night in order to breathe.  As directed    Diet - low sodium heart healthy  As directed    Increase activity slowly  As directed        DISPOSITION: Home  DIET: Heart healthy   TESTS THAT NEED FOLLOW-UP BMET   DISCHARGE FOLLOW-UP Follow-up Information   Follow up with Tobias Alexander, H, MD. Schedule an appointment as soon as possible for a visit in 2 weeks. (please call office on Friday on 12/26 for appt)    Specialty:  Cardiology   Contact information:   [address] [place] [address] [phone]       Time spent on Discharge: 40 minutes   Signed:   Shanisha Lech M.D. [facility] 06/22/2013, 2:49 PM Pager: [phone]

## 2013-06-22 NOTE — Progress Notes (Signed)
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 with complaints of R knee pain and LE swelling. He has been in [place] since September visiting his daughter. 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 He was advised to have outpatient stress test when he goes back to [place] but has not gone for this yet. He saw Dr. Delton See in the office earlier this month and she instructed him to let us know if he wasn't going back to [facility] to call us to schedule but he hadn't done so yet. 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 2D echo (which has not populated in epic) from 04/2013 showed EF 45-50%, mild AS, mild biatrial enlargement, severe LVH.  He says he was supposed to have knee surgery 12/1 but this was postponed due to his heart issues and high blood pressure. Since being in the hospital he also reports increased SOB, but not prior to admission. He denies CP or pressure, or DOE. He was very active back in [place] but has been relatively inactive since moving to [place]. He otherwise denies recent exertional CP or SOB  He diuresed 1.9 liters Monday night and 2.0 liters Tuesday night.    He is feeling much better.  No angina.   Objective:    Vital Signs:   Temp:  [97.8 F (36.6 C)-98.3 F (36.8 C)] 98.3 F (36.8 C) (12/24 0624) Pulse Rate:  [71-86] 71 (12/24 0624) Resp:  [18] 18 (12/23 2035) BP: (149-156)/(63-65) 156/64 mmHg (12/24 0624) SpO2:  [100 %] 100 % (12/24 0624)  Last BM Date: 06/19/13   24-hour weight change: Weight change:   Weight trends: Filed Weights   06/20/13 0306 06/20/13 0640 06/21/13 0300  Weight: 131 lb 11.2 oz (59.739 kg) 128 lb (58.06 kg) 129 lb 6.4 oz (58.695 kg)     Intake/Output:  12/23 0701 - 12/24 0700 In: 840 [P.O.:840] Out: 2850 [Urine:2850]     Physical Exam: BP 156/64  Pulse 71  Temp(Src) 98.3 F (36.8 C) (Oral)  Resp 18  Ht 5\' 9"  (1.753 m)  Wt 129 lb 6.4 oz (58.695 kg)  BMI 19.10 kg/m2  SpO2 100%  General: Vital signs reviewed and noted.   Head: Normocephalic, atraumatic.  Eyes: conjunctivae/corneas clear.  EOM's intact.   Throat: normal  Neck:  normal, no JVD  Lungs:  clear  Heart:  RR, 2 / 6 systolic murmur  Abdomen:  Soft, non-tender, non-distended  , thin  Extremities: No edema   Neurologic: A&O X3, CN II - XII are grossly intact.   Psych: Normal     Labs: BMET:  Recent Labs  06/20/13 0451 06/22/13 0423  NA 143 139  K 3.4* 3.8  CL 103 97  CO2 30 32  GLUCOSE 87 104*  BUN 22 44*  CREATININE 1.88* 2.12*  CALCIUM 8.7 8.7    Liver function tests:  Recent Labs  06/20/13 0451  AST 15  ALT 10  ALKPHOS 50  BILITOT 0.3  PROT 6.4  ALBUMIN 2.8*   No results found for this basename: LIPASE, AMYLASE,  in the last 72 hours  CBC:  Recent Labs  06/19/13  2206 06/20/13 0451 06/22/13 0423  WBC 5.1 4.4 6.1  NEUTROABS 1.9 1.5*  --   HGB 9.7* 9.6* 9.3*  HCT 31.0* 30.0* 28.6*  MCV 88.8 88.8 87.5  PLT 192 200 190    Cardiac Enzymes:  Recent Labs  06/20/13 0151 06/20/13 0451 06/20/13 1009 06/20/13 1544  CKTOTAL 886*  --   --   --   TROPONINI 0.79* 0.92* 1.02* 1.02*    Coagulation Studies: No results found for this basename: LABPROT, INR,  in the last 72 hours  Other: No components found with this basename: POCBNP,  No results found for this basename: DDIMER,  in the last 72 hours No results found for this basename: HGBA1C,  in the last 72 hours No results found for this basename: CHOL, HDL, LDLCALC, TRIG, CHOLHDL,  in the last 72 hours No results found for this basename: TSH, T4TOTAL, FREET3, T3FREE, THYROIDAB,  in the last 72 hours No results found for this basename: VITAMINB12, FOLATE,  FERRITIN, TIBC, IRON, RETICCTPCT,  in the last 72 hours   Other results: Tele:  NSR at 77 Medications:    Infusions:    Scheduled Medications: . amLODipine  10 mg Oral Daily  . aspirin EC  325 mg Oral Daily  . atorvastatin  20 mg Oral q1800  . carvedilol  25 mg Oral BID WC  . colchicine  0.3 mg Oral Daily  . feeding supplement (ENSURE COMPLETE)  237 mL Oral BID BM  . furosemide  40 mg Intravenous Daily  . heparin  5,000 Units Subcutaneous Q8H  . hydrALAZINE  50 mg Oral Q8H  . mometasone-formoterol  2 puff Inhalation BID  . nitroGLYCERIN  0.4 mg Transdermal Daily  . pantoprazole  40 mg Oral Daily  . predniSONE  30 mg Oral Q breakfast  . regadenoson  0.4 mg Intravenous Once  . risperiDONE  1 mg Oral QPM  . sodium chloride  3 mL Intravenous Q12H  . sodium chloride  3 mL Intravenous Q12H  . spironolactone  12.5 mg Oral Daily    Assessment/ Plan:    1. Combined systolic and diastolic CHF:  Patient is feeling much better.  Troponin is elevated.  His troponin may be chronically elevated.   Continue coreg 25 bid, hydralazine 25 Q 8.   Not on ACE-inhibitor due to CKD.  His creatinine is mildly elevated.   Will hold IV lasix today.  He may need a low dose of PO lasix at discharge.  He was not on PO lasix at home according to med rec.   2. CAD:  Hx of CABG in [place] in 2000.  His Troponin levels have been mildly elevated chronically ( during last admission also). This may be due to his chf and renal insufficiency.   myoview  Today.  He should be able to go home if this does not reveal any significant ischemia.    3. Weight loss:  Further work up per medicine.    Disposition:  Length of Stay: 3  Vesta Mixer, Montez Hageman., MD, [facility] 06/22/2013, 7:34 AM Office [phone] Pager [phone]

## 2013-06-28 ENCOUNTER — Ambulatory Visit (INDEPENDENT_AMBULATORY_CARE_PROVIDER_SITE_OTHER): Payer: Medicare HMO | Admitting: Cardiology

## 2013-06-28 ENCOUNTER — Encounter: Payer: Self-pay | Admitting: Cardiology

## 2013-06-28 VITALS — BP 162/76 | HR 99 | Ht 69.0 in | Wt 124.0 lb

## 2013-06-28 DIAGNOSIS — I10 Essential (primary) hypertension: Secondary | ICD-10-CM

## 2013-06-28 DIAGNOSIS — I5043 Acute on chronic combined systolic (congestive) and diastolic (congestive) heart failure: Secondary | ICD-10-CM

## 2013-06-28 DIAGNOSIS — I509 Heart failure, unspecified: Secondary | ICD-10-CM

## 2013-06-28 LAB — COMPREHENSIVE METABOLIC PANEL
ALT: 14 U/L (ref 0–53)
AST: 16 U/L (ref 0–37)
Albumin: 3.4 g/dL — ABNORMAL LOW (ref 3.5–5.2)
Alkaline Phosphatase: 42 U/L (ref 39–117)
BUN: 30 mg/dL — ABNORMAL HIGH (ref 6–23)
CO2: 28 mEq/L (ref 19–32)
Calcium: 8.7 mg/dL (ref 8.4–10.5)
Chloride: 109 mEq/L (ref 96–112)
Creatinine, Ser: 1.8 mg/dL — ABNORMAL HIGH (ref 0.4–1.5)
GFR: 48.33 mL/min — ABNORMAL LOW (ref >60.00)
Glucose, Bld: 84 mg/dL (ref 70–99)
Potassium: 4 mEq/L (ref 3.5–5.1)
Sodium: 144 mEq/L (ref 135–145)
Total Bilirubin: 0.5 mg/dL (ref 0.3–1.2)
Total Protein: 6.8 g/dL (ref 6.0–8.3)

## 2013-06-28 MED ORDER — ALBUTEROL SULFATE HFA 108 (90 BASE) MCG/ACT IN AERS: 2.0000 | INHALATION_SPRAY | Freq: Four times a day (QID) | RESPIRATORY_TRACT | Status: DC | PRN

## 2013-06-28 MED ORDER — CARVEDILOL 25 MG PO TABS: 25.0000 mg | ORAL_TABLET | Freq: Two times a day (BID) | ORAL | Status: DC

## 2013-06-28 NOTE — Patient Instructions (Addendum)
Start Coreg 25 mg twice a day   Start Albuterol Inhaler as needed  Lab work today  ( cmet )  Call for follow up when back in town

## 2013-06-28 NOTE — Addendum Note (Signed)
Addended by: Tonita Phoenix on: 06/28/2013 10:23 AM   Modules accepted: Orders

## 2013-06-28 NOTE — Progress Notes (Signed)
Patient ID: Martin Cain, male   DOB: [birth year], 67 y.o.   MRN: [id number]    HPI; 67 year old male who was visiting from [place] and was admitted with NSTEMI  and CHF twice in the lats months, that was believed to be secondary to malignant hypertension and acute on chronic renal failure. He was discharged on BP meds that he didn't pick up and was not using.  He was supposed to go back to [place] and have a stress test done there, however he is still in [place] and coming today for medications refill. He denies chest pain but complains of pain and swelling in his right knee.  This is a post hospitalization visit, he feels significantly better, still residual mild LE edema.  Filed Vitals:   06/28/13 0926  BP: 162/76  Pulse: 99  Height: 5\' 9"  (1.753 m)  Weight: 124 lb (56.246 kg)    Intake/Output Summary (Last 24 hours) at 05/31/13 0865 Last data filed at 05/30/13 2300  Gross per 24 hour  Intake    720 ml  Output    550 ml  Net    170 ml    SUBJECTIVE Patient mentally much clearer today. Denies chest pain or SOB. Biggest complaint is of leg pain- this is improved with oxycontin.  LABS: Basic Metabolic Panel: No results found for this basename: NA, K, CL, CO2, GLUCOSE, BUN, CREATININE, CALCIUM, MG, PHOS,  in the last 72 hours  BNP: 6983  Radiology/Studies:  Dg Chest 2 View  05/27/2013   CLINICAL DATA:  Hypertension, peripheral swelling  EXAM: CHEST  2 VIEW  COMPARISON:  None  FINDINGS: Cardiac shadow is mildly enlarged. Postsurgical changes are seen. The lungs are well aerated bilaterally without focal infiltrate or sizable effusion. No acute bony abnormality is noted.  IMPRESSION: No acute abnormality noted.   Electronically Signed   By: Alcide Clever M.D.   On: 05/27/2013 10:10   MRI HEAD WITHOUT CONTRAST  TECHNIQUE:  Multiplanar, multiecho pulse sequences of the brain and surrounding  structures were obtained without intravenous contrast.  COMPARISON: None.  FINDINGS:    Exam is motion degraded.  No acute infarct.  No intracranial hemorrhage.  Remote small right basal ganglia/thalamic infarct. Remote tiny right  paracentral pontine infarct. Remote tiny left cerebellar infarct.  Mild small vessel disease type changes.  Atrophy. Ventricular prominence felt to be related to atrophy rather  than hydrocephalus.  No intracranial mass lesion noted on this unenhanced exam.  Small right vertebral artery with superimposed atherosclerotic type  changes and significant narrowing. Left vertebral artery, basilar  artery and the internal carotid arteries as well as major dural  sinuses are patent.  Minimal spinal stenosis C3-4. Cervical medullary junction, pituitary  region, pineal region and orbital structures unremarkable.  Minimal paranasal sinus mucosal thickening.  IMPRESSION:  Exam is motion degraded.  No acute infarct.  Remote small right basal ganglia/thalamic infarct. Remote tiny right  paracentral pontine infarct. Remote tiny left cerebellar infarct.  Mild small vessel disease type changes.  Atrophy.  Small right vertebral artery with superimposed atherosclerotic type  changes and significant narrowing.  Electronically Signed  By: Bridgett Larsson M.D.  On: 05/30/2013 17:15   Ecg: NSR, LAD, LVH with repolarization abnormality.  PHYSICAL EXAM General: Thin, frail, alert Head: Normal. Neck: Negative for carotid bruits. JVD not elevated. Lungs: Clear bilaterally to auscultation without wheezes, rales, or rhonchi. Breathing is unlabored. Heart: RRR S1 S2 without murmurs, rubs, or gallops.  Abdomen: Soft, non-tender, non-distended  with normoactive bowel sounds. No hepatomegaly. Extremities: No clubbing, cyanosis, ankle edema B/L. Swelling and tenderness of he right knee.  Distal pedal pulses are 2+ and equal bilaterally. Neuro: alert oriented x 3.  Lexiscan nuclear stress test: 05/23/2013 IMPRESSION: 1. Matched attenuation involving the inferior wall of  the left ventricle without associated discrete wall motion abnormality. No definite scintigraphic evidence of prior infarction or pharmacologically induced ischemia. 2. Mild dilatation of the left ventricular cavity (end-diastolic volume - 191 ml). 3. Mild global hypokinesia. Ejection fraction - 42%.  Electronically Signed By: Simonne Come M.D. On: 06/22/2013 11:59   ASSESSMENT AND PLAN:  1. Combined systolic and diastolic CHF: Patient is feeling much better. Admitted to the hospital twice in the last month, significant improvement of symptoms after controlling his BP and starting lasix. Still some residual LE edema, we will continue lasix 20 mg po daily (CKD stage 3) and check CMP today.   2. CAD, h/o CABG in 2000, Elevated troponin chronically, on both hospital visits. This is most likely related to malignant hypertension and acute on chronic renal failure. No active chest pain. Negative Lexiscan nuclear stress test on 06/22/2013. Continue ASA, BB, ACEI and statin.  2. Malignant Hypertension. BP still uncontrolled today. He was advised to continue hydralazine 50 mg po TID, amlodipine 10 mg PO daily, lisinopril 40 mg po daily, lasix 20 mg po daily and was prescribed Carvedilol 25 mg po BID.   3. Acute on chronic renal failure. Creatinine is slightly higher. Urine output is good. ACEI restarted at the last hospital visit, we will chcek CMP today.  4. Gout right knee. On colchicine, complaining of significant pain, prescription for 7 days of Oxycodone was given.  5. Hyperlipidemia - restarted Crestor  6. Smoking - active - counseled  The patient is moving back and forth between [place] and [place]. He is to call for follow up when he returns from [place].  Virgel Manifold, Rexene Edison MD,FACC 06/28/2013 9:58 AM

## 2013-06-29 ENCOUNTER — Telehealth: Payer: Self-pay | Admitting: Cardiology

## 2013-06-29 NOTE — Telephone Encounter (Signed)
New Message  Ramiro Harvest with [facility] called states that the pt is a "Drinker" She states that when she arrived she saw that he had drank a half bottle of Gin and was drunk/// she states that he is on Hydrocodone// she simply wanted the office to be aware.

## 2015-04-17 ENCOUNTER — Ambulatory Visit (INDEPENDENT_AMBULATORY_CARE_PROVIDER_SITE_OTHER): Payer: Medicare HMO | Admitting: Cardiology

## 2015-04-17 ENCOUNTER — Encounter (HOSPITAL_COMMUNITY): Payer: Self-pay | Admitting: *Deleted

## 2015-04-17 ENCOUNTER — Inpatient Hospital Stay (HOSPITAL_COMMUNITY)
Admission: EM | Admit: 2015-04-17 | Discharge: 2015-04-25 | DRG: 811 | Disposition: A | Discharge status: Skilled Nursing Facility | Payer: Medicare HMO | Attending: Internal Medicine | Admitting: Internal Medicine

## 2015-04-17 ENCOUNTER — Telehealth: Payer: Self-pay | Admitting: Physician Assistant

## 2015-04-17 ENCOUNTER — Encounter: Payer: Self-pay | Admitting: Cardiology

## 2015-04-17 VITALS — BP 140/62 | HR 82 | Ht 69.0 in | Wt 148.6 lb

## 2015-04-17 DIAGNOSIS — N184 Chronic kidney disease, stage 4 (severe): Secondary | ICD-10-CM | POA: Diagnosis present

## 2015-04-17 DIAGNOSIS — Z951 Presence of aortocoronary bypass graft: Secondary | ICD-10-CM

## 2015-04-17 DIAGNOSIS — I2510 Atherosclerotic heart disease of native coronary artery without angina pectoris: Secondary | ICD-10-CM | POA: Diagnosis present

## 2015-04-17 DIAGNOSIS — Z8249 Family history of ischemic heart disease and other diseases of the circulatory system: Secondary | ICD-10-CM

## 2015-04-17 DIAGNOSIS — D508 Other iron deficiency anemias: Secondary | ICD-10-CM | POA: Diagnosis not present

## 2015-04-17 DIAGNOSIS — N289 Disorder of kidney and ureter, unspecified: Secondary | ICD-10-CM

## 2015-04-17 DIAGNOSIS — I10 Essential (primary) hypertension: Secondary | ICD-10-CM | POA: Diagnosis not present

## 2015-04-17 DIAGNOSIS — Z7982 Long term (current) use of aspirin: Secondary | ICD-10-CM

## 2015-04-17 DIAGNOSIS — F1010 Alcohol abuse, uncomplicated: Secondary | ICD-10-CM | POA: Diagnosis present

## 2015-04-17 DIAGNOSIS — M109 Gout, unspecified: Secondary | ICD-10-CM | POA: Diagnosis present

## 2015-04-17 DIAGNOSIS — R5381 Other malaise: Secondary | ICD-10-CM | POA: Diagnosis not present

## 2015-04-17 DIAGNOSIS — I509 Heart failure, unspecified: Secondary | ICD-10-CM | POA: Diagnosis not present

## 2015-04-17 DIAGNOSIS — Z7951 Long term (current) use of inhaled steroids: Secondary | ICD-10-CM

## 2015-04-17 DIAGNOSIS — D649 Anemia, unspecified: Secondary | ICD-10-CM | POA: Diagnosis not present

## 2015-04-17 DIAGNOSIS — Z809 Family history of malignant neoplasm, unspecified: Secondary | ICD-10-CM

## 2015-04-17 DIAGNOSIS — Z9114 Patient's other noncompliance with medication regimen: Secondary | ICD-10-CM

## 2015-04-17 DIAGNOSIS — I130 Hypertensive heart and chronic kidney disease with heart failure and stage 1 through stage 4 chronic kidney disease, or unspecified chronic kidney disease: Secondary | ICD-10-CM | POA: Diagnosis present

## 2015-04-17 DIAGNOSIS — R1310 Dysphagia, unspecified: Secondary | ICD-10-CM | POA: Diagnosis present

## 2015-04-17 DIAGNOSIS — I5043 Acute on chronic combined systolic (congestive) and diastolic (congestive) heart failure: Secondary | ICD-10-CM | POA: Diagnosis not present

## 2015-04-17 DIAGNOSIS — G231 Progressive supranuclear ophthalmoplegia [Steele-Richardson-Olszewski]: Secondary | ICD-10-CM | POA: Diagnosis present

## 2015-04-17 DIAGNOSIS — Z6820 Body mass index (BMI) 20.0-20.9, adult: Secondary | ICD-10-CM

## 2015-04-17 DIAGNOSIS — Z66 Do not resuscitate: Secondary | ICD-10-CM | POA: Diagnosis not present

## 2015-04-17 DIAGNOSIS — E46 Unspecified protein-calorie malnutrition: Secondary | ICD-10-CM | POA: Diagnosis present

## 2015-04-17 DIAGNOSIS — N185 Chronic kidney disease, stage 5: Secondary | ICD-10-CM

## 2015-04-17 DIAGNOSIS — Z79899 Other long term (current) drug therapy: Secondary | ICD-10-CM

## 2015-04-17 DIAGNOSIS — Z515 Encounter for palliative care: Secondary | ICD-10-CM | POA: Diagnosis not present

## 2015-04-17 DIAGNOSIS — I252 Old myocardial infarction: Secondary | ICD-10-CM

## 2015-04-17 DIAGNOSIS — E785 Hyperlipidemia, unspecified: Secondary | ICD-10-CM | POA: Diagnosis present

## 2015-04-17 DIAGNOSIS — F17210 Nicotine dependence, cigarettes, uncomplicated: Secondary | ICD-10-CM | POA: Diagnosis present

## 2015-04-17 LAB — COMPREHENSIVE METABOLIC PANEL
ALT: 22 U/L (ref 9–46)
ALT: 22 U/L (ref 17–63)
ANION GAP: 12 (ref 5–15)
AST: 20 U/L (ref 10–35)
AST: 20 U/L (ref 15–41)
Albumin: 3.5 g/dL — ABNORMAL LOW (ref 3.6–5.1)
Albumin: 3.3 g/dL — ABNORMAL LOW (ref 3.5–5.0)
Alkaline Phosphatase: 65 U/L (ref 40–115)
Alkaline Phosphatase: 70 U/L (ref 38–126)
BUN: 48 mg/dL — ABNORMAL HIGH (ref 7–25)
BUN: 49 mg/dL — ABNORMAL HIGH (ref 6–20)
CHLORIDE: 106 mmol/L (ref 101–111)
CO2: 24 mmol/L (ref 20–31)
CO2: 26 mmol/L (ref 22–32)
Calcium: 8.7 mg/dL (ref 8.6–10.3)
Calcium: 8.8 mg/dL — ABNORMAL LOW (ref 8.9–10.3)
Chloride: 111 mmol/L — ABNORMAL HIGH (ref 98–110)
Creat: 2.52 mg/dL — ABNORMAL HIGH (ref 0.70–1.18)
Creatinine, Ser: 2.77 mg/dL — ABNORMAL HIGH (ref 0.61–1.24)
GFR calc non Af Amer: 22 mL/min — ABNORMAL LOW (ref >60)
GFR, EST AFRICAN AMERICAN: 25 mL/min — AB (ref >60)
Glucose, Bld: 88 mg/dL (ref 65–99)
Glucose, Bld: 113 mg/dL — ABNORMAL HIGH (ref 65–99)
Potassium: 4.6 mmol/L (ref 3.5–5.3)
Potassium: 4.2 mmol/L (ref 3.5–5.1)
SODIUM: 144 mmol/L (ref 135–145)
Sodium: 144 mmol/L (ref 135–146)
Total Bilirubin: 0.3 mg/dL (ref 0.2–1.2)
Total Bilirubin: 0.3 mg/dL (ref 0.3–1.2)
Total Protein: 6.4 g/dL (ref 6.1–8.1)
Total Protein: 6.6 g/dL (ref 6.5–8.1)

## 2015-04-17 LAB — CBC WITH DIFFERENTIAL/PLATELET
Basophils Absolute: 0 10*3/uL (ref 0.0–0.1)
Basophils Absolute: 0 10*3/uL (ref 0.0–0.1)
Basophils Relative: 0 % (ref 0–1)
Basophils Relative: 0 %
EOS ABS: 0 10*3/uL (ref 0.0–0.7)
EOS PCT: 1 %
Eosinophils Absolute: 0.1 10*3/uL (ref 0.0–0.7)
Eosinophils Relative: 1 % (ref 0–5)
HCT: 22 % — ABNORMAL LOW (ref 39.0–52.0)
HCT: 22 % — ABNORMAL LOW (ref 39.0–52.0)
Hemoglobin: 6.6 g/dL — CL (ref 13.0–17.0)
Hemoglobin: 6.7 g/dL — CL (ref 13.0–17.0)
LYMPHS ABS: 1.7 10*3/uL (ref 0.7–4.0)
Lymphocytes Relative: 32 % (ref 12–46)
Lymphocytes Relative: 30 %
Lymphs Abs: 1.6 10*3/uL (ref 0.7–4.0)
MCH: 25.5 pg — ABNORMAL LOW (ref 26.0–34.0)
MCH: 25.4 pg — AB (ref 26.0–34.0)
MCHC: 30 g/dL (ref 30.0–36.0)
MCHC: 30.5 g/dL (ref 30.0–36.0)
MCV: 84.9 fL (ref 78.0–100.0)
MCV: 83.3 fL (ref 78.0–100.0)
MONO ABS: 0.5 10*3/uL (ref 0.1–1.0)
MONOS PCT: 10 %
MPV: 8.2 fL — ABNORMAL LOW (ref 8.6–12.4)
Monocytes Absolute: 0.4 10*3/uL (ref 0.1–1.0)
Monocytes Relative: 7 % (ref 3–12)
Neutro Abs: 3.1 10*3/uL (ref 1.7–7.7)
Neutro Abs: 3.3 10*3/uL (ref 1.7–7.7)
Neutrophils Relative %: 60 % (ref 43–77)
Neutrophils Relative %: 59 %
PLATELETS: 325 10*3/uL (ref 150–400)
Platelets: 325 10*3/uL (ref 150–400)
RBC: 2.59 MIL/uL — ABNORMAL LOW (ref 4.22–5.81)
RBC: 2.64 MIL/uL — AB (ref 4.22–5.81)
RDW: 19.3 % — ABNORMAL HIGH (ref 11.5–15.5)
RDW: 19 % — AB (ref 11.5–15.5)
WBC: 5.1 10*3/uL (ref 4.0–10.5)
WBC: 5.5 10*3/uL (ref 4.0–10.5)

## 2015-04-17 LAB — I-STAT TROPONIN, ED: Troponin i, poc: 0.08 ng/mL (ref 0.00–0.08)

## 2015-04-17 LAB — PROTIME-INR
INR: 1.26 (ref 0.00–1.49)
PROTHROMBIN TIME: 15.9 s — AB (ref 11.6–15.2)

## 2015-04-17 LAB — POC OCCULT BLOOD, ED: Fecal Occult Bld: NEGATIVE

## 2015-04-17 LAB — BRAIN NATRIURETIC PEPTIDE: B NATRIURETIC PEPTIDE 5: 2795.6 pg/mL — AB (ref 0.0–100.0)

## 2015-04-17 LAB — PREPARE RBC (CROSSMATCH)

## 2015-04-17 MED ORDER — ADULT MULTIVITAMIN W/MINERALS CH
1.0000 | ORAL_TABLET | Freq: Every day | ORAL | Status: DC
  Administered 2015-04-20 – 2015-04-25 (×5): 1 via ORAL
  Filled 2015-04-17 (×5): qty 1

## 2015-04-17 MED ORDER — FOLIC ACID 1 MG PO TABS
1.0000 mg | ORAL_TABLET | Freq: Every day | ORAL | Status: DC
  Administered 2015-04-20 – 2015-04-25 (×5): 1 mg via ORAL
  Filled 2015-04-17 (×6): qty 1

## 2015-04-17 MED ORDER — ALBUTEROL SULFATE HFA 108 (90 BASE) MCG/ACT IN AERS: 2.0000 | INHALATION_SPRAY | Freq: Four times a day (QID) | RESPIRATORY_TRACT | Status: DC | PRN

## 2015-04-17 MED ORDER — CARVEDILOL 25 MG PO TABS
25.0000 mg | ORAL_TABLET | Freq: Two times a day (BID) | ORAL | Status: DC
  Administered 2015-04-18 – 2015-04-25 (×11): 25 mg via ORAL
  Filled 2015-04-17 (×9): qty 1
  Filled 2015-04-17: qty 2
  Filled 2015-04-17: qty 1
  Filled 2015-04-17: qty 2

## 2015-04-17 MED ORDER — HYDRALAZINE HCL 100 MG PO TABS: 100.0000 mg | ORAL_TABLET | ORAL | Status: DC

## 2015-04-17 MED ORDER — PANTOPRAZOLE SODIUM 40 MG PO TBEC: 80.0000 mg | DELAYED_RELEASE_TABLET | Freq: Every day | ORAL | Status: DC

## 2015-04-17 MED ORDER — ISOSORBIDE DINITRATE ER 40 MG PO TBCR
40.0000 mg | EXTENDED_RELEASE_TABLET | Freq: Every day | ORAL | Status: DC
  Administered 2015-04-18: 40 mg via ORAL
  Filled 2015-04-17 (×5): qty 1

## 2015-04-17 MED ORDER — ATORVASTATIN CALCIUM 80 MG PO TABS
80.0000 mg | ORAL_TABLET | Freq: Every day | ORAL | Status: DC
  Administered 2015-04-20 – 2015-04-24 (×4): 80 mg via ORAL
  Filled 2015-04-17 (×5): qty 1

## 2015-04-17 MED ORDER — ALLOPURINOL 100 MG PO TABS
100.0000 mg | ORAL_TABLET | Freq: Every day | ORAL | Status: DC
  Administered 2015-04-18 – 2015-04-25 (×5): 100 mg via ORAL
  Filled 2015-04-17 (×6): qty 1

## 2015-04-17 MED ORDER — MOMETASONE FURO-FORMOTEROL FUM 100-5 MCG/ACT IN AERO
2.0000 | INHALATION_SPRAY | Freq: Two times a day (BID) | RESPIRATORY_TRACT | Status: DC
  Administered 2015-04-18 – 2015-04-25 (×13): 2 via RESPIRATORY_TRACT
  Filled 2015-04-17: qty 8.8

## 2015-04-17 MED ORDER — AMLODIPINE BESYLATE 10 MG PO TABS
10.0000 mg | ORAL_TABLET | Freq: Every day | ORAL | Status: DC
  Administered 2015-04-18 – 2015-04-25 (×6): 10 mg via ORAL
  Filled 2015-04-17 (×6): qty 1

## 2015-04-17 MED ORDER — FERROUS SULFATE 325 (65 FE) MG PO TABS: 325.0000 mg | ORAL_TABLET | ORAL | Status: DC

## 2015-04-17 MED ORDER — THIAMINE HCL 100 MG/ML IJ SOLN
100.0000 mg | Freq: Every day | INTRAMUSCULAR | Status: DC
  Administered 2015-04-25: 100 mg via INTRAVENOUS
  Filled 2015-04-17 (×2): qty 2

## 2015-04-17 MED ORDER — VITAMIN B-1 100 MG PO TABS
100.0000 mg | ORAL_TABLET | Freq: Every day | ORAL | Status: DC
  Administered 2015-04-18 – 2015-04-24 (×5): 100 mg via ORAL
  Filled 2015-04-17 (×6): qty 1

## 2015-04-17 MED ORDER — BECLOMETHASONE DIPROPIONATE 80 MCG/ACT IN AERS: 2.0000 | INHALATION_SPRAY | Freq: Two times a day (BID) | RESPIRATORY_TRACT | Status: DC

## 2015-04-17 MED ORDER — RISPERIDONE 1 MG PO TBDP: 1.0000 mg | ORAL_TABLET | Freq: Every evening | ORAL | Status: DC

## 2015-04-17 MED ORDER — SODIUM CHLORIDE 0.9 % IV SOLN
10.0000 mL/h | Freq: Once | INTRAVENOUS | Status: AC
  Administered 2015-04-18: 10 mL/h via INTRAVENOUS

## 2015-04-17 MED ORDER — LORAZEPAM 2 MG/ML IJ SOLN: 1.0000 mg | Freq: Four times a day (QID) | INTRAMUSCULAR | Status: AC | PRN

## 2015-04-17 MED ORDER — PANTOPRAZOLE SODIUM 40 MG PO TBEC
40.0000 mg | DELAYED_RELEASE_TABLET | Freq: Every day | ORAL | Status: DC
  Administered 2015-04-18 – 2015-04-25 (×5): 40 mg via ORAL
  Filled 2015-04-17 (×5): qty 1

## 2015-04-17 MED ORDER — FUROSEMIDE 40 MG PO TABS: 40.0000 mg | ORAL_TABLET | Freq: Every day | ORAL | Status: DC

## 2015-04-17 MED ORDER — IPRATROPIUM-ALBUTEROL 0.5-2.5 (3) MG/3ML IN SOLN: 3.0000 mL | Freq: Four times a day (QID) | RESPIRATORY_TRACT | Status: DC | PRN

## 2015-04-17 MED ORDER — SODIUM CHLORIDE 0.9 % IJ SOLN
3.0000 mL | Freq: Two times a day (BID) | INTRAMUSCULAR | Status: DC
  Administered 2015-04-18 – 2015-04-25 (×15): 3 mL via INTRAVENOUS

## 2015-04-17 MED ORDER — FUROSEMIDE 40 MG PO TABS
40.0000 mg | ORAL_TABLET | Freq: Every day | ORAL | Status: DC
  Administered 2015-04-20 – 2015-04-21 (×2): 40 mg via ORAL
  Filled 2015-04-17 (×3): qty 1

## 2015-04-17 MED ORDER — LORAZEPAM 1 MG PO TABS: 1.0000 mg | ORAL_TABLET | Freq: Four times a day (QID) | ORAL | Status: AC | PRN

## 2015-04-17 NOTE — Significant Event (Signed)
Hospital records from [facility] are available in Care Everywhere tab and have been reviewed: Updates as follows:  1) It does appear that his kidney function today is baseline (creatinine was running 2.2-2.5 all of last month). 2) It does not appear that he is taking respiradal [facility], have discontinued this as a result 3) Upper EGD biopsy done on 03/30/15 of the "atypical mucosa" has come back suggesting that this is only a benign Brunner's gland nodule.

## 2015-04-17 NOTE — Progress Notes (Signed)
Patient ID: Martin Cain, male   DOB: 12/20/1945, 69 y.o.   MRN: [id number]    HPI: 69 year old male with h/o chronic combined systolic and diastolic CHF, CAD, s/p CABG in 2000, CKD stage 3, etoh abuse, medication non-compliance, HTN, COPD,  who was visiting from [place] and was admitted with NSTEMI  and CHF twice in the 2015, that was believed to be secondary to malignant hypertension and acute on chronic renal failure. He was discharged on BP meds that he didn't pick up and was not using. Afterwards he was non-complaint with his meds. His Lexiscan nuclear stress test in 05/2013 was negative for infarct or ischemia, LVEF was 42% with diffuse hypokinesis.  Today, 2 years later he presents with his daughter that states that he has moved back with her to [place]. He abuses alcohol. He was recently hospitalized with CHF in [place]. He was also found to be anemic with Hb 5.7, received transfusions, no source identified. Anemia is normocytic. He has EGD on 03/30/15 that was non-revealing, a colonoscopy about a year ago, and was scheduled for an outpatient capsule endoscopy, however moved back to [place]. He denies chest pain, but has exertional dyspnea, sleep upright on a sofa, has LE edema, denies PND, palpitations or syncope. He appears disheveled.  Filed Vitals:   04/17/15 0823  BP: 140/62  Pulse: 82  Height: 5\' 9"  (1.753 m)  Weight: 148 lb 9.6 oz (67.405 kg)    Intake/Output Summary (Last 24 hours) at 05/31/13 [id number] Last data filed at 05/30/13 2300  Gross per 24 hour  Intake    720 ml  Output    550 ml  Net    170 ml    SUBJECTIVE Patient mentally much clearer today. Denies chest pain or SOB. Biggest complaint is of leg pain- this is improved with oxycontin.  LABS: Basic Metabolic Panel: No results for input(s): NA, K, CL, CO2, GLUCOSE, BUN, CREATININE, CALCIUM, MG, PHOS in the last 72 hours.  BNP: 6983  Radiology/Studies:  Dg Chest 2 View  05/27/2013   CLINICAL DATA:  Hypertension,  peripheral swelling  EXAM: CHEST  2 VIEW  COMPARISON:  None  FINDINGS: Cardiac shadow is mildly enlarged. Postsurgical changes are seen. The lungs are well aerated bilaterally without focal infiltrate or sizable effusion. No acute bony abnormality is noted.  IMPRESSION: No acute abnormality noted.   Electronically Signed   By: Alcide CleverMark  Lukens M.D.   On: 05/27/2013 10:10   MRI HEAD WITHOUT CONTRAST  TECHNIQUE:  Multiplanar, multiecho pulse sequences of the brain and surrounding  structures were obtained without intravenous contrast.  COMPARISON: None.  FINDINGS:  Exam is motion degraded.  No acute infarct.  No intracranial hemorrhage.  Remote small right basal ganglia/thalamic infarct. Remote tiny right  paracentral pontine infarct. Remote tiny left cerebellar infarct.  Mild small vessel disease type changes.  Atrophy. Ventricular prominence felt to be related to atrophy rather  than hydrocephalus.  No intracranial mass lesion noted on this unenhanced exam.  Small right vertebral artery with superimposed atherosclerotic type  changes and significant narrowing. Left vertebral artery, basilar  artery and the internal carotid arteries as well as major dural  sinuses are patent.  Minimal spinal stenosis C3-4. Cervical medullary junction, pituitary  region, pineal region and orbital structures unremarkable.  Minimal paranasal sinus mucosal thickening.  IMPRESSION:  Exam is motion degraded.  No acute infarct.  Remote small right basal ganglia/thalamic infarct. Remote tiny right  paracentral pontine infarct. Remote tiny left cerebellar  infarct.  Mild small vessel disease type changes.  Atrophy.  Small right vertebral artery with superimposed atherosclerotic type  changes and significant narrowing.  Electronically Signed  By: Bridgett Larsson M.D.  On: 05/30/2013 17:15   Ecg: NSR, LAD, LVH with repolarization abnormality.  PHYSICAL EXAM General: Thin, frail, alert Head: Normal. Neck:  Negative for carotid bruits. JVD not elevated. Lungs: Clear bilaterally to auscultation without wheezes, rales, or rhonchi. Breathing is unlabored. Heart: RRR S1 S2 without murmurs, rubs, or gallops.  Abdomen: Soft, non-tender, non-distended with normoactive bowel sounds. No hepatomegaly. Extremities: No clubbing, cyanosis, ankle edema B/L. Swelling and tenderness of he right knee.  Distal pedal pulses are 2+ and equal bilaterally. Neuro: alert oriented x 3.  Lexiscan nuclear stress test: 05/23/2013 IMPRESSION: 1. Matched attenuation involving the inferior wall of the left ventricle without associated discrete wall motion abnormality. No definite scintigraphic evidence of prior infarction or pharmacologically induced ischemia. 2. Mild dilatation of the left ventricular cavity (end-diastolic volume - 191 ml). 3. Mild global hypokinesia. Ejection fraction - 42%.  Electronically Signed By: Simonne Come M.D. On: 06/22/2013 11:59   ASSESSMENT AND PLAN:  1. Acute on chronic combined systolic and diastolic CHF: the patient recently hospitalized in [place], he is fluid overloaded, 24 lbs heavier than 2 years ago. Increase lasix to 40 mg po daily.  Labs - CMP today. Obtain TTE as there is none in Epic.  2. CAD, h/o CABG in 2000, Elevated troponin chronically, on both hospital visits. ECG shows new changes in the inferior leads suspicious for ischemia, however he denies chest pain and currently is CHD stage 3.  Continue ASA, BB, ACEI and statin.  3. Anemia - normocytic - normal EGD on 03/30/15, normal colonoscopy a year ago, planned outpatient capsule endoscopy, he needs to find a GI doctor. We will check CBC today.  4. Malignant Hypertension. Borderline today - increase lasix to 40 mg po daily  5. Hyperlipidemia - on Crestor  6. Smoking - active - counseled  Follow up in 4 weeks.  Rico Sheehan MD,FACC 04/17/2015 8:34 AM

## 2015-04-17 NOTE — ED Notes (Signed)
Critical lab given to Dr. Verdie MosherLiu

## 2015-04-17 NOTE — ED Notes (Signed)
Attempted to call report

## 2015-04-17 NOTE — Telephone Encounter (Signed)
Received stat lab report that patient's hemoglobin was 6.6. This is a marked change from 2014 - last labs at that time 9-12 range. Given comorbidities it is recommended the patient proceed to the ER for further evaluation. I called patient at home and got permission from him to speak with his daughter at his request. She is living with him here in [place]. She verbalized understanding of recommendation. Sunjai Levandoski PA-C

## 2015-04-17 NOTE — Telephone Encounter (Signed)
Thank you Dayna. 

## 2015-04-17 NOTE — ED Provider Notes (Signed)
CSN: [id number]     Arrival date & time 04/17/15  2152 History   First MD Initiated Contact with Patient 04/17/15 2218     Chief Complaint  Patient presents with  . Anemia     (Consider location/radiation/quality/duration/timing/severity/associated sxs/prior Treatment) HPI 69 year old male who presents with anemia. History of CAD status post CABG, stage III chronic kidney disease, chronic systolic heart failure with an EF of 45-50%, hypertension, and hyperlipidemia. Reports he was recently admitted into [facility] in [place] for workup of anemia. He was found to have anemia with a hemoglobin between 5 and 6 on routine blood work there. Had a prolonged hospitalization, and it was unclear of the etiology of his anemia. He has not had melena or hematochezia, and reports having upper and lower endoscopy without clear source. Was discharged home, and returned to [facility] with routine follow-up with his cardiologist today. He had routine blood work done that showed persistent anemia and sent to the ED with a hemoglobin of 6.7. Reports that when he left the hospital at [facility] his hemoglobin was around 8. Denies any new melena/hematochezia. Has not had any vomiting, abdominal pain, weight loss, night sweats, chest pain or difficulty breathing. States that he has been feeling more weak especially doing daily activities. Has not had lightheadedness or syncope. Does not take blood thinners aside from baby aspirin.  Past Medical History  Diagnosis Date  . Hypertension   . Hyperlipidemia   . CKD (chronic kidney disease)     a. Probable stage III.  Marland Kitchen CAD (coronary artery disease)     a. CABG 2000 in [place]. b. NSTEMI 04/2013 felt due to malignant hypertension (was instructed to f/u [facility] for stress test).  . Gout   . CHF (congestive heart failure) (HCC)     a. Echo 04/2013: EF 45-50%, mild AS, mild biatrial enlargement, sev LVH.  . Osteoarthritis     R knee  . Anemia    Past Surgical History   Procedure Laterality Date  . Cardiac surgery      2000; [place]  . Appendectomy    . Abdominal exploration surgery    . Tonsillectomy     Family History  Problem Relation Age of Onset  . Heart disease      No family history  . Cancer Father   . Hypertension Father    Social History  Substance Use Topics  . Smoking status: Current Every Day Smoker -- 0.50 packs/day for 40 years    Types: Cigarettes  . Smokeless tobacco: Never Used  . Alcohol Use: Yes     Comment: previously admitted to 1 pint per day. denies use for 1 month.    Review of Systems 10/14 systems reviewed and are negative other than those stated in the HPI    Allergies  Review of patient's allergies indicates no known allergies.  Home Medications   Prior to Admission medications   Medication Sig Start Date End Date Taking? Authorizing Provider  albuterol (PROVENTIL HFA;VENTOLIN HFA) 108 (90 BASE) MCG/ACT inhaler Inhale 2 puffs into the lungs every 6 (six) hours as needed for wheezing or shortness of breath. 06/28/13  Yes Lars Masson, MD  allopurinol (ZYLOPRIM) 100 MG tablet Take 100 mg by mouth daily. 04/11/15  Yes Historical Provider, MD  amLODipine (NORVASC) 10 MG tablet Take 1 tablet (10 mg total) by mouth daily. 06/22/13  Yes Ripudeep Jenna Luo, MD  atorvastatin (LIPITOR) 80 MG tablet Take 80 mg by mouth daily. 04/11/15  Yes  Historical Provider, MD  carvedilol (COREG) 25 MG tablet Take 1 tablet (25 mg total) by mouth 2 (two) times daily. 06/28/13  Yes Lars MassonKatarina H Nelson, MD  Cholecalciferol (VITAMIN D PO) Take 1,000 mg by mouth daily.   Yes Historical Provider, MD  colchicine 0.6 MG tablet Take 0.5 tablets (0.3 mg total) by mouth daily. 05/31/13  Yes Adeline Joselyn Glassman Viyuoh, MD  [employer] ASPIRIN LOW DOSE 81 MG EC tablet Take 81 mg by mouth daily. 04/11/15  Yes Historical Provider, MD  Cyanocobalamin (VITAMIN B-12 PO) Take 1,000 mg by mouth daily.   Yes Historical Provider, MD  ferrous sulfate 325 (65 FE) MG tablet  Take 325 mg by mouth every morning.  04/11/15  Yes Historical Provider, MD  Fluticasone-Salmeterol (ADVAIR) 250-50 MCG/DOSE AEPB Inhale 1 puff into the lungs 2 (two) times daily. 06/09/13  Yes Lars MassonKatarina H Nelson, MD  folic acid (FOLVITE) 1 MG tablet Take 1 mg by mouth daily. 04/11/15  Yes Historical Provider, MD  furosemide (LASIX) 40 MG tablet Take 1 tablet (40 mg total) by mouth daily. 04/17/15  Yes Lars MassonKatarina H Nelson, MD  hydrALAZINE (APRESOLINE) 100 MG tablet Take 100 mg by mouth every morning.  04/11/15  Yes Historical Provider, MD  ipratropium-albuterol (DUONEB) 0.5-2.5 (3) MG/3ML SOLN Inhale 3 mLs into the lungs every 6 (six) hours as needed (shortness of breath).  04/11/15  Yes Historical Provider, MD  isosorbide dinitrate (ISOCHRON) 40 MG CR tablet Take 40 mg by mouth daily. 03/01/15  Yes Historical Provider, MD  lisinopril (PRINIVIL,ZESTRIL) 5 MG tablet Take 5 mg by mouth daily. 04/11/15  Yes Historical Provider, MD  nitroGLYCERIN (NITROSTAT) 0.4 MG SL tablet Place 0.4 mg under the tongue every 5 (five) minutes as needed for chest pain.   Yes Historical Provider, MD  omeprazole (PRILOSEC) 40 MG capsule Take 1 capsule (40 mg total) by mouth daily. 05/31/13  Yes Adeline C Viyuoh, MD  pantoprazole (PROTONIX) 40 MG tablet Take 1 tablet (40 mg total) by mouth daily. 06/22/13  Yes Ripudeep Jenna LuoK Rai, MD  QVAR 80 MCG/ACT inhaler Inhale 2 puffs into the lungs 2 (two) times daily. 04/11/15  Yes Historical Provider, MD  risperiDONE (RISPERDAL M-TABS) 1 MG disintegrating tablet Take 1 tablet (1 mg total) by mouth every evening. 05/31/13  Yes Adeline Joselyn Glassman Viyuoh, MD  vitamin C (ASCORBIC ACID) 250 MG tablet Take 250 mg by mouth daily. 04/11/15  Yes Historical Provider, MD  VOLTAREN 1 % GEL Apply 1 application topically 4 (four) times daily as needed (pain). Knee pain 02/28/15  Yes Historical Provider, MD   BP 136/65 mmHg  Pulse 83  Temp(Src) 97.4 F (36.3 C) (Oral)  Resp 19  Wt 149 lb (67.586 kg)  SpO2  98% Physical Exam Physical Exam  Nursing note and vitals reviewed. Constitutional: Chronically ill appearing, non-toxic, and in no acute distress Head: Normocephalic and atraumatic.  Mouth/Throat: Oropharynx is clear and moist.  Neck: Normal range of motion. Neck supple.  Cardiovascular: Normal rate and regular rhythm.  Bilateral lower extremity edema. Pulmonary/Chest: Effort normal and breath sounds normal.  Abdominal: Soft. There is no tenderness. There is no rebound and no guarding.  Musculoskeletal: Normal range of motion.  Neurological: Alert, no facial droop, fluent speech, moves all extremities symmetrically Skin: Skin is warm and dry.  Psychiatric: Cooperative  ED Course  Procedures (including critical care time) Labs Review Labs Reviewed  CBC WITH DIFFERENTIAL/PLATELET - Abnormal; Notable for the following:    RBC 2.64 (*)    Hemoglobin  6.7 (*)    HCT 22.0 (*)    MCH 25.4 (*)    RDW 19.0 (*)    All other components within normal limits  COMPREHENSIVE METABOLIC PANEL - Abnormal; Notable for the following:    Glucose, Bld 113 (*)    BUN 49 (*)    Creatinine, Ser 2.77 (*)    Calcium 8.8 (*)    Albumin 3.3 (*)    GFR calc non Af Amer 22 (*)    GFR calc Af Amer 25 (*)    All other components within normal limits  PROTIME-INR - Abnormal; Notable for the following:    Prothrombin Time 15.9 (*)    All other components within normal limits  BRAIN NATRIURETIC PEPTIDE - Abnormal; Notable for the following:    B Natriuretic Peptide 2795.6 (*)    All other components within normal limits  APTT  OCCULT BLOOD X 1 CARD TO LAB, STOOL  CBC  BASIC METABOLIC PANEL  I-STAT TROPOININ, ED  POC OCCULT BLOOD, ED  TYPE AND SCREEN  PREPARE RBC (CROSSMATCH)  ABO/RH    Imaging Review No results found. I have personally reviewed and evaluated these images and lab results as part of my medical decision-making.   EKG Interpretation   Date/Time:  Tuesday April 17 2015 22:02:03  EDT Ventricular Rate:  81 PR Interval:  154 QRS Duration: 90 QT Interval:  402 QTC Calculation: 466 R Axis:   -72 Text Interpretation:  Normal sinus rhythm Left axis deviation Septal  infarct , age undetermined Inferior infarct , age undetermined ST \\T \ T  wave abnormality, consider lateral ischemia Abnormal ECG No significant  change since last tracing Confirmed by Chivas Notz MD, Babette Stum [phone]) on 04/17/2015  10:10:56 PM       CRITICAL CARE Performed by: Lavera Guise   Total critical care time: 30 min  Critical care time was exclusive of separately billable procedures and treating other patients.  Critical care was necessary to treat or prevent imminent or life-threatening deterioration.  Critical care was time spent personally by me on the following activities: development of treatment plan with patient and/or surrogate as well as nursing, discussions with consultants, evaluation of patient's response to treatment, examination of patient, obtaining history from patient or surrogate, ordering and performing treatments and interventions, ordering and review of laboratory studies, ordering and review of radiographic studies, pulse oximetry and re-evaluation of patient's condition.   MDM   Final diagnoses:  Anemia, unspecified anemia type    69 year old male with history of CAD status post CABG, stage III CK D, chronic systolic heart failure, hypertension hyperlipidemia who presents with anemia of unclear etiology. Is chronically ill-appearing, but currently nontoxic and in no acute distress. Hemodynamically is stable. No evidence of acute bleeding on exam. Hemoglobin is noted to be 6.7, and he is ordered 1 unit of packed red blood cells for transfusion. No evidence of acute cocaine coagulopathy. Rectal exam revealing no evidence of bleeding. EKG is unchanged from prior, and troponin is negative. No evidence of heart strain. I discussed with [facility] hospitalist who will admit for ongoing  management and further workup of anemia as needed.    Lavera Guise, MD 04/18/15 0000

## 2015-04-17 NOTE — ED Notes (Signed)
Pt coming from home with c/o anemia. Pt had [facility] drawn today at [facility] for follow [facility]. Pt had hemoglobin of 6.6. Pt denies chest pain, shortness of breath. Pt has hx of blood transfusion two weeks ago.

## 2015-04-17 NOTE — H&P (Addendum)
[facility] History and Physical  Martin Cain:[id number] DOB: 10/29/1945 DOA: 04/17/2015  Referring physician: EDP PCP: No primary care provider on file.   Chief Complaint: Anemia   HPI: Martin Cain is a 69 y.o. male with h/o CHF (EF 42% in 2014), CAD s/p CABG in 2000, CKD stage 3, ongoing EtOH abuse, medication non-compliance.  He has recently been hospitalized in [place] at [facility] for CHF and also anemia with HGB of 5.7.  Obtaining the formal records is pending but it is known that during that admit he had a normal EGD on 03/30/15, normal colonoscopy a year ago.  He was transfused PRBC units and discharged with plan for outpatient capsule endoscopy.  Before this could be completed however, he moved to [facility].  Today he was seen at the cardiologists office here in town for follow up.  Labs drawn at the cardiologists office came back showing HGB of 6.6.  Patient was sent into the ED.  Review of Systems: Systems reviewed.  As above, otherwise negative  Past Medical History  Diagnosis Date  . Hypertension   . Hyperlipidemia   . CKD (chronic kidney disease)     a. Probable stage III.  Marland Kitchen CAD (coronary artery disease)     a. CABG 2000 in [place]. b. NSTEMI 04/2013 felt due to malignant hypertension (was instructed to f/u ATL for stress test).  . Gout   . CHF (congestive heart failure) (HCC)     a. Echo 04/2013: EF 45-50%, mild AS, mild biatrial enlargement, sev LVH.  . Osteoarthritis     R knee  . Anemia    Past Surgical History  Procedure Laterality Date  . Cardiac surgery      2000; [place]  . Appendectomy    . Abdominal exploration surgery    . Tonsillectomy     Social History:  reports that he has been smoking Cigarettes.  He has a 20 pack-year smoking history. He has never used smokeless tobacco. He reports that he drinks alcohol. He reports that he does not use illicit drugs.  No Known Allergies  Family History  Problem Relation Age of Onset  . Heart  disease      No family history  . Cancer Father   . Hypertension Father      Prior to Admission medications   Medication Sig Start Date End Date Taking? Authorizing Provider  albuterol (PROVENTIL HFA;VENTOLIN HFA) 108 (90 BASE) MCG/ACT inhaler Inhale 2 puffs into the lungs every 6 (six) hours as needed for wheezing or shortness of breath. 06/28/13   Lars Masson, MD  allopurinol (ZYLOPRIM) 100 MG tablet Take 100 mg by mouth daily. 04/11/15   Historical Provider, MD  amLODipine (NORVASC) 10 MG tablet Take 1 tablet (10 mg total) by mouth daily. 06/22/13   Ripudeep Jenna Luo, MD  atorvastatin (LIPITOR) 80 MG tablet Take 80 mg by mouth daily. 04/11/15   Historical Provider, MD  carvedilol (COREG) 25 MG tablet Take 1 tablet (25 mg total) by mouth 2 (two) times daily. 06/28/13   Lars Masson, MD  Cholecalciferol (VITAMIN D PO) Take 1,000 mg by mouth daily.    Historical Provider, MD  colchicine 0.6 MG tablet Take 0.5 tablets (0.3 mg total) by mouth daily. 05/31/13   Kela Millin, MD  [facility] ASPIRIN LOW DOSE 81 MG EC tablet Take 81 mg by mouth daily. 04/11/15   Historical Provider, MD  Cyanocobalamin (VITAMIN B-12 PO) Take 1,000 mg by mouth daily.  Historical Provider, MD  ferrous sulfate 325 (65 FE) MG tablet Take 325 mg by mouth 3 (three) times daily. 04/11/15   Historical Provider, MD  Fluticasone-Salmeterol (ADVAIR) 250-50 MCG/DOSE AEPB Inhale 1 puff into the lungs 2 (two) times daily. 06/09/13   Lars Masson, MD  folic acid (FOLVITE) 1 MG tablet Take 1 mg by mouth daily. 04/11/15   Historical Provider, MD  furosemide (LASIX) 40 MG tablet Take 1 tablet (40 mg total) by mouth daily. 04/17/15   Lars Masson, MD  hydrALAZINE (APRESOLINE) 100 MG tablet Take 100 mg by mouth every 8 (eight) hours. 04/11/15   Historical Provider, MD  ipratropium-albuterol (DUONEB) 0.5-2.5 (3) MG/3ML SOLN Inhale 3 mLs into the lungs every 6 (six) hours as needed. 04/11/15   Historical Provider, MD   isosorbide dinitrate (ISOCHRON) 40 MG CR tablet Take 40 mg by mouth daily. 03/01/15   Historical Provider, MD  lisinopril (PRINIVIL,ZESTRIL) 5 MG tablet Take 5 mg by mouth daily. 04/11/15   Historical Provider, MD  nitroGLYCERIN (NITROSTAT) 0.4 MG SL tablet Place 0.4 mg under the tongue every 5 (five) minutes as needed for chest pain.    Historical Provider, MD  omeprazole (PRILOSEC) 40 MG capsule Take 1 capsule (40 mg total) by mouth daily. 05/31/13   Kela Millin, MD  oxyCODONE (OXY IR/ROXICODONE) 5 MG immediate release tablet Take 1 tablet (5 mg total) by mouth every 4 (four) hours as needed for moderate pain. 06/09/13   Lars Masson, MD  oxyCODONE-acetaminophen (PERCOCET/ROXICET) 5-325 MG per tablet Take 1-2 tablets by mouth every 6 (six) hours as needed for severe pain. 06/09/13   Heather Laisure, PA-C  pantoprazole (PROTONIX) 40 MG tablet Take 1 tablet (40 mg total) by mouth daily. 06/22/13   Ripudeep Jenna Luo, MD  QVAR 80 MCG/ACT inhaler Inhale 2 puffs into the lungs 2 (two) times daily. 04/11/15   Historical Provider, MD  risperiDONE (RISPERDAL M-TABS) 1 MG disintegrating tablet Take 1 tablet (1 mg total) by mouth every evening. 05/31/13   Kela Millin, MD  rosuvastatin (CRESTOR) 10 MG tablet Take 1 tablet (10 mg total) by mouth daily. 06/09/13   Lars Masson, MD  vitamin C (ASCORBIC ACID) 250 MG tablet Take 250 mg by mouth daily. 04/11/15   Historical Provider, MD  VOLTAREN 1 % GEL Apply 1 application topically 4 (four) times daily. Knee pain 02/28/15   Historical Provider, MD   Physical Exam: Filed Vitals:   04/17/15 2230  BP: 147/69  Pulse: 77  Temp:   Resp: 20    BP 147/69 mmHg  Pulse 77  Temp(Src) 97.4 F (36.3 C) (Oral)  Resp 20  Wt 67.586 kg (149 lb)  SpO2 96%  General Appearance:    Alert, oriented, no distress, appears stated age, hoarse voice which patient and family say is baseline  Head:    Normocephalic, atraumatic  Eyes:    PERRL, EOMI, sclera  non-icteric        Nose:   Nares without drainage or epistaxis. Mucosa, turbinates normal  Throat:   Moist mucous membranes. Oropharynx without erythema or exudate.  Neck:   Supple. No carotid bruits.  No thyromegaly.  No lymphadenopathy.   Back:     No CVA tenderness, no spinal tenderness  Lungs:     Clear to auscultation bilaterally, without wheezes, rhonchi or rales  Chest wall:    No tenderness to palpitation  Heart:    Regular rate and rhythm without murmurs, gallops, rubs  Abdomen:     Soft, non-tender, nondistended, normal bowel sounds, no organomegaly  Genitalia:    deferred  Rectal:    deferred  Extremities:   No clubbing, cyanosis or edema.  Pulses:   2+ and symmetric all extremities  Skin:   Skin color, texture, turgor normal, no rashes or lesions  Lymph nodes:   Cervical, supraclavicular, and axillary nodes normal  Neurologic:   CNII-XII intact. Normal strength, sensation and reflexes      throughout    Labs on Admission:  Basic Metabolic Panel:  Recent Labs Lab 04/17/15 0928 04/17/15 2216  NA 144 144  K 4.6 4.2  CL 111* 106  CO2 24 26  GLUCOSE 88 113*  BUN 48* 49*  CREATININE 2.52* 2.77*  CALCIUM 8.7 8.8*   Liver Function Tests:  Recent Labs Lab 04/17/15 0928 04/17/15 2216  AST 20 20  ALT 22 22  ALKPHOS 65 70  BILITOT 0.3 0.3  PROT 6.4 6.6  ALBUMIN 3.5* 3.3*   No results for input(s): LIPASE, AMYLASE in the last 168 hours. No results for input(s): AMMONIA in the last 168 hours. CBC:  Recent Labs Lab 04/17/15 0928 04/17/15 2216  WBC 5.1 5.5  NEUTROABS 3.1 3.3  HGB 6.6* 6.7*  HCT 22.0* 22.0*  MCV 84.9 83.3  PLT 325 325   Cardiac Enzymes: No results for input(s): CKTOTAL, CKMB, CKMBINDEX, TROPONINI in the last 168 hours.  BNP (last 3 results) No results for input(s): PROBNP in the last 8760 hours. CBG: No results for input(s): GLUCAP in the last 168 hours.  Radiological Exams on Admission: No results found.  EKG: Independently  reviewed.  Assessment/Plan Principal Problem:   Symptomatic anemia Active Problems:   Renal insufficiency   HTN (hypertension), malignant   Acute on chronic combined systolic and diastolic CHF (congestive heart failure) (HCC)   1. Symptomatic anemia - 1. Patient receiving 2 unit PRBC transfusion 2. Guiac is negative, but GI source is still possible. 3. Really need more information regarding what was done / what was found during the IP stay at [facility] that prompted them to perform upper EGD and schedule capsule endoscopy. 4. Have sent for outside records from [facility] 5. Will hold off on re-ordering anemia blood work that was likely already done at [facility].  (Per pharmacy he also has a very recent script for iron supplementation added to his med list). 2. CAD s/p CABG - continue ASA, Beta blocker, statin, will hold the ACEi due to renal insufficiency. 3. CKD - appears to be borderline to stage 4, unclear if there is an acute component so will hold his ACEi for now.  Really need records from [facility] to see if today's numbers are new or just progression of his CKD over the past 2 years that he has been out of state. 4. HTN - will continue all home BP meds except ACEi for now (CCB, beta blocker, lasix, hydralazine). 5. CHF - obtaining the 2d Echo that cardiology wants from today's office note.  Will keep patient on lasix 40mg  daily.  Code Status: Full  Family Communication: Family at bedside Disposition Plan: Admit to obs   Time spent: 70 min  GARDNER, JARED M. [facility] Pager [phone]  If 7AM-7PM, please contact the day team taking care of the patient [email or web address] Password [email or web address] 04/17/2015, 11:20 PM

## 2015-04-17 NOTE — ED Notes (Signed)
Dr. Liu at bedside 

## 2015-04-17 NOTE — Patient Instructions (Signed)
Medication Instructions:   INCREASE YOUR LASIX TO 40 MG ONCE DAILY    Labwork:  TODAY---CMET, CBC W DIFF, AND BNP    Testing/Procedures:  Your physician has requested that you have an echocardiogram. Echocardiography is a painless test that uses sound waves to create images of your heart. It provides your doctor with information about the size and shape of your heart and how well your heart's chambers and valves are working. This procedure takes approximately one hour. There are no restrictions for this procedure.     Follow-Up:  1-2 MONTHS WITH DR Delton SeeNELSON     DR Delton SeeNELSON REQUEST THAT YOU CALL  PRIMARY CARE IN [place] TO ESTABLISH CARE WITH ONE OF THEIR PCP AT [phone]

## 2015-04-18 ENCOUNTER — Encounter (HOSPITAL_COMMUNITY): Payer: Self-pay | Admitting: General Practice

## 2015-04-18 ENCOUNTER — Ambulatory Visit (HOSPITAL_BASED_OUTPATIENT_CLINIC_OR_DEPARTMENT_OTHER): Payer: Medicare HMO

## 2015-04-18 DIAGNOSIS — I509 Heart failure, unspecified: Secondary | ICD-10-CM

## 2015-04-18 DIAGNOSIS — N184 Chronic kidney disease, stage 4 (severe): Secondary | ICD-10-CM

## 2015-04-18 DIAGNOSIS — I5022 Chronic systolic (congestive) heart failure: Secondary | ICD-10-CM

## 2015-04-18 DIAGNOSIS — N289 Disorder of kidney and ureter, unspecified: Secondary | ICD-10-CM | POA: Diagnosis not present

## 2015-04-18 DIAGNOSIS — I10 Essential (primary) hypertension: Secondary | ICD-10-CM | POA: Diagnosis not present

## 2015-04-18 DIAGNOSIS — D649 Anemia, unspecified: Secondary | ICD-10-CM | POA: Diagnosis not present

## 2015-04-18 DIAGNOSIS — I5043 Acute on chronic combined systolic (congestive) and diastolic (congestive) heart failure: Secondary | ICD-10-CM | POA: Diagnosis not present

## 2015-04-18 DIAGNOSIS — R1310 Dysphagia, unspecified: Secondary | ICD-10-CM | POA: Diagnosis not present

## 2015-04-18 LAB — BASIC METABOLIC PANEL
ANION GAP: 7 (ref 5–15)
BUN: 48 mg/dL — ABNORMAL HIGH (ref 6–20)
CALCIUM: 8.5 mg/dL — AB (ref 8.9–10.3)
CO2: 27 mmol/L (ref 22–32)
Chloride: 111 mmol/L (ref 101–111)
Creatinine, Ser: 2.71 mg/dL — ABNORMAL HIGH (ref 0.61–1.24)
GFR calc Af Amer: 26 mL/min — ABNORMAL LOW (ref >60)
GFR, EST NON AFRICAN AMERICAN: 22 mL/min — AB (ref >60)
GLUCOSE: 101 mg/dL — AB (ref 65–99)
POTASSIUM: 4.3 mmol/L (ref 3.5–5.1)
SODIUM: 145 mmol/L (ref 135–145)

## 2015-04-18 LAB — CBC
HCT: 21.2 % — ABNORMAL LOW (ref 39.0–52.0)
HCT: 23.1 % — ABNORMAL LOW (ref 39.0–52.0)
Hemoglobin: 6.4 g/dL — CL (ref 13.0–17.0)
Hemoglobin: 7.1 g/dL — ABNORMAL LOW (ref 13.0–17.0)
MCH: 25 pg — ABNORMAL LOW (ref 26.0–34.0)
MCH: 25.5 pg — AB (ref 26.0–34.0)
MCHC: 30.2 g/dL (ref 30.0–36.0)
MCHC: 30.7 g/dL (ref 30.0–36.0)
MCV: 82.8 fL (ref 78.0–100.0)
MCV: 83.1 fL (ref 78.0–100.0)
PLATELETS: 307 10*3/uL (ref 150–400)
PLATELETS: 301 10*3/uL (ref 150–400)
RBC: 2.56 MIL/uL — AB (ref 4.22–5.81)
RBC: 2.78 MIL/uL — ABNORMAL LOW (ref 4.22–5.81)
RDW: 18.9 % — AB (ref 11.5–15.5)
RDW: 18.1 % — ABNORMAL HIGH (ref 11.5–15.5)
WBC: 5.2 10*3/uL (ref 4.0–10.5)
WBC: 6.1 10*3/uL (ref 4.0–10.5)

## 2015-04-18 LAB — APTT: aPTT: 38 seconds — ABNORMAL HIGH (ref 24–37)

## 2015-04-18 LAB — BRAIN NATRIURETIC PEPTIDE: Brain Natriuretic Peptide: 2249.1 pg/mL — ABNORMAL HIGH (ref 0.0–100.0)

## 2015-04-18 LAB — ABO/RH: ABO/RH(D): O POS

## 2015-04-18 MED ORDER — HYDRALAZINE HCL 50 MG PO TABS
100.0000 mg | ORAL_TABLET | Freq: Every day | ORAL | Status: DC
  Administered 2015-04-18 – 2015-04-25 (×6): 100 mg via ORAL
  Filled 2015-04-18 (×6): qty 2

## 2015-04-18 MED ORDER — RESOURCE THICKENUP CLEAR PO POWD
ORAL | Status: DC | PRN
  Filled 2015-04-18: qty 125

## 2015-04-18 MED ORDER — BUDESONIDE 0.5 MG/2ML IN SUSP
0.5000 mg | Freq: Two times a day (BID) | RESPIRATORY_TRACT | Status: DC
  Administered 2015-04-18 – 2015-04-25 (×15): 0.5 mg via RESPIRATORY_TRACT
  Filled 2015-04-18 (×15): qty 2

## 2015-04-18 MED ORDER — ALBUTEROL SULFATE (2.5 MG/3ML) 0.083% IN NEBU: 2.5000 mg | INHALATION_SOLUTION | Freq: Four times a day (QID) | RESPIRATORY_TRACT | Status: DC | PRN

## 2015-04-18 MED ORDER — FERROUS SULFATE 325 (65 FE) MG PO TABS
325.0000 mg | ORAL_TABLET | Freq: Every day | ORAL | Status: DC
  Administered 2015-04-21 – 2015-04-25 (×3): 325 mg via ORAL
  Filled 2015-04-18 (×4): qty 1

## 2015-04-18 MED ORDER — FERROUS SULFATE 325 (65 FE) MG PO TABS: 325.0000 mg | ORAL_TABLET | Freq: Every day | ORAL | Status: DC

## 2015-04-18 NOTE — Evaluation (Signed)
Clinical/Bedside Swallow Evaluation Patient Details  Name: Martin Cain MRN: [id number] Date of Birth: 11-Feb-1946  Today's Date: 04/18/2015 Time: SLP Start Time (ACUTE ONLY): 1105 SLP Stop Time (ACUTE ONLY): 1116 SLP Time Calculation (min) (ACUTE ONLY): 11 min  Past Medical History:  Past Medical History  Diagnosis Date  . Hypertension   . Hyperlipidemia   . CKD (chronic kidney disease)     a. Probable stage III.  Marland Kitchen CAD (coronary artery disease)     a. CABG 2000 in [place]. b. NSTEMI 04/2013 felt due to malignant hypertension (was instructed to f/u ATL for stress test).  . Gout   . CHF (congestive heart failure) (HCC)     a. Echo 04/2013: EF 45-50%, mild AS, mild biatrial enlargement, sev LVH.  . Osteoarthritis     R knee  . Anemia    Past Surgical History:  Past Surgical History  Procedure Laterality Date  . Cardiac surgery      2000; [place]  . Appendectomy    . Abdominal exploration surgery    . Tonsillectomy     HPI:  69 y.o. male with h/o CHF (EF 42% in 2014), CAD s/p CABG in 2000, CKD stage 3, ongoing EtOH abuse, medication non-compliance. He has recently been hospitalized in [place] at [facility] for CHF and anemia.  Admitted with anemia (6.4), acute on chronic CHF.  Noted by RN to be coughing with meals, so swallow evaluation ordered.  Pt reports having swallow evaluated at [facility], but unable to provide details re: findings, other than to say liquids were thickened.     Assessment / Plan / Recommendation Clinical Impression  Pt presents with concerns for dysphagia related to laryngeal function: consumption of liquids leads to immediate wet phonation, explosive coughing.  Baseline phonation with unusually low pitch, diplophonic intermittently.  Pt states he has lost forty pounds over the last year and has had trouble swallowing for at least two months.  Recommend proceeding with FEES to better visualize laryngeal function and determine etiology of dysphagia.   D/W RN.      Aspiration Risk  Moderate    Diet Recommendation NPO; FEES                   SLP Swallow Goals   Pending FEES  Swallow Study Prior Functional Status       General Date of Onset: 04/17/15 Other Pertinent Information: 69 y.o. male with h/o CHF (EF 42% in 2014), CAD s/p CABG in 2000, CKD stage 3, ongoing EtOH abuse, medication non-compliance. He has recently been hospitalized in [place] at [facility] for CHF and anemia.  Admitted with anemia (6.4), acute on chronic CHF.  Noted by RN to be coughing with meals, so swallow evaluation ordered.  Pt reports having swallow evaluated at [facility], but unable to provide details [id number], other than to say liquids were thickened.   Type of Study: Bedside swallow evaluation Diet Prior to this Study: Regular;Thin liquids Temperature Spikes Noted: No Behavior/Cognition: Alert Oral Cavity - Dentition: Adequate natural dentition/normal for age Self-Feeding Abilities: Able to feed self Patient Positioning: Upright in bed Baseline Vocal Quality: Hoarse (low pitch) Volitional Cough: Strong Volitional Swallow: Able to elicit    Oral/Motor/Sensory Function Overall Oral Motor/Sensory Function: Appears within functional limits for tasks assessed   Ice Chips Ice chips: Not tested   Thin Liquid Thin Liquid: Impaired Presentation: Cup Pharyngeal  Phase Impairments: Multiple swallows;Wet Vocal Quality;Cough - Immediate (explosive cough)    Nectar Thick Nectar Thick  Liquid: Not tested   Honey Thick Honey Thick Liquid: Not tested   Puree Puree: Impaired Pharyngeal Phase Impairments: Multiple swallows   Solid   GO Functional Assessment Tool Used: clinical judgement Functional Limitations: Swallowing Swallow Current Status (E9528(G8996): At least 40 percent but less than 60 percent impaired, limited or restricted Swallow Goal Status [phone](G8997): At least 20 percent but less than 40 percent impaired, limited or restricted  Solid: Not tested        Blenda Mountsouture, Kaydn Kumpf Laurice 04/18/2015,11:24 AM

## 2015-04-18 NOTE — Consult Note (Signed)
Referring Provider: Dr. Catha Gosselin Primary Care Physician:  Pcp Not In System Primary Gastroenterologist:  Unassigned  Reason for Consultation:  Anemia  HPI: Martin Cain is a 69 y.o. male with multiple medical problems being seen for anemia with a Hgb 5.7, who has been worked up at [facility] recently with an EGD on 03/30/15. EGD showed a small hiatal hernia and otherwise was unrevealing. Reports normal colonoscopy in 2015 at the [facility] (records not available). Hgb 6.6 on admit here and Hgb 8 on 03/28/15 and 6.2 on 03/26/15 at outside hospital. 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 No history of hematochezia or melena and he is unable to give me any history.   Past Medical History  Diagnosis Date  . Hypertension   . Hyperlipidemia   . CKD (chronic kidney disease)     a. Probable stage III.  Marland Kitchen CAD (coronary artery disease)     a. CABG 2000 in [place]. b. NSTEMI 04/2013 felt due to malignant hypertension (was instructed to f/u ATL for stress test).  . Gout   . CHF (congestive heart failure) (HCC)     a. Echo 04/2013: EF 45-50%, mild AS, mild biatrial enlargement, sev LVH.  . Osteoarthritis     R knee  . Anemia     Past Surgical History  Procedure Laterality Date  . Cardiac surgery      2000; [place]  . Appendectomy    . Abdominal exploration surgery    . Tonsillectomy      Prior to Admission medications   Medication Sig Start Date End Date Taking? Authorizing Provider  albuterol (PROVENTIL HFA;VENTOLIN HFA) 108 (90 BASE) MCG/ACT inhaler Inhale 2 puffs into the lungs every 6 (six) hours as needed for wheezing or shortness of breath. 06/28/13  Yes Lars Masson, MD  allopurinol (ZYLOPRIM) 100 MG tablet Take 100 mg by mouth daily. 04/11/15  Yes Historical Provider, MD  amLODipine (NORVASC) 10 MG tablet Take 1 tablet (10 mg total) by mouth daily. 06/22/13  Yes Ripudeep Jenna Luo, MD   atorvastatin (LIPITOR) 80 MG tablet Take 80 mg by mouth daily. 04/11/15  Yes Historical Provider, MD  carvedilol (COREG) 25 MG tablet Take 1 tablet (25 mg total) by mouth 2 (two) times daily. 06/28/13  Yes Lars Masson, MD  Cholecalciferol (VITAMIN D PO) Take 1,000 mg by mouth daily.   Yes Historical Provider, MD  colchicine 0.6 MG tablet Take 0.5 tablets (0.3 mg total) by mouth daily. 05/31/13  Yes Adeline Joselyn Glassman, MD  CVS ASPIRIN LOW DOSE 81 MG EC tablet Take 81 mg by mouth daily. 04/11/15  Yes Historical Provider, MD  Cyanocobalamin (VITAMIN B-12 PO) Take 1,000 mg by mouth daily.   Yes Historical Provider, MD  ferrous sulfate 325 (65 FE) MG tablet Take 325 mg by mouth every morning.  04/11/15  Yes Historical Provider, MD  Fluticasone-Salmeterol (ADVAIR) 250-50 MCG/DOSE AEPB Inhale 1 puff into the lungs 2 (two) times daily. 06/09/13  Yes Lars Masson, MD  folic acid (FOLVITE) 1 MG tablet Take 1 mg by mouth daily. 04/11/15  Yes Historical Provider, MD  furosemide (LASIX) 40 MG tablet Take 1 tablet (40 mg total) by mouth daily. 04/17/15  Yes Lars Masson, MD  hydrALAZINE (APRESOLINE) 100 MG tablet Take 100 mg by mouth every morning.  04/11/15  Yes Historical Provider, MD  ipratropium-albuterol (DUONEB) 0.5-2.5 (3) MG/3ML SOLN  Inhale 3 mLs into the lungs every 6 (six) hours as needed (shortness of breath).  04/11/15  Yes Historical Provider, MD  isosorbide dinitrate (ISOCHRON) 40 MG CR tablet Take 40 mg by mouth daily. 03/01/15  Yes Historical Provider, MD  lisinopril (PRINIVIL,ZESTRIL) 5 MG tablet Take 5 mg by mouth daily. 04/11/15  Yes Historical Provider, MD  nitroGLYCERIN (NITROSTAT) 0.4 MG SL tablet Place 0.4 mg under the tongue every 5 (five) minutes as needed for chest pain.   Yes Historical Provider, MD  omeprazole (PRILOSEC) 40 MG capsule Take 1 capsule (40 mg total) by mouth daily. 05/31/13  Yes Adeline C Viyuoh, MD  pantoprazole (PROTONIX) 40 MG tablet Take 1 tablet (40 mg  total) by mouth daily. 06/22/13  Yes Ripudeep Jenna Luo, MD  QVAR 80 MCG/ACT inhaler Inhale 2 puffs into the lungs 2 (two) times daily. 04/11/15  Yes Historical Provider, MD  risperiDONE (RISPERDAL M-TABS) 1 MG disintegrating tablet Take 1 tablet (1 mg total) by mouth every evening. 05/31/13  Yes Adeline Joselyn Glassman, MD  vitamin C (ASCORBIC ACID) 250 MG tablet Take 250 mg by mouth daily. 04/11/15  Yes Historical Provider, MD  VOLTAREN 1 % GEL Apply 1 application topically 4 (four) times daily as needed (pain). Knee pain 02/28/15  Yes Historical Provider, MD    Scheduled Meds: . allopurinol  100 mg Oral Daily  . amLODipine  10 mg Oral Daily  . atorvastatin  80 mg Oral q1800  . budesonide (PULMICORT) nebulizer solution  0.5 mg Nebulization BID  . carvedilol  25 mg Oral BID  . ferrous sulfate  325 mg Oral Q breakfast  . folic acid  1 mg Oral Daily  . furosemide  40 mg Oral Daily  . hydrALAZINE  100 mg Oral Daily  . isosorbide dinitrate  40 mg Oral Daily  . mometasone-formoterol  2 puff Inhalation BID  . multivitamin with minerals  1 tablet Oral Daily  . pantoprazole  40 mg Oral Daily  . sodium chloride  3 mL Intravenous Q12H  . thiamine  100 mg Oral Daily   Or  . thiamine  100 mg Intravenous Daily   Continuous Infusions:  PRN Meds:.albuterol, ipratropium-albuterol, LORazepam **OR** LORazepam  Allergies as of 04/17/2015  . (No Known Allergies)    Family History  Problem Relation Age of Onset  . Heart disease      No family history  . Cancer Father   . Hypertension Father     Social History   Social History  . Marital Status: Divorced    Spouse Name: N/A  . Number of Children: 2  . Years of Education: N/A   Occupational History  . Not on file.   Social History Main Topics  . Smoking status: Current Every Day Smoker -- 0.50 packs/day for 40 years    Types: Cigarettes  . Smokeless tobacco: Never Used  . Alcohol Use: Yes     Comment: previously admitted to 1 pint per day.  denies use for 1 month.  . Drug Use: No  . Sexual Activity: Not on file   Other Topics Concern  . Not on file   Social History Narrative    Review of Systems: All negative except as stated above in HPI.  Physical Exam: Vital signs: Filed Vitals:   04/18/15 1002  BP: 127/57  Pulse: 67  Temp: 98.1 F (36.7 C)  Resp: 18   Last BM Date: 04/17/15 General:   Lethargic, thin, no acute distress Head: atraumatic  Eyes: anicteric ENT: oropharynx clear Neck: supple, nontender Lungs:  Clear throughout to auscultation.   No wheezes, crackles, or rhonchi. No acute distress. Heart:  Regular rate and rhythm; no murmurs, clicks, rubs,  or gallops. Abdomen: epigastric and periumbilical tenderness with guarding, soft, nondistended, +BS  Rectal:  Deferred Ext: no edema Skin: no rash  GI:  Lab Results:  Recent Labs  04/17/15 0928 04/17/15 2216 04/18/15 0210  WBC 5.1 5.5 5.2  HGB 6.6* 6.7* 6.4*  HCT 22.0* 22.0* 21.2*  PLT 325 325 307   BMET  Recent Labs  04/17/15 0928 04/17/15 2216 04/18/15 0210  NA 144 144 145  K 4.6 4.2 4.3  CL 111* 106 111  CO2 24 26 27   GLUCOSE 88 113* 101*  BUN 48* 49* 48*  CREATININE 2.52* 2.77* 2.71*  CALCIUM 8.7 8.8* 8.5*   LFT  Recent Labs  04/17/15 2216  PROT 6.6  ALBUMIN 3.3*  AST 20  ALT 22  ALKPHOS 70  BILITOT 0.3   PT/INR  Recent Labs  04/17/15 2216  LABPROT 15.9*  INR 1.26     Studies/Results: No results found.  Impression/Plan: 69 yo with anemia likely acute on chronic (heme negative) in need of a capsule endoscopy when it is safe for him to swallow the capsule. He is undergoing a speech path workup and will await their recs prior to doing capsule endoscopy. Capsule could be placed endoscopically but no evidence of an active GI bleed to warrant an urgent repeat EGD for evaluation and placement of a capsule endoscopy. Will tentatively plan on doing capsule endoscopy on Friday if he is cleared to swallow POs. Will  follow.      Ema Hebner C.  04/18/2015, 12:37 PM  Pager [phone]  If no answer or after 5 PM call [phone]

## 2015-04-18 NOTE — Progress Notes (Signed)
  Echocardiogram 2D Echocardiogram has been performed.  Cathie BeamsGREGORY, Cree Napoli 04/18/2015, 2:24 PM

## 2015-04-18 NOTE — Progress Notes (Signed)
Noticed pat having  Very difficult time eating breakfast. Pt was coughing and getting strangled while trying to eat and drink this am. Paged Dr. Catha GosselinMikhail concerning this, and speech evaluation was ordered.  Pt  States that he has been having this problem for about 2 months. Will hold meal tray for lunch.

## 2015-04-18 NOTE — Progress Notes (Addendum)
[facility]                                                                              Patient Demographics  Read DriversFrank Mawhinney, is a 69 y.o. male, DOB - 06/05/1946, ZOX:[id number]  Admit date - 04/17/2015   Admitting Physician Hillary BowJared M Gardner, DO  Outpatient Primary MD for the patient is Pcp Not In System  LOS -    Chief Complaint  Patient presents with  . Anemia      HPI on 04/17/2015 by Dr. Lyda PeroneJared Gardner Read DriversFrank Hobbs is a 69 y.o. male with h/o CHF (EF 42% in 2014), CAD s/p CABG in 2000, CKD stage 3, ongoing EtOH abuse, medication non-compliance. [place] has [place] at [facility] for CHF and also anemia with HGB of 5.7. Obtaining the formal records is pending but it is known that during that admit he had a normal EGD on 03/30/15, normal colonoscopy a year ago. He was transfused PRBC units and discharged with plan for outpatient capsule endoscopy. Before this could be completed however, [place] [facility] Today he was seen at the cardiologists office here in town for follow up. Labs drawn at the cardiologists office came back showing HGB of 6.6. Patient was sent into the ED.  Assessment & Plan   Symptomatic anemia -Hemoglobin upon admission 6.4 -2 units packed red blood cells ordered -Continue to monitor hemoglobin -FOBT negative -Patient was recently admitted to [facility] in [place] for anemia, was supposed to have capsule endoscopy as an outpatient however [place] -03/30/2015 EGD showed atypical mucosa suggesting benign Brunner's gland nodule  Acute on chronic combined systolic and diastolic CHF -Continue to monitor daily weights, intake and output -EF 2014 was 42% -Echocardiogram pending -Continue lasix   Coronary artery disease -Currently stable, no complaints of chest pain -Status post CABG -Continue aspirin, Coreg, statin -ACEi held  Chronic kidney disease, stage 4 -review patient's records,  baseline creatinine approximately 2.5 -Continue to monitor BMP  Hypertension -Continue Lasix, hydralazine,isochron, Coreg, amlodipine  Code Status: Full  Family Communication: None at bedside  Disposition Plan: Admitted, pending workup  Time Spent in minutes   30 minutes  Procedures  None  Consults   None  DVT Prophylaxis  SCDs  Lab Results  Component Value Date   PLT 307 04/18/2015    Medications  Scheduled Meds: . allopurinol  100 mg Oral Daily  . amLODipine  10 mg Oral Daily  . atorvastatin  80 mg Oral q1800  . budesonide (PULMICORT) nebulizer solution  0.5 mg Nebulization BID  . carvedilol  25 mg Oral BID  . ferrous sulfate  325 mg Oral Q breakfast  . folic acid  1 mg Oral Daily  . furosemide  40 mg Oral Daily  . hydrALAZINE  100 mg Oral Daily  . isosorbide dinitrate  40 mg Oral Daily  . mometasone-formoterol  2 puff Inhalation BID  . multivitamin with minerals  1 tablet Oral Daily  . pantoprazole  40 mg Oral Daily  . sodium chloride  3 mL Intravenous Q12H  . thiamine  100 mg Oral Daily   Or  . thiamine  100 mg Intravenous Daily   Continuous Infusions:  PRN Meds:.albuterol, ipratropium-albuterol, LORazepam **OR** LORazepam  Antibiotics    Anti-infectives    None     Subjective:   [name] seen and examined today.  Patient states he's feeling better than he did. Denies any chest pain, shortness of breath, dizziness or headache. Denies abdominal pain, nausea or vomiting.  Objective:   Filed Vitals:   04/18/15 0252 04/18/15 0614 04/18/15 0754 04/18/15 1002  BP: 136/69 154/69  127/57  Pulse: 84 83 82 67  Temp: 98.3 F (36.8 C) 98.6 F (37 C)  98.1 F (36.7 C)  TempSrc: Oral Oral  Oral  Resp: Height:      Weight:      SpO2: 99% 100% 100% 97%    Wt Readings from Last 3 Encounters:  04/18/15 63.93 kg (140 lb 15 oz)  04/17/15 67.405 kg (148 lb 9.6 oz)  06/28/13 56.246 kg (124 lb)     Intake/Output Summary (Last 24  hours) at 04/18/15 1019 Last data filed at 04/18/15 1610  Gross per 24 hour  Intake    545 ml  Output      0 ml  Net    545 ml    Exam  General: Well developed, well nourished, NAD, appears stated age  HEENT: NCAT,  mucous membranes moist.   Cardiovascular: S1 S2 auscultated, no rubs, murmurs or gallops. Regular rate and rhythm.  Respiratory: Clear to auscultation  Abdomen: Soft, nontender, nondistended, + bowel sounds  Extremities: warm dry without cyanosis clubbing or edema  Neuro: AAOx3, nonfocal  Data Review   Micro Results No results found for this or any previous visit (from the past 240 hour(s)).  Radiology Reports No results found.  CBC  Recent Labs Lab 04/17/15 0928 04/17/15 2216 04/18/15 0210  WBC 5.1 5.5 5.2  HGB 6.6* 6.7* 6.4*  HCT 22.0* 22.0* 21.2*  PLT 325 325 307  MCV 84.9 83.3 82.8  MCH 25.5* 25.4* 25.0*  MCHC 30.0 30.5 30.2  RDW 19.3* 19.0* 18.9*  LYMPHSABS 1.6 1.7  --   MONOABS 0.4 0.5  --   EOSABS 0.1 0.0  --   BASOSABS 0.0 0.0  --     Chemistries   Recent Labs Lab 04/17/15 0928 04/17/15 2216 04/18/15 0210  NA 144 144 145  K 4.6 4.2 4.3  CL 111* 106 111  CO2 GLUCOSE 88 113* 101*  BUN 48* 49* 48*  CREATININE 2.52* 2.77* 2.71*  CALCIUM 8.7 8.8* 8.5*  AST 20 20  --   ALT 22 22  --   ALKPHOS 65 70  --   BILITOT 0.3 0.3  --    ------------------------------------------------------------------------------------------------------------------ estimated creatinine clearance is 22.9 mL/min (by C-G formula based on Cr of 2.71). ------------------------------------------------------------------------------------------------------------------ No results for input(s): HGBA1C in the last 72 hours. ------------------------------------------------------------------------------------------------------------------ No results for input(s): CHOL, HDL, LDLCALC, TRIG, CHOLHDL, LDLDIRECT in the last 72  hours. ------------------------------------------------------------------------------------------------------------------ No results for input(s): TSH, T4TOTAL, T3FREE, THYROIDAB in the last 72 hours.  Invalid input(s): FREET3 ------------------------------------------------------------------------------------------------------------------ No results for input(s): VITAMINB12, FOLATE, FERRITIN, TIBC, IRON, RETICCTPCT in the last 72 hours.  Coagulation profile  Recent Labs Lab 04/17/15 2216  INR 1.26    No results for input(s): DDIMER in the last 72 hours.  Cardiac Enzymes No results for input(s): CKMB, TROPONINI, MYOGLOBIN in the last 168 hours.  Invalid input(s): CK ------------------------------------------------------------------------------------------------------------------ Invalid input(s): POCBNP    Zelena Bushong D.O. on  04/18/2015 at 10:19 AM  Between 7am to 7pm - Pager - [phone]  After 7pm go to [email or web address] - password [email or web address]  And look for the night coverage person covering for me after hours  [facility] Office  [phone]

## 2015-04-18 NOTE — Procedures (Signed)
Objective Swallowing Evaluation:  (FEES)  Patient Details  Name: Martin Cain MRN: [id number] Date of Birth: 08/13/1945  Today's Date: 04/18/2015 Time: SLP Start Time (ACUTE ONLY): 1400-SLP Stop Time (ACUTE ONLY): 1515 SLP Time Calculation (min) (ACUTE ONLY): 75 min  Past Medical History:  Past Medical History  Diagnosis Date  . Hypertension   . Hyperlipidemia   . CKD (chronic kidney disease)     a. Probable stage III.  Marland Kitchen. CAD (coronary artery disease)     a. CABG 2000 [place]. b. NSTEMI 04/2013 felt due to malignant hypertension (was instructed to f/u ATL for stress test).  . Gout   . CHF (congestive heart failure) (HCC)     a. Echo 04/2013: EF 45-50%, mild AS, mild biatrial enlargement, sev LVH.  . Osteoarthritis     R knee  . Anemia    Past Surgical History:  Past Surgical History  Procedure Laterality Date  . Cardiac surgery      2000; [place]  . Appendectomy    . Abdominal exploration surgery    . Tonsillectomy     HPI:  Other Pertinent Information: 69 y.o. male with h/o CHF (EF 42% in 2014), CAD s/p CABG in 2000, CKD stage 3, ongoing EtOH abuse, medication non-compliance. He has recently been [place] [facility] for [facility] and anemia. Was D/cd with plan to move to [place] with dtr so that she can be more involved in his medical care.  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  His dtr does not recall being advised to schedule f/u with a local neurologist.  [facility] with anemia (6.4), acute on chronic CHF.   Per records, pt had an MBS on 03/29/15 which revealed a severe dysphagia with poor pharyngeal clearance and eventual sensed aspiration of thin liquids.  Per bedside exam 10/19, pt presented with disordered phonation with low pitch; poor management of secretions; constant cough during meal times.  FEES recommended 10/19.   No Data Recorded  Assessment / Plan /  Recommendation CHL IP CLINICAL IMPRESSIONS 04/18/2015  Therapy Diagnosis Severe pharyngeal phase dysphagia  Clinical Impression FEES exam revealed unremarkable larynx/pharynx, excluding copious standing secretions, which consistently penetrated to the vocal cords, causing pt to cough and contributed to his wet-sounding voice.  Pt presented with a likely neurogenic mod-severe pharyngeal dysphagia marked by poor clearance of POs through the pharynx, consistent penetration of POs, and possible backflow of POs through UES back into hypopharynx.  Significant residue mixed with standing secretions, all of which continued to spill into larynx to level of vocal cords, eliciting a cough, which would eject penetrates - a portion of which would be swallowed; a portion of which would fall back into larynx.  Despite the constant and explosive coughing, limited aspiration was visualized.  D/W Dr. Catha GosselinMikhail - given no development of pna, recommend continuing a cautious PO diet (dysphagia 2, nectars); recommend neurology f/u to assist with dx of etiology of dysphagia; recommend OPSLP to address dysphagia and voice.  D/w pt and dtr; will follow acutely.       CHL IP TREATMENT RECOMMENDATION 04/18/2015  Treatment Recommendations Therapy as outlined in treatment plan below     CHL IP DIET RECOMMENDATION 04/18/2015  SLP Diet Recommendations Dysphagia 2 (Fine chop);Nectar  Liquid Administration via (None)  Medication Administration Crushed with puree  Compensations Slow rate;Small sips/bites;Effortful swallow  Postural Changes and/or Swallow Maneuvers (None)  CHL IP OTHER RECOMMENDATIONS 04/18/2015  Recommended Consults (No Data)  Oral Care Recommendations Oral care BID  Other Recommendations Order thickener from pharmacy     No flowsheet data found.   CHL IP FREQUENCY AND DURATION 04/18/2015  Speech Therapy Frequency (ACUTE ONLY) min 3x week  Treatment Duration 1 week     Aadil Sur L. Samson Frederic, [place] CCC/SLP Pager  [phone]

## 2015-04-19 DIAGNOSIS — I5043 Acute on chronic combined systolic (congestive) and diastolic (congestive) heart failure: Secondary | ICD-10-CM | POA: Diagnosis not present

## 2015-04-19 DIAGNOSIS — N184 Chronic kidney disease, stage 4 (severe): Secondary | ICD-10-CM | POA: Diagnosis not present

## 2015-04-19 DIAGNOSIS — D649 Anemia, unspecified: Secondary | ICD-10-CM | POA: Diagnosis not present

## 2015-04-19 DIAGNOSIS — I10 Essential (primary) hypertension: Secondary | ICD-10-CM | POA: Diagnosis not present

## 2015-04-19 LAB — TYPE AND SCREEN
ABO/RH(D): O POS
Antibody Screen: NEGATIVE
Unit division: 0

## 2015-04-19 LAB — BASIC METABOLIC PANEL
ANION GAP: 6 (ref 5–15)
BUN: 46 mg/dL — ABNORMAL HIGH (ref 6–20)
CHLORIDE: 112 mmol/L — AB (ref 101–111)
CO2: 26 mmol/L (ref 22–32)
CREATININE: 2.61 mg/dL — AB (ref 0.61–1.24)
Calcium: 8.4 mg/dL — ABNORMAL LOW (ref 8.9–10.3)
GFR calc non Af Amer: 23 mL/min — ABNORMAL LOW (ref >60)
GFR, EST AFRICAN AMERICAN: 27 mL/min — AB (ref >60)
Glucose, Bld: 89 mg/dL (ref 65–99)
POTASSIUM: 4.3 mmol/L (ref 3.5–5.1)
Sodium: 144 mmol/L (ref 135–145)

## 2015-04-19 LAB — CBC
HEMATOCRIT: 23.2 % — AB (ref 39.0–52.0)
HEMOGLOBIN: 7.3 g/dL — AB (ref 13.0–17.0)
MCH: 26.1 pg (ref 26.0–34.0)
MCHC: 31.5 g/dL (ref 30.0–36.0)
MCV: 82.9 fL (ref 78.0–100.0)
Platelets: 306 10*3/uL (ref 150–400)
RBC: 2.8 MIL/uL — AB (ref 4.22–5.81)
RDW: 17.9 % — ABNORMAL HIGH (ref 11.5–15.5)
WBC: 6.2 10*3/uL (ref 4.0–10.5)

## 2015-04-19 MED ORDER — ACETAMINOPHEN 650 MG RE SUPP
650.0000 mg | Freq: Four times a day (QID) | RECTAL | Status: DC | PRN
  Administered 2015-04-19 – 2015-04-22 (×3): 650 mg via RECTAL
  Filled 2015-04-19 (×4): qty 1

## 2015-04-19 NOTE — Progress Notes (Signed)
[facility]                                                                              Patient Demographics  Martin Cain, is a 69 y.o. male, DOB - [birth year], ZOX:[id number]  Admit date - 04/17/2015   Admitting Physician Hillary Bow, DO  Outpatient Primary MD for the patient is Pcp Not In System  LOS -    Chief Complaint  Patient presents with  . Anemia      HPI on 04/17/2015 by Dr. Lyda Perone Cain Martin is a 69 y.o. male with h/o CHF (EF 42% in 2014), CAD s/p CABG in 2000, CKD stage 3, ongoing EtOH abuse, medication non-compliance. He has recently been hospitalized in [place] at [facility] for CHF and also anemia with HGB of 5.7. Obtaining the formal records is pending but it is known that during that admit he had a normal EGD on 03/30/15, normal colonoscopy a year ago. He was transfused PRBC units and discharged with plan for outpatient capsule endoscopy. Before this could be completed however, he moved to [facility]. Today he was seen at the cardiologists office here in town for follow up. Labs drawn at the cardiologists office came back showing HGB of 6.6. Patient was sent into the ED.  Assessment & Plan   Symptomatic anemia -Hemoglobin upon admission 6.4, received 2uPRBCs, Hb 7.3 -Continue to monitor hemoglobin -FOBT negative -Patient was recently admitted to [facility] in [place] for anemia, was supposed to have capsule endoscopy as an outpatient however patient moved to [place] -03/30/2015 EGD showed atypical mucosa suggesting benign Brunner's gland nodule -Gastroenterology consulted and appreciated, possible capsule endoscopy tomorrow  Acute on chronic combined systolic and diastolic CHF -Continue to monitor daily weights, intake and output -EF 2014 was 42%, According to records from [place], last EF 12/2014: 10-20% on TTE, TEE <20% -Echocardiogram EF 25-30% -Continue lasix, monitor daily weights, I/Os  Coronary artery  disease -Currently stable, no complaints of chest pain -Status post CABG -Continue aspirin, Coreg, statin -ACEi held  Chronic kidney disease, stage 4 -review patient's records, baseline creatinine approximately 2.5 -Continue to monitor BMP  Hypertension -Continue Lasix, hydralazine,isochron, Coreg, amlodipine  Dysphagia -Patient has a 40lb weight loss.  Speech therapy consult placed patient on dysphagia 2 diet. Patient continues to have moments of aspiration. Per review of records from [place], suspicion of PSP (discharge summary 09/16/2014) -Spoke with neurology, needs outpatient workup.  Code Status: Full  Family Communication: None at bedside  Disposition Plan: Admitted, pending workup  Time Spent in minutes   30 minutes  Procedures  None  Consults   Gastroenterology Neurology via phone  DVT Prophylaxis  SCDs  Lab Results  Component Value Date   PLT 306 04/19/2015    Medications  Scheduled Meds: . allopurinol  100 mg Oral Daily  . amLODipine  10 mg Oral Daily  . atorvastatin  80 mg Oral q1800  . budesonide (PULMICORT) nebulizer solution  0.5 mg Nebulization BID  . carvedilol  25 mg Oral BID  . ferrous sulfate  325 mg Oral Q breakfast  . folic acid  1 mg Oral Daily  . furosemide  40 mg  Oral Daily  . hydrALAZINE  100 mg Oral Daily  . isosorbide dinitrate  40 mg Oral Daily  . mometasone-formoterol  2 puff Inhalation BID  . multivitamin with minerals  1 tablet Oral Daily  . pantoprazole  40 mg Oral Daily  . sodium chloride  3 mL Intravenous Q12H  . thiamine  100 mg Oral Daily   Or  . thiamine  100 mg Intravenous Daily   Continuous Infusions:  PRN Meds:.albuterol, ipratropium-albuterol, LORazepam **OR** LORazepam, RESOURCE THICKENUP CLEAR  Antibiotics    Anti-infectives    None     Subjective:   Read DriversFrank Crockett seen and examined today.  Patient states he's feeling better.  Denies any chest pain, shortness of breath, dizziness or headache. Denies  abdominal pain, nausea or vomiting.  Admits to problems swallowing and coughing.  Does not want a peg tube.  Objective:   Filed Vitals:   04/19/15 0017 04/19/15 0501 04/19/15 0755 04/19/15 1156  BP: 141/66 152/73  162/67  Pulse: 90 88  87  Temp: 98.4 F (36.9 C) 98.5 F (36.9 C)  98.7 F (37.1 C)  TempSrc: Oral Oral  Oral  Resp: 18 18    Height:      Weight:  63.866 kg (140 lb 12.8 oz)    SpO2: 96% 100% 94% 89%    Wt Readings from Last 3 Encounters:  04/19/15 63.866 kg (140 lb 12.8 oz)  04/17/15 67.405 kg (148 lb 9.6 oz)  06/28/13 56.246 kg (124 lb)     Intake/Output Summary (Last 24 hours) at 04/19/15 1206 Last data filed at 04/19/15 0855  Gross per 24 hour  Intake    360 ml  Output    675 ml  Net   -315 ml    Exam  General: Well developed, thin, NAD  HEENT: NCAT,  mucous membranes moist.   Cardiovascular: S1 S2 auscultated, RRR  Respiratory: Clear to auscultation  Abdomen: Soft, nontender, nondistended, + bowel sounds  Extremities: warm dry without cyanosis clubbing or edema  Neuro: AAOx3, nonfocal  Psych: Appropriate mood and affect  Data Review   Micro Results No results found for this or any previous visit (from the past 240 hour(s)).  Radiology Reports No results found.  CBC  Recent Labs Lab 04/17/15 0928 04/17/15 2216 04/18/15 0210 04/18/15 2235 04/19/15 0247  WBC 5.1 5.5 5.2 6.1 6.2  HGB 6.6* 6.7* 6.4* 7.1* 7.3*  HCT 22.0* 22.0* 21.2* 23.1* 23.2*  PLT 325 325 307 301 306  MCV 84.9 83.3 82.8 83.1 82.9  MCH 25.5* 25.4* 25.0* 25.5* 26.1  MCHC 30.0 30.5 30.2 30.7 31.5  RDW 19.3* 19.0* 18.9* 18.1* 17.9*  LYMPHSABS 1.6 1.7  --   --   --   MONOABS 0.4 0.5  --   --   --   EOSABS 0.1 0.0  --   --   --   BASOSABS 0.0 0.0  --   --   --     Chemistries   Recent Labs Lab 04/17/15 0928 04/17/15 2216 04/18/15 0210 04/19/15 0247  NA 144 144 145 144  K 4.6 4.2 4.3 4.3  CL 111* 106 111 112*  CO2 24 26 27 26   GLUCOSE 88 113* 101* 89   BUN 48* 49* 48* 46*  CREATININE 2.52* 2.77* 2.71* 2.61*  CALCIUM 8.7 8.8* 8.5* 8.4*  AST 20 20  --   --   ALT 22 22  --   --   ALKPHOS 65 70  --   --  BILITOT 0.3 0.3  --   --    ------------------------------------------------------------------------------------------------------------------ estimated creatinine clearance is 23.8 mL/min (by C-G formula based on Cr of 2.61). ------------------------------------------------------------------------------------------------------------------ No results for input(s): HGBA1C in the last 72 hours. ------------------------------------------------------------------------------------------------------------------ No results for input(s): CHOL, HDL, LDLCALC, TRIG, CHOLHDL, LDLDIRECT in the last 72 hours. ------------------------------------------------------------------------------------------------------------------ No results for input(s): TSH, T4TOTAL, T3FREE, THYROIDAB in the last 72 hours.  Invalid input(s): FREET3 ------------------------------------------------------------------------------------------------------------------ No results for input(s): VITAMINB12, FOLATE, FERRITIN, TIBC, IRON, RETICCTPCT in the last 72 hours.  Coagulation profile  Recent Labs Lab 04/17/15 2216  INR 1.26    No results for input(s): DDIMER in the last 72 hours.  Cardiac Enzymes No results for input(s): CKMB, TROPONINI, MYOGLOBIN in the last 168 hours.  Invalid input(s): CK ------------------------------------------------------------------------------------------------------------------ Invalid input(s): POCBNP    Zuriyah Shatz D.O. on 04/19/2015 at 12:06 PM  Between 7am to 7pm - Pager - [phone]  After 7pm go to [email or web address] - password [email or web address]  And look for the night coverage person covering for me after hours  [facility] Office  [phone]

## 2015-04-19 NOTE — Progress Notes (Signed)
Patient states right knee is achy feeling. Notiied on-call hospitalist and requested liquid or per rectum route. Orders given for tylenol per rectum and was given as ordered. Will continue to monitor patient to end of shift.

## 2015-04-19 NOTE — Progress Notes (Signed)
Pt ate one or two bites of scrambled egg this am with excessive and prolonged coughing even after attempting to suction with yankaur. Tray removed from room and pt seen by speech therapist.

## 2015-04-19 NOTE — Progress Notes (Signed)
Speech Language Pathology Treatment:  Patient Details Name: Martin Cain MRN: [id number] DOB: 12/29/1945 Today's Date: 04/19/2015 Time: [id number] SLP Time Calculation (min) (ACUTE ONLY): 16 min     SLP discussed swallow status with Dr. Catha GosselinMikhail this morning. SLP returned to room to further discuss alternate means nutrition with pt. He stated he would be open to learning/hearing about option with GI doctor. Text paged Dr. Catha GosselinMikhail to notify her.       Breck CoonsLisa Willis MabletonLitaker M.Ed [employer]-SLP Pager [phone]

## 2015-04-19 NOTE — Progress Notes (Signed)
Speech Language Pathology Treatment: Dysphagia  Patient Details Name: Read DriversFrank Cabacungan MRN: [id number] DOB: 11/28/1945 Today's Date: 04/19/2015 Time: [id number] SLP Time Calculation (min) (ACUTE ONLY): 16 min  Assessment / Plan / Recommendation Clinical Impression  SLP paged by PT who reported pt coughing hard and continuously during breakfast after one-two bites. FEES report reviewed from yesterday; it was decided to initiate cautious diet of Dys 2, nectar liquids with recommendation for  neurology f/u to assist with dx of etiology of dysphagia and out patient SLP to address dysphagia and voice. SLP arrived to room to find continuously coughing, significantly wet vocal quality and inability to clear secretions/phlegm/pharygneal residue. This therapist assisted with verbal cueing for hard swallows, attempts to "hock" bolus using Yankeurs. Coughing subsided for 5 minutes but continues as note is written. Pt stated he got "choked" on eggs. RN conveyed pt apparently has been told he would benefit from PEG while in [place] but refused. Presently etiology of dysphagia is unknown. Recommend pt be NPO, neurology work up as soon as possible. GI's note reviewed, do not feel he will be able to safely swallow a capusule for endoscopy. He may need alternative means of nutrition.    HPI Other Pertinent Information: 69 y.o. male with h/o CHF (EF 42% in 2014), CAD s/p CABG in 2000, CKD stage 3, ongoing EtOH abuse, medication non-compliance. [place] has recently been [place] at [facility] for CHF and anemia. Was D/cd with plan to move to [place] with dtr so that she can be more involved in his medical care.  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  His dtr does not recall being advised to schedule f/u with a local neurologist.  [facility] with [facility] (6.4), acute on chronic CHF.   Per records, pt had an MBS on 03/29/15 which revealed  a severe dysphagia with poor pharyngeal clearance and eventual sensed aspiration of thin liquids.  Per bedside exam 10/19, pt presented with disordered phonation with low pitch; poor management of secretions; constant cough during meal times.  FEES recommended 10/19.    Pertinent Vitals Pain Assessment: Faces Faces Pain Scale: Hurts even more Pain Location:  (coughing continuous) Pain Descriptors / Indicators: Tightness Pain Intervention(s): Monitored during session  SLP Plan  Continue with current plan of care    Recommendations Diet recommendations: NPO              Oral Care Recommendations: Oral care BID Follow up Recommendations: Outpatient SLP (?) Plan: Continue with current plan of care    GO     Royce MacadamiaLitaker, Ashni Lonzo Willis 04/19/2015, 9:22 AM   Breck CoonsLisa Willis Lonell FaceLitaker M.Ed [employer]-SLP Pager [phone]

## 2015-04-19 NOTE — Evaluation (Signed)
Physical Therapy Evaluation Patient Details Name: Martin Cain MRN: [id number] DOB: 05-Apr-1946 Today's Date: 04/19/2015   History of Present Illness  Pt is a 69 y/o M with PMH of HTN, HLD, CKD, CAD, gout, CHF, dysphagia, and ongoing alcohol abuse.  Pt with multiple medical problems being seen for anemia.  Clinical Impression  Pt presents with impairments as indicated below.  Pt will benefit from further acute PT services to maximize safety with functional mobility and increased activity tolerance.  Pending pt progress, PT recommends 24 hour supervision and HHPT upon discharge.    Follow Up Recommendations Home health PT;Supervision/Assistance - 24 hour    Equipment Recommendations  None recommended by PT    Recommendations for Other Services OT consult     Precautions / Restrictions Precautions Precautions: Fall Restrictions Weight Bearing Restrictions: No      Mobility  Bed Mobility Overal bed mobility: Needs Assistance Bed Mobility: Supine to Sit;Sit to Supine     Supine to sit: Modified independent (Device/Increase time) (bed rails and increased time) Sit to supine: Modified independent (Device/Increase time) (bed rails and increased time; able to scoot toward [facility])      Transfers Overall transfer level: Needs assistance Equipment used: Rolling walker (2 wheeled) Transfers: Sit to/from Stand Sit to Stand: Min guard (min guard for stability; VCs for hand placement)            Ambulation/Gait Ambulation/Gait assistance: Min guard Ambulation Distance (Feet): 30 Feet Assistive device: Rolling walker (2 wheeled) Gait Pattern/deviations: Shuffle;Festinating;Decreased stride length;Decreased step length - right;Decreased step length - left Gait velocity: slower Gait velocity interpretation: Below normal speed for age/gender General Gait Details: Very rigid movements with shuffling gait  Able to increase step length some with frequent verbal cues for big steps.  Very  rigid  movements of all 4 extremities and neck.  Pt with decreased awareness of obstacles as he does not flex neck to look down much.  Unstable with gait, requiring min guard for safety and stability.  Pt occasionally lifting RW off ground completely due to rigidity and decreased stability.  Prior to ambulation  on room air, SpO2 91%.  With ambulation O2 sats decreased to 86% and rebounding quickly with sitting to 92%.  Coughing continued throughout.  Stairs            Wheelchair Mobility    Modified Rankin (Stroke Patients Only)       Balance Overall balance assessment: Needs assistance Sitting-balance support: Feet supported Sitting balance-Leahy Scale: Fair     Standing balance support: During functional activity;Bilateral upper extremity supported Standing balance-Leahy Scale: Fair Standing balance comment: Patient very rigid and unsteady in standing                             Pertinent Vitals/Pain Pain Assessment: Faces Faces Pain Scale: Hurts even more Pain Location: abdomen/chest area tighness and discomfort with consistent coughing Pain Descriptors / Indicators: Tightness Pain Intervention(s): Monitored during session    Home Living Family/patient expects to be discharged to:: Private residence Living Arrangements: Spouse/significant other (fiance) Available Help at Discharge: Family;Available PRN/intermittently Type of Home: Apartment Home Access: Stairs to enter   Entrance Stairs-Number of Steps: 15   Home Equipment: Walker - 2 wheels Additional Comments: Home set-up and prior level of function information hindered by communication difficulties as pt with consistent coughing and raspy voice making him difficult to understand.    Prior Function  Comments: prior level of function unclear; known that he used RW for mobility     Hand Dominance        Extremity/Trunk Assessment   Upper Extremity Assessment: Defer to OT  evaluation           Lower Extremity Assessment: Overall WFL for tasks assessed         Communication   Communication: Expressive difficulties (significant coughing and raspy voice difficult to understand)  Cognition Arousal/Alertness: Awake/alert Behavior During Therapy: WFL for tasks assessed/performed (constantly coughing) Overall Cognitive Status: Difficult to assess                      General Comments General comments (skin integrity, edema, etc.): Pt was received after trying to eat breakfast.  He has significant dysphasia and was coughing consistently during session.  SLP has been notified.    Exercises        Assessment/Plan    PT Assessment Patient needs continued PT services  PT Diagnosis Difficulty walking;Abnormality of gait   PT Problem List Decreased activity tolerance;Decreased balance;Decreased mobility;Decreased coordination;Decreased knowledge of use of DME;Decreased safety awareness;Cardiopulmonary status limiting activity  PT Treatment Interventions DME instruction;Gait training;Stair training;Functional mobility training;Therapeutic activities;Therapeutic exercise;Balance training;Neuromuscular re-education;Patient/family education   PT Goals (Current goals can be found in the Care Plan section) Acute Rehab PT Goals Patient Stated Goal: none stated PT Goal Formulation: With patient Time For Goal Achievement: 05/03/15 Potential to Achieve Goals: Good    Frequency Min 3X/week   Barriers to discharge Inaccessible home environment (15 stairs to enter)      Co-evaluation               End of Session Equipment Utilized During Treatment: Gait belt Activity Tolerance: Treatment limited secondary to medical complications (Comment) (coughing) Patient left: in bed;with call bell/phone within reach Nurse Communication: Mobility status         Time: 0821-0838 PT Time Calculation (min) (ACUTE ONLY): 17 min   Charges:   PT  Evaluation $Initial PT Evaluation Tier I: 1 Procedure     PT G Codes:   PT G-Codes **NOT FOR INPATIENT CLASS** Functional Assessment Tool Used: clinical judgement Functional Limitation: Mobility: Walking and moving around Mobility: Walking and Moving Around Current Status ([id number](G8978): At least 1 percent but less than 20 percent impaired, limited or restricted Mobility: Walking and Moving Around Goal Status [phone](G8979): 0 percent impaired, limited or restricted    Arnoldo MoraleAmanda Ashkan Chamberland 04/19/2015, 9:01 AM  Arnoldo MoraleAmanda Lailanie Hasley, SPT

## 2015-04-20 ENCOUNTER — Encounter (HOSPITAL_COMMUNITY)
Admission: EM | Disposition: A | Discharge status: Skilled Nursing Facility | Payer: Self-pay | Source: Home / Self Care | Attending: Internal Medicine

## 2015-04-20 ENCOUNTER — Observation Stay (HOSPITAL_COMMUNITY): Payer: Medicare HMO

## 2015-04-20 DIAGNOSIS — R1310 Dysphagia, unspecified: Secondary | ICD-10-CM | POA: Insufficient documentation

## 2015-04-20 DIAGNOSIS — I10 Essential (primary) hypertension: Secondary | ICD-10-CM | POA: Diagnosis not present

## 2015-04-20 DIAGNOSIS — I5043 Acute on chronic combined systolic (congestive) and diastolic (congestive) heart failure: Secondary | ICD-10-CM | POA: Diagnosis not present

## 2015-04-20 DIAGNOSIS — N289 Disorder of kidney and ureter, unspecified: Secondary | ICD-10-CM | POA: Diagnosis not present

## 2015-04-20 DIAGNOSIS — Z515 Encounter for palliative care: Secondary | ICD-10-CM | POA: Diagnosis not present

## 2015-04-20 DIAGNOSIS — D649 Anemia, unspecified: Secondary | ICD-10-CM | POA: Diagnosis not present

## 2015-04-20 DIAGNOSIS — T17998D Other foreign object in respiratory tract, part unspecified causing other injury, subsequent encounter: Secondary | ICD-10-CM

## 2015-04-20 LAB — CBC
HCT: 24.5 % — ABNORMAL LOW (ref 39.0–52.0)
Hemoglobin: 7.5 g/dL — ABNORMAL LOW (ref 13.0–17.0)
MCH: 25.5 pg — ABNORMAL LOW (ref 26.0–34.0)
MCHC: 30.6 g/dL (ref 30.0–36.0)
MCV: 83.3 fL (ref 78.0–100.0)
PLATELETS: 302 10*3/uL (ref 150–400)
RBC: 2.94 MIL/uL — ABNORMAL LOW (ref 4.22–5.81)
RDW: 18.3 % — AB (ref 11.5–15.5)
WBC: 5.3 10*3/uL (ref 4.0–10.5)

## 2015-04-20 LAB — BASIC METABOLIC PANEL
Anion gap: 9 (ref 5–15)
BUN: 43 mg/dL — ABNORMAL HIGH (ref 6–20)
CALCIUM: 8.9 mg/dL (ref 8.9–10.3)
CO2: 26 mmol/L (ref 22–32)
CREATININE: 2.24 mg/dL — AB (ref 0.61–1.24)
Chloride: 111 mmol/L (ref 101–111)
GFR calc non Af Amer: 28 mL/min — ABNORMAL LOW (ref >60)
GFR, EST AFRICAN AMERICAN: 32 mL/min — AB (ref >60)
Glucose, Bld: 81 mg/dL (ref 65–99)
Potassium: 4.6 mmol/L (ref 3.5–5.1)
SODIUM: 146 mmol/L — AB (ref 135–145)

## 2015-04-20 LAB — MAGNESIUM: MAGNESIUM: 1.9 mg/dL (ref 1.7–2.4)

## 2015-04-20 SURGERY — IMAGING PROCEDURE, GI TRACT, INTRALUMINAL, VIA CAPSULE
Anesthesia: LOCAL

## 2015-04-20 MED ORDER — HYDRALAZINE HCL 20 MG/ML IJ SOLN
10.0000 mg | Freq: Four times a day (QID) | INTRAMUSCULAR | Status: DC | PRN
  Administered 2015-04-20: 10 mg via INTRAVENOUS
  Filled 2015-04-20: qty 1

## 2015-04-20 NOTE — Progress Notes (Signed)
Notified Dr. Felicie MornMikhali, that pt is currently NPO and has not had his meds this am and if ok to give meds.  MD returned call and instructed to keep pt NPO as he is going to have a peg tube place today.  Will continue to monitor.  Amanda PeaNellie Jalilah Wiltsie, Charity fundraiserN.

## 2015-04-20 NOTE — Consult Note (Signed)
Consultation Note Date: 04/20/2015   Patient Name: Martin Cain  DOB: 03-12-1946  MRN: [id number]  Age / Sex: 69 y.o., male   PCP: Pcp Not In System Referring Physician: Edsel Petrin, DO  Reason for Consultation: Establishing goals of care  Palliative Care Assessment and Plan Summary of Established Goals of Care and Medical Treatment Preferences   Clinical Assessment/Narrative: Pt is a 69 yo man with h/o CHF, EF 25-30%, CKD IV, CAD, ETOH abuse, symptomatic anemia, dysphagia, and high suspicion for Supranuclear Palsy. Pt was sent to ED from cardiologists office after labs showed HGB 6.6. Pt has recently moved from [place] to [place] to live with his daughter , Jim Like. Pt has been seen and treated at [facility] in [place] with similar symptomology and was scheduled for capsule endoscopy on out-patient basis but then he moved to [facility]. He is alert, pleasant. He does occasionally answer inappropriately, appears somnolent at times, and defers to daughter frequently. In addition to anemia, he has been aspirating. Pt has refused a PEG tube in the past and initially on this admission but has since decided to go forward and is scheduled for PEG thru IR 04/23/15. His biggest complaints are pain in his right knee as well as being hungery. Daughter appears well informed. 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 Daughter reports that pt has had hospice in [place] in the past for this condition.  Contacts/Participants in Discussion: Primary Decision Maker: Pt HCPOA: no  Daughter, her SO, Donald. Pt has a son who lives in [place] as well . Daughter states she is pursuing HCPOA. She has been in communication with her brother  Code Status/Advance Care Planning:  DNR  Go forward with PEG. Scheduled thru IR 10/24  Want pt to eat Dysphagia diet, thickened liquids as safely as he can but do verbalize that they  understand the risk of aspiration   Pt qualified for PACE services in the home  Would like to see if hospice in home care an option but do not want to lose PACE services  Symptom Management:   Pain: c/o right knee pain. Cont with tylenol and monitor for need for stronger agent  Palliative Prophylaxis: bowel regimne  Additional Recommendations (Limitations, Scope, Preferences):  Hospice in the home as long as he can still keep PACE services Psycho-social/Spiritual:   Support System: yes  Desire for further Chaplaincy support:no  Prognosis: < 6 months  Discharge Planning:  Home with Hospice if able to keep PACE services. If not, home with home health       Chief Complaint/History of Present Illness: Pt is a 69 yo man admitted from cardiologist's office after lab showed HGB 6.6  Primary Diagnoses  Present on Admission:  . Symptomatic anemia . Acute on chronic combined systolic and diastolic CHF (congestive heart failure) (HCC) . HTN (hypertension), malignant  Palliative Review of Systems: Right knee pain I have reviewed the medical record, interviewed the patient and family, and examined the patient. The following aspects are pertinent.  Past Medical History  Diagnosis Date  . Hypertension   . Hyperlipidemia   . CKD (chronic kidney disease)     a. Probable stage III.  Marland Kitchen CAD (coronary artery disease)     a. CABG 2000 in [place]. b. NSTEMI 04/2013 felt due to malignant hypertension (was instructed to f/u ATL for stress test).  . Gout   . CHF (congestive heart failure) (  HCC)     a. Echo 04/2013: EF 45-50%, mild AS, mild biatrial enlargement, sev LVH.  . Osteoarthritis     R knee  . Anemia    Social History   Social History  . Marital Status: Divorced    Spouse Name: N/A  . Number of Children: 2  . Years of Education: N/A   Social History Main Topics  . Smoking status: Current Every Day Smoker -- 0.50 packs/day for 40 years    Types: Cigarettes  . Smokeless  tobacco: Never Used  . Alcohol Use: Yes     Comment: previously admitted to 1 pint per day. denies use for 1 month.  . Drug Use: No  . Sexual Activity: Not Asked   Other Topics Concern  . None   Social History Narrative   Family History  Problem Relation Age of Onset  . Heart disease      No family history  . Cancer Father   . Hypertension Father    Scheduled Meds: . allopurinol  100 mg Oral Daily  . amLODipine  10 mg Oral Daily  . atorvastatin  80 mg Oral q1800  . budesonide (PULMICORT) nebulizer solution  0.5 mg Nebulization BID  . carvedilol  25 mg Oral BID  . ferrous sulfate  325 mg Oral Q breakfast  . folic acid  1 mg Oral Daily  . furosemide  40 mg Oral Daily  . hydrALAZINE  100 mg Oral Daily  . isosorbide dinitrate  40 mg Oral Daily  . mometasone-formoterol  2 puff Inhalation BID  . multivitamin with minerals  1 tablet Oral Daily  . pantoprazole  40 mg Oral Daily  . sodium chloride  3 mL Intravenous Q12H  . thiamine  100 mg Oral Daily   Or  . thiamine  100 mg Intravenous Daily   Continuous Infusions:  PRN Meds:.acetaminophen, albuterol, hydrALAZINE, ipratropium-albuterol, LORazepam **OR** LORazepam, RESOURCE THICKENUP CLEAR Medications Prior to Admission:  Prior to Admission medications   Medication Sig Start Date End Date Taking? Authorizing Provider  albuterol (PROVENTIL HFA;VENTOLIN HFA) 108 (90 BASE) MCG/ACT inhaler Inhale 2 puffs into the lungs every 6 (six) hours as needed for wheezing or shortness of breath. 06/28/13  Yes Lars MassonKatarina H Nelson, MD  allopurinol (ZYLOPRIM) 100 MG tablet Take 100 mg by mouth daily. 04/11/15  Yes Historical Provider, MD  amLODipine (NORVASC) 10 MG tablet Take 1 tablet (10 mg total) by mouth daily. 06/22/13  Yes Ripudeep Jenna LuoK Rai, MD  atorvastatin (LIPITOR) 80 MG tablet Take 80 mg by mouth daily. 04/11/15  Yes Historical Provider, MD  carvedilol (COREG) 25 MG tablet Take 1 tablet (25 mg total) by mouth 2 (two) times daily. 06/28/13   Yes Lars MassonKatarina H Nelson, MD  Cholecalciferol (VITAMIN D PO) Take 1,000 mg by mouth daily.   Yes Historical Provider, MD  colchicine 0.6 MG tablet Take 0.5 tablets (0.3 mg total) by mouth daily. 05/31/13  Yes Adeline Joselyn Glassman Viyuoh, MD  [employer] ASPIRIN LOW DOSE 81 MG EC tablet Take 81 mg by mouth daily. 04/11/15  Yes Historical Provider, MD  Cyanocobalamin (VITAMIN B-12 PO) Take 1,000 mg by mouth daily.   Yes Historical Provider, MD  ferrous sulfate 325 (65 FE) MG tablet Take 325 mg by mouth every morning.  04/11/15  Yes Historical Provider, MD  Fluticasone-Salmeterol (ADVAIR) 250-50 MCG/DOSE AEPB Inhale 1 puff into the lungs 2 (two) times daily. 06/09/13  Yes Lars MassonKatarina H Nelson, MD  folic acid (FOLVITE) 1 MG tablet Take 1  mg by mouth daily. 04/11/15  Yes Historical Provider, MD  furosemide (LASIX) 40 MG tablet Take 1 tablet (40 mg total) by mouth daily. 04/17/15  Yes Lars Masson, MD  hydrALAZINE (APRESOLINE) 100 MG tablet Take 100 mg by mouth every morning.  04/11/15  Yes Historical Provider, MD  ipratropium-albuterol (DUONEB) 0.5-2.5 (3) MG/3ML SOLN Inhale 3 mLs into the lungs every 6 (six) hours as needed (shortness of breath).  04/11/15  Yes Historical Provider, MD  isosorbide dinitrate (ISOCHRON) 40 MG CR tablet Take 40 mg by mouth daily. 03/01/15  Yes Historical Provider, MD  lisinopril (PRINIVIL,ZESTRIL) 5 MG tablet Take 5 mg by mouth daily. 04/11/15  Yes Historical Provider, MD  nitroGLYCERIN (NITROSTAT) 0.4 MG SL tablet Place 0.4 mg under the tongue every 5 (five) minutes as needed for chest pain.   Yes Historical Provider, MD  omeprazole (PRILOSEC) 40 MG capsule Take 1 capsule (40 mg total) by mouth daily. 05/31/13  Yes Adeline C Viyuoh, MD  pantoprazole (PROTONIX) 40 MG tablet Take 1 tablet (40 mg total) by mouth daily. 06/22/13  Yes Ripudeep Jenna Luo, MD  QVAR 80 MCG/ACT inhaler Inhale 2 puffs into the lungs 2 (two) times daily. 04/11/15  Yes Historical Provider, MD  risperiDONE (RISPERDAL M-TABS) 1 MG  disintegrating tablet Take 1 tablet (1 mg total) by mouth every evening. 05/31/13  Yes Adeline Joselyn Glassman, MD  vitamin C (ASCORBIC ACID) 250 MG tablet Take 250 mg by mouth daily. 04/11/15  Yes Historical Provider, MD  VOLTAREN 1 % GEL Apply 1 application topically 4 (four) times daily as needed (pain). Knee pain 02/28/15  Yes Historical Provider, MD   No Known Allergies CBC:    Component Value Date/Time   WBC 5.3 04/20/2015 0224   HGB 7.5* 04/20/2015 0224   HCT 24.5* 04/20/2015 0224   PLT 302 04/20/2015 0224   MCV 83.3 04/20/2015 0224   NEUTROABS 3.3 04/17/2015 2216   LYMPHSABS 1.7 04/17/2015 2216   MONOABS 0.5 04/17/2015 2216   EOSABS 0.0 04/17/2015 2216   BASOSABS 0.0 04/17/2015 2216   Comprehensive Metabolic Panel:    Component Value Date/Time   NA 146* 04/20/2015 0224   K 4.6 04/20/2015 0224   CL 111 04/20/2015 0224   CO2 26 04/20/2015 0224   BUN 43* 04/20/2015 0224   CREATININE 2.24* 04/20/2015 0224   CREATININE 2.52* 04/17/2015 0928   GLUCOSE 81 04/20/2015 0224   CALCIUM 8.9 04/20/2015 0224   AST 20 04/17/2015 2216   ALT 22 04/17/2015 2216   ALKPHOS 70 04/17/2015 2216   BILITOT 0.3 04/17/2015 2216   PROT 6.6 04/17/2015 2216   ALBUMIN 3.3* 04/17/2015 2216    Physical Exam: Vital Signs: BP 163/75 mmHg  Pulse 81  Temp(Src) 97.5 F (36.4 C) (Oral)  Resp 14  Ht 5\' 9"  (1.753 m)  Wt 62.188 kg (137 lb 1.6 oz)  BMI 20.24 kg/m2  SpO2 93% SpO2: SpO2: 93 % O2 Device: O2 Device: Not Delivered O2 Flow Rate:   Intake/output summary:  Intake/Output Summary (Last 24 hours) at 04/20/15 1609 Last data filed at 04/20/15 1410  Gross per 24 hour  Intake    360 ml  Output    450 ml  Net    -90 ml   LBM: Last BM Date: 04/19/15 Baseline Weight: Weight: 67.586 kg (149 lb) Most recent weight: Weight: 62.188 kg (137 lb 1.6 oz)  Exam Findings:  General: Cachetic man, a/o x 3, no acute distress Resp: Upper airway congestion. No increased  work of breathing. Voice gravelly           Palliative Performance Scale: 40-50%              Additional Data Reviewed: Recent Labs     04/19/15  0247  04/20/15  0224  WBC  6.2  5.3  HGB  7.3*  7.5*  PLT  306  302  NA  144  146*  BUN  46*  43*  CREATININE  2.61*  2.24*     Time In: 1440 Time Out: 1605 Time Total: 85 min Greater than 50%  of this time was spent counseling and coordinating care related to the above assessment and plan. Staffed with Dr. Catha Gosselin  Signed by: Irean Hong, NP  Irean Hong, NP  04/20/2015, 4:09 PM  Please contact Palliative Medicine Team phone at [phone] for questions and concerns.

## 2015-04-20 NOTE — Progress Notes (Signed)
CMT notified nurse during shift change of patient having 13 beats of V-tach. Patient was resting in bed and states no signs or symptoms of discomfort. Notified Dr. Catha GosselinMikhail of occurrence of V-tach. EKG was done. Notified and reported off to day nurse. Nurse signing off at this time and day nurse will continue to monitor patient to end of shift.

## 2015-04-20 NOTE — Progress Notes (Signed)
SLP Cancellation Note  Patient Details Name: Read DriversFrank Nie MRN: [id number] DOB: 01/14/1946   Cancelled treatment:         NPO and receiving PEG today. Will continue to follow  Royce MacadamiaLitaker, Raimi Guillermo Willis 04/20/2015, 4:06 PM   Breck CoonsLisa Willis Lonell FaceLitaker M.Ed [employer]-SLP Pager [phone]

## 2015-04-20 NOTE — Progress Notes (Signed)
Pt's BP 171/64.  Dr Catha GosselinMikhail informed and instructed she will order some BP med via iv.  Will continue to monitor.  Amanda PeaNellie Belynda Pagaduan, Charity fundraiserN.

## 2015-04-20 NOTE — Progress Notes (Signed)
Patient ID: Martin Cain, male   DOB: 06/13/1946, 69 y.o.   MRN: [id number]   IR aware of G tube request Dysphagia Wt loss Aspiration  Need CT Abd to evaluate anatomy for candidacy for percutaneous placement Ordered and awaiting result

## 2015-04-20 NOTE — Consult Note (Signed)
Chief Complaint: Patient was seen in consultation today for percutaneous gastric tube placement Chief Complaint  Patient presents with  . Anemia   at the request of [facility]  Referring Physician(s): Dr Catha GosselinMikhail  History of Present Illness: Read DriversFrank Stepp is a 69 y.o. male   Pt with hx progressive supranuclear palsy per chart---[facility] Dysphagia Aspiration Wt loss Malnutrition Swallow study: Dys 2 per Speech Admitted with anemia through ED ETOH abuse [facility] CostainShick has reviewed imaging and approves procedure   Past Medical History  Diagnosis Date  . Hypertension   . Hyperlipidemia   . CKD (chronic kidney disease)     a. Probable stage III.  Marland Kitchen. CAD (coronary artery disease)     a. [place] 2000 [place]. b. NSTEMI 04/2013 felt due to malignant hypertension (was instructed to f/u [facility] for stress test).  . Gout   . CHF (congestive heart failure) (HCC)     a. Echo 04/2013: EF 45-50%, mild AS, mild biatrial enlargement, sev LVH.  . Osteoarthritis     R knee  . Anemia     Past Surgical History  Procedure Laterality Date  . Cardiac surgery      2000; [place]  . Appendectomy    . Abdominal exploration surgery    . Tonsillectomy      Allergies: Review of patient's allergies indicates no known allergies.  Medications: Prior to Admission medications   Medication Sig Start Date End Date Taking? Authorizing Provider  albuterol (PROVENTIL HFA;VENTOLIN HFA) 108 (90 BASE) MCG/ACT inhaler Inhale 2 puffs into the lungs every 6 (six) hours as needed for wheezing or shortness of breath. 06/28/13  Yes Lars MassonKatarina H Nelson, MD  allopurinol (ZYLOPRIM) 100 MG tablet Take 100 mg by mouth daily. 04/11/15  Yes Historical Provider, MD  amLODipine (NORVASC) 10 MG tablet Take 1 tablet (10 mg total) by mouth daily. 06/22/13  Yes Ripudeep Jenna LuoK Rai, MD  atorvastatin (LIPITOR) 80 MG tablet Take 80 mg by mouth daily. 04/11/15  Yes  Historical Provider, MD  carvedilol (COREG) 25 MG tablet Take 1 tablet (25 mg total) by mouth 2 (two) times daily. 06/28/13  Yes Lars MassonKatarina H Nelson, MD  Cholecalciferol (VITAMIN D PO) Take 1,000 mg by mouth daily.   Yes Historical Provider, MD  colchicine 0.6 MG tablet Take 0.5 tablets (0.3 mg total) by mouth daily. 05/31/13  Yes Adeline Joselyn Glassman Viyuoh, MD  [facility] ASPIRIN LOW DOSE 81 MG EC tablet Take 81 mg by mouth daily. 04/11/15  Yes Historical Provider, MD  Cyanocobalamin (VITAMIN B-12 PO) Take 1,000 mg by mouth daily.   Yes Historical Provider, MD  ferrous sulfate 325 (65 FE) MG tablet Take 325 mg by mouth every morning.  04/11/15  Yes Historical Provider, MD  Fluticasone-Salmeterol (ADVAIR) 250-50 MCG/DOSE AEPB Inhale 1 puff into the lungs 2 (two) times daily. 06/09/13  Yes Lars MassonKatarina H Nelson, MD  folic acid (FOLVITE) 1 MG tablet Take 1 mg by mouth daily. 04/11/15  Yes Historical Provider, MD  furosemide (LASIX) 40 MG tablet Take 1 tablet (40 mg total) by mouth daily. 04/17/15  Yes Lars MassonKatarina H Nelson, MD  hydrALAZINE (APRESOLINE) 100 MG tablet Take 100 mg by mouth every morning.  04/11/15  Yes Historical Provider, MD  ipratropium-albuterol (DUONEB) 0.5-2.5 (3) MG/3ML SOLN Inhale 3 mLs into the lungs every 6 (six) hours as needed (shortness of breath).  04/11/15  Yes Historical Provider, MD  isosorbide dinitrate (ISOCHRON) 40 MG CR  tablet Take 40 mg by mouth daily. 03/01/15  Yes Historical Provider, MD  lisinopril (PRINIVIL,ZESTRIL) 5 MG tablet Take 5 mg by mouth daily. 04/11/15  Yes Historical Provider, MD  nitroGLYCERIN (NITROSTAT) 0.4 MG SL tablet Place 0.4 mg under the tongue every 5 (five) minutes as needed for chest pain.   Yes Historical Provider, MD  omeprazole (PRILOSEC) 40 MG capsule Take 1 capsule (40 mg total) by mouth daily. 05/31/13  Yes Adeline C Viyuoh, MD  pantoprazole (PROTONIX) 40 MG tablet Take 1 tablet (40 mg total) by mouth daily. 06/22/13  Yes Ripudeep Jenna Luo, MD  QVAR 80 MCG/ACT inhaler  Inhale 2 puffs into the lungs 2 (two) times daily. 04/11/15  Yes Historical Provider, MD  risperiDONE (RISPERDAL M-TABS) 1 MG disintegrating tablet Take 1 tablet (1 mg total) by mouth every evening. 05/31/13  Yes Adeline Joselyn Glassman, MD  vitamin C (ASCORBIC ACID) 250 MG tablet Take 250 mg by mouth daily. 04/11/15  Yes Historical Provider, MD  VOLTAREN 1 % GEL Apply 1 application topically 4 (four) times daily as needed (pain). Knee pain 02/28/15  Yes Historical Provider, MD     Family History  Problem Relation Age of Onset  . Heart disease      No family history  . Cancer Father   . Hypertension Father     Social History   Social History  . Marital Status: Divorced    Spouse Name: N/A  . Number of Children: 2  . Years of Education: N/A   Social History Main Topics  . Smoking status: Current Every Day Smoker -- 0.50 packs/day for 40 years    Types: Cigarettes  . Smokeless tobacco: Never Used  . Alcohol Use: Yes     Comment: previously admitted to 1 pint per day. denies use for 1 month.  . Drug Use: No  . Sexual Activity: Not Asked   Other Topics Concern  . None   Social History Narrative    Review of Systems: A 12 point ROS discussed and pertinent positives are indicated in the HPI above.  All other systems are negative.  Review of Systems  Constitutional: Positive for activity change, appetite change, fatigue and unexpected weight change. Negative for fever.  HENT: Positive for trouble swallowing and voice change.   Respiratory: Negative for shortness of breath.   Cardiovascular: Negative for chest pain.  Gastrointestinal: Negative for abdominal pain.  Neurological: Positive for weakness. Negative for speech difficulty.  Psychiatric/Behavioral: Negative for behavioral problems, confusion and agitation.    Vital Signs: BP 164/68 mmHg  Pulse 79  Temp(Src) 97.5 F (36.4 C) (Oral)  Resp 14  Ht  (1.753 m)  Wt 137 lb 1.6 oz (62.188 kg)  BMI 20.24 kg/m2  SpO2  93%  Physical Exam  Constitutional: He is oriented to person, place, and time.  Cardiovascular: Normal rate and regular rhythm.   Pulmonary/Chest: Effort normal and breath sounds normal.  Abdominal: Soft. Bowel sounds are normal.  Musculoskeletal: Normal range of motion.  Neurological: He is alert and oriented to person, place, and time.  Groggy Answers all questions correctly Aware of name; dob; place; date; location  Skin: Skin is warm and dry.  Psychiatric: He has a normal mood and affect. His behavior is normal. Judgment and thought content normal.  Nursing note and vitals reviewed.   Mallampati Score:  MD Evaluation Airway: WNL Heart: WNL Abdomen: WNL Chest/ Lungs: WNL ASA  Classification: 3 Mallampati/Airway Score: One  Imaging: Ct Abdomen Wo  Contrast  04/20/2015  CLINICAL DATA:  Evaluate for percutaneous gastrostomy tube placement. EXAM: CT ABDOMEN WITHOUT CONTRAST TECHNIQUE: Multidetector CT imaging of the abdomen was performed following the standard protocol without IV contrast. COMPARISON:  None. FINDINGS: Lower chest: The heart is markedly enlarged. No pericardial effusion. Coronary artery calcifications are noted. Advanced aortic calcifications without focal aneurysm. The distal esophagus is grossly normal. There are small bilateral pleural effusions and overlying atelectasis. Hepatobiliary: No obvious hepatic lesions or intrahepatic biliary dilatation. The gallbladder is grossly normal. No common bile duct dilatation. Pancreas: No mass, inflammation or ductal dilatation. Spleen: Small spleen.  No focal lesions. Adrenals/Urinary Tract: Numerous bilateral renal cysts and extensive renal artery calcifications. No hydronephrosis. Stomach/Bowel: The stomach, duodenum, visualized small bowel and visualized colon are grossly normal without oral contrast. There is no interposing of the transverse colon between the anterior wall of the stomach and the anterior abdominal wall. The  transverse colon is just below the stomach. Vascular/Lymphatic: Advanced atherosclerotic calcifications involving the aorta and branch vessels. Maximum aortic dimension is 29.5 mm. No obvious mesenteric or retroperitoneal mass or adenopathy. Scattered lymph nodes are noted. Other: No ascites or abdominal wall hernia Musculoskeletal: No significant osseous findings. IMPRESSION: 1. Small bilateral pleural effusions and overlying atelectasis. 2. Cardiac enlargement. 3. Advanced atherosclerotic calcifications involving the aorta and branch vessels. 4. Numerous bilateral renal cysts. 5. The transverse colon is not interposed between the anterior wall of the stomach and the anterior abdominal wall. Electronically Signed   By: Rudie Meyer M.D.   On: 04/20/2015 11:25    Labs:  CBC:  Recent Labs  04/18/15 0210 04/18/15 2235 04/19/15 0247 04/20/15 0224  WBC 5.2 6.1 6.2 5.3  HGB 6.4* 7.1* 7.3* 7.5*  HCT 21.2* 23.1* 23.2* 24.5*  PLT 307 301 306 302    COAGS:  Recent Labs  04/17/15 2216  INR 1.26  APTT 38*    BMP:  Recent Labs  04/17/15 2216 04/18/15 0210 04/19/15 0247 04/20/15 0224  NA 144 145 144 146*  K 4.2 4.3 4.3 4.6  CL 106 111 112* 111  CO2 26 27 26 26   GLUCOSE 113* 101* 89 81  BUN 49* 48* 46* 43*  CALCIUM 8.8* 8.5* 8.4* 8.9  CREATININE 2.77* 2.71* 2.61* 2.24*  GFRNONAA 22* 22* 23* 28*  GFRAA 25* 26* 27* 32*    LIVER FUNCTION TESTS:  Recent Labs  04/17/15 0928 04/17/15 2216  BILITOT 0.3 0.3  AST 20 20  ALT 22 22  ALKPHOS 65 70  PROT 6.4 6.6  ALBUMIN 3.5* 3.3*    TUMOR MARKERS: No results for input(s): AFPTM, CEA, CA199, CHROMGRNA in the last 8760 hours.  Assessment and Plan:  Dysphagia Wt loss Aspiration Scheduled for perc G tube in IR 10/24 Risks and Benefits discussed with the patient including, but not limited to the need for a barium enema during the procedure, bleeding, infection, peritonitis, or damage to adjacent structures. All of the  patient's questions were answered, patient is agreeable to proceed. Consent signed and in chart.  Attempted to call dtr to be sure all were aware of procedure--NA. Will continue to try or see her maybe over weekend. Pt is able to consent for self---just want to make dtr aware and answer any questions  Thank you for this interesting consult.  I greatly enjoyed meeting Yuri Fana and look forward to participating in their care.  A copy of this report was sent to the requesting provider on this date.  Signed: Ralene Muskrat A  04/20/2015, 2:12 PM   I spent a total of 40 Minutes    in face to face in clinical consultation, greater than 50% of which was counseling/coordinating care for percutaneous gastric tube placement

## 2015-04-20 NOTE — Progress Notes (Addendum)
[facility]                                                                              Patient Demographics  Martin Cain, is a 69 y.o. male, DOB - 08/19/1945, ZOX:[id number]  Admit date - 04/17/2015   Admitting Physician Martin BowJared M Gardner, DO  Outpatient Primary MD for the patient is Pcp Not In System  LOS -    Chief Complaint  Patient presents with  . Anemia      HPI on 04/17/2015 by Dr. Lyda PeroneJared Cain Martin Cain is a 69 y.o. male with h/o CHF (EF 42% in 2014), CAD s/p CABG in 2000, CKD stage 3, ongoing EtOH abuse, medication non-compliance. He has recently been [facility] [place] [facility] for [facility] and also anemia with HGB of 5.7. Obtaining the formal records is pending but it is known that during that admit he had a normal EGD on 03/30/15, normal colonoscopy a year ago. He was transfused PRBC units and discharged with plan for outpatient capsule endoscopy. Before this could be completed however, [place] [facility]. Today he was seen at the cardiologists office here in town for follow up. Labs drawn at the cardiologists office came back showing HGB of 6.6. Patient was sent into the ED.  Assessment & Plan   Symptomatic anemia -Hemoglobin upon admission 6.4, received 2uPRBCs, current hemoglobin 7.5 -Continue to monitor hemoglobin -FOBT negative -Patient was recently admitted [facility] [place] for anemia, was supposed to have capsule endoscopy as an outpatient however [place] moved to [place] -03/30/2015 EGD showed atypical mucosa suggesting benign Brunner's gland nodule -Gastroenterology consulted and appreciated, possible capsule endoscopy   Acute on chronic combined systolic and diastolic CHF -Continue to monitor daily weights, intake and output -EF 2014 was 42%, According to [facility], last EF 12/2014: 10-20% on TTE, TEE <20% -Echocardiogram EF 25-30% -Continue lasix, monitor daily weights, I/Os  Coronary artery  disease -Currently stable, no complaints of chest pain -Status post CABG -Continue aspirin, Coreg, statin -ACEi held  Chronic kidney disease, stage 4 -review patient's records, baseline creatinine approximately 2.5, current Cr 2.24 -Continue to monitor BMP  Hypertension -Continue Lasix, hydralazine,isochron, Coreg, amlodipine  Dysphagia with aspiration -Patient has a 40lb weight loss.  Speech therapy consult placed patient on dysphagia 2 diet. Patient continues to have moments of aspiration. Per [place] of [place] [facility], suspicion of PSP (discharge summary 09/16/2014) -Spoke with neurology, needs outpatient follow up.  Patient will need therapy, but unfortunately no inpatient solution. -Spoke with patient regarding peg tube, he now agrees to peg placement -IR consulted and appreciated  Goals of care -Have consulted palliative care to discuss goals with patient and daughter given patient a couple comorbidities. -Patient is now DNR  Code Status: Full  Family Communication: None at bedside  Disposition Plan: Admitted, pending peg placement, continue to monitor hemoglobin  Time Spent in minutes   30 minutes  Procedures  Echocardiogram  Consults   Gastroenterology Neurology via phone Interventional radiology  DVT Prophylaxis  SCDs  Lab Results  Component Value Date   PLT 302 04/20/2015    Medications  Scheduled Meds: . allopurinol  100 mg Oral Daily  . amLODipine  10 mg Oral Daily  . atorvastatin  80 mg Oral q1800  . budesonide (PULMICORT) nebulizer solution  0.5 mg Nebulization BID  . carvedilol  25 mg Oral BID  . ferrous sulfate  325 mg Oral Q breakfast  . folic acid  1 mg Oral Daily  . furosemide  40 mg Oral Daily  . hydrALAZINE  100 mg Oral Daily  . isosorbide dinitrate  40 mg Oral Daily  . mometasone-formoterol  2 puff Inhalation BID  . multivitamin with minerals  1 tablet Oral Daily  . pantoprazole  40 mg Oral Daily  . sodium chloride  3 mL  Intravenous Q12H  . thiamine  100 mg Oral Daily   Or  . thiamine  100 mg Intravenous Daily   Continuous Infusions:  PRN Meds:.acetaminophen, albuterol, ipratropium-albuterol, LORazepam **OR** LORazepam, RESOURCE THICKENUP CLEAR  Antibiotics    Anti-infectives    None     Subjective:   Martin Drivers seen and examined today.  Patient states he feels better today.  Currently denies any chest pain, shortness of breath, dizziness or headache, abdominal pain, nausea or vomiting.  Has agreed to peg tube placement.  Objective:   Filed Vitals:   04/19/15 2016 04/20/15 0359 04/20/15 0818 04/20/15 0837  BP: 157/69 160/68  151/63  Pulse: 82 81  79  Temp: 98.6 F (37 C) 98.1 F (36.7 C)    TempSrc: Oral Oral    Resp: 18 16    Height:      Weight:  62.188 kg (137 lb 1.6 oz)    SpO2: 98% 96% 97%     Wt Readings from Last 3 Encounters:  04/20/15 62.188 kg (137 lb 1.6 oz)  04/17/15 67.405 kg (148 lb 9.6 oz)  06/28/13 56.246 kg (124 lb)     Intake/Output Summary (Last 24 hours) at 04/20/15 1047 Last data filed at 04/20/15 0857  Gross per 24 hour  Intake    360 ml  Output    450 ml  Net    -90 ml    Exam  General: Well developed, thin, NAD  HEENT: NCAT,  mucous membranes moist.   Cardiovascular: S1 S2 auscultated, RRR  Respiratory: Clear to auscultation bilaterally  Abdomen: Soft, nontender, nondistended, + bowel sounds  Extremities: warm dry without cyanosis clubbing or edema  Neuro: AAOx3, nonfocal  Psych: Appropriate mood and affect, pleasant  Data Review   Micro Results No results found for this or any previous visit (from the past 240 hour(s)).  Radiology Reports No results found.  CBC  Recent Labs Lab 04/17/15 0928 04/17/15 2216 04/18/15 0210 04/18/15 2235 04/19/15 0247 04/20/15 0224  WBC 5.1 5.5 5.2 6.1 6.2 5.3  HGB 6.6* 6.7* 6.4* 7.1* 7.3* 7.5*  HCT 22.0* 22.0* 21.2* 23.1* 23.2* 24.5*  PLT 325 325 307 301 306 302  MCV 84.9 83.3 82.8 83.1  82.9 83.3  MCH 25.5* 25.4* 25.0* 25.5* 26.1 25.5*  MCHC 30.0 30.5 30.2 30.7 31.5 30.6  RDW 19.3* 19.0* 18.9* 18.1* 17.9* 18.3*  LYMPHSABS 1.6 1.7  --   --   --   --   MONOABS 0.4 0.5  --   --   --   --   EOSABS 0.1 0.0  --   --   --   --   BASOSABS 0.0 0.0  --   --   --   --     Chemistries   Recent Labs Lab 04/17/15 [phone]  04/17/15 2216 04/18/15 0210 04/19/15 0247 04/20/15 0224 04/20/15 1011  NA 144 144 145 144 146*  --   K 4.6 4.2 4.3 4.3 4.6  --   CL 111* 106 111 112* 111  --   CO2 --   GLUCOSE 88 113* 101* 89 81  --   BUN 48* 49* 48* 46* 43*  --   CREATININE 2.52* 2.77* 2.71* 2.61* 2.24*  --   CALCIUM 8.7 8.8* 8.5* 8.4* 8.9  --   MG  --   --   --   --   --  1.9  AST 20 20  --   --   --   --   ALT 22 22  --   --   --   --   ALKPHOS 65 70  --   --   --   --   BILITOT 0.3 0.3  --   --   --   --    ------------------------------------------------------------------------------------------------------------------ estimated creatinine clearance is 27 mL/min (by C-G formula based on Cr of 2.24). ------------------------------------------------------------------------------------------------------------------ No results for input(s): HGBA1C in the last 72 hours. ------------------------------------------------------------------------------------------------------------------ No results for input(s): CHOL, HDL, LDLCALC, TRIG, CHOLHDL, LDLDIRECT in the last 72 hours. ------------------------------------------------------------------------------------------------------------------ No results for input(s): TSH, T4TOTAL, T3FREE, THYROIDAB in the last 72 hours.  Invalid input(s): FREET3 ------------------------------------------------------------------------------------------------------------------ No results for input(s): VITAMINB12, FOLATE, FERRITIN, TIBC, IRON, RETICCTPCT in the last 72 hours.  Coagulation profile  Recent Labs Lab 04/17/15 2216  INR 1.26     No results for input(s): DDIMER in the last 72 hours.  Cardiac Enzymes No results for input(s): CKMB, TROPONINI, MYOGLOBIN in the last 168 hours.  Invalid input(s): CK ------------------------------------------------------------------------------------------------------------------ Invalid input(s): POCBNP    Yarah Fuente D.O. on 04/20/2015 at 10:47 AM  Between 7am to 7pm - Pager - [phone]  After 7pm go to [email or web address] - password [email or web address]  And look for the night coverage person covering for me after hours  [facility] Office  [phone]

## 2015-04-21 DIAGNOSIS — Z6820 Body mass index (BMI) 20.0-20.9, adult: Secondary | ICD-10-CM | POA: Diagnosis not present

## 2015-04-21 DIAGNOSIS — I10 Essential (primary) hypertension: Secondary | ICD-10-CM | POA: Diagnosis present

## 2015-04-21 DIAGNOSIS — E46 Unspecified protein-calorie malnutrition: Secondary | ICD-10-CM | POA: Diagnosis present

## 2015-04-21 DIAGNOSIS — R5381 Other malaise: Secondary | ICD-10-CM | POA: Diagnosis not present

## 2015-04-21 DIAGNOSIS — I2510 Atherosclerotic heart disease of native coronary artery without angina pectoris: Secondary | ICD-10-CM | POA: Diagnosis present

## 2015-04-21 DIAGNOSIS — M109 Gout, unspecified: Secondary | ICD-10-CM | POA: Diagnosis present

## 2015-04-21 DIAGNOSIS — D649 Anemia, unspecified: Secondary | ICD-10-CM | POA: Diagnosis present

## 2015-04-21 DIAGNOSIS — Z7982 Long term (current) use of aspirin: Secondary | ICD-10-CM | POA: Diagnosis not present

## 2015-04-21 DIAGNOSIS — Z515 Encounter for palliative care: Secondary | ICD-10-CM | POA: Diagnosis not present

## 2015-04-21 DIAGNOSIS — I130 Hypertensive heart and chronic kidney disease with heart failure and stage 1 through stage 4 chronic kidney disease, or unspecified chronic kidney disease: Secondary | ICD-10-CM | POA: Diagnosis present

## 2015-04-21 DIAGNOSIS — G231 Progressive supranuclear ophthalmoplegia [Steele-Richardson-Olszewski]: Secondary | ICD-10-CM | POA: Diagnosis present

## 2015-04-21 DIAGNOSIS — F17210 Nicotine dependence, cigarettes, uncomplicated: Secondary | ICD-10-CM | POA: Diagnosis present

## 2015-04-21 DIAGNOSIS — Z9114 Patient's other noncompliance with medication regimen: Secondary | ICD-10-CM | POA: Diagnosis not present

## 2015-04-21 DIAGNOSIS — Z951 Presence of aortocoronary bypass graft: Secondary | ICD-10-CM | POA: Diagnosis not present

## 2015-04-21 DIAGNOSIS — E785 Hyperlipidemia, unspecified: Secondary | ICD-10-CM | POA: Diagnosis present

## 2015-04-21 DIAGNOSIS — I5043 Acute on chronic combined systolic (congestive) and diastolic (congestive) heart failure: Secondary | ICD-10-CM | POA: Diagnosis present

## 2015-04-21 DIAGNOSIS — F1010 Alcohol abuse, uncomplicated: Secondary | ICD-10-CM | POA: Diagnosis present

## 2015-04-21 DIAGNOSIS — I252 Old myocardial infarction: Secondary | ICD-10-CM | POA: Diagnosis not present

## 2015-04-21 DIAGNOSIS — Z66 Do not resuscitate: Secondary | ICD-10-CM | POA: Diagnosis not present

## 2015-04-21 DIAGNOSIS — R1310 Dysphagia, unspecified: Secondary | ICD-10-CM | POA: Diagnosis present

## 2015-04-21 DIAGNOSIS — Z79899 Other long term (current) drug therapy: Secondary | ICD-10-CM | POA: Diagnosis not present

## 2015-04-21 DIAGNOSIS — Z8249 Family history of ischemic heart disease and other diseases of the circulatory system: Secondary | ICD-10-CM | POA: Diagnosis not present

## 2015-04-21 DIAGNOSIS — Z7951 Long term (current) use of inhaled steroids: Secondary | ICD-10-CM | POA: Diagnosis not present

## 2015-04-21 DIAGNOSIS — N184 Chronic kidney disease, stage 4 (severe): Secondary | ICD-10-CM | POA: Diagnosis present

## 2015-04-21 DIAGNOSIS — Z809 Family history of malignant neoplasm, unspecified: Secondary | ICD-10-CM | POA: Diagnosis not present

## 2015-04-21 LAB — BASIC METABOLIC PANEL
ANION GAP: 6 (ref 5–15)
BUN: 40 mg/dL — AB (ref 6–20)
CALCIUM: 8.6 mg/dL — AB (ref 8.9–10.3)
CO2: 28 mmol/L (ref 22–32)
CREATININE: 2.24 mg/dL — AB (ref 0.61–1.24)
Chloride: 107 mmol/L (ref 101–111)
GFR calc Af Amer: 32 mL/min — ABNORMAL LOW (ref >60)
GFR calc non Af Amer: 28 mL/min — ABNORMAL LOW (ref >60)
GLUCOSE: 98 mg/dL (ref 65–99)
Potassium: 4.5 mmol/L (ref 3.5–5.1)
Sodium: 141 mmol/L (ref 135–145)

## 2015-04-21 LAB — CBC
HEMATOCRIT: 27.5 % — AB (ref 39.0–52.0)
Hemoglobin: 8.5 g/dL — ABNORMAL LOW (ref 13.0–17.0)
MCH: 25.8 pg — ABNORMAL LOW (ref 26.0–34.0)
MCHC: 30.9 g/dL (ref 30.0–36.0)
MCV: 83.6 fL (ref 78.0–100.0)
Platelets: 300 10*3/uL (ref 150–400)
RBC: 3.29 MIL/uL — ABNORMAL LOW (ref 4.22–5.81)
RDW: 18.3 % — AB (ref 11.5–15.5)
WBC: 5.9 10*3/uL (ref 4.0–10.5)

## 2015-04-21 MED ORDER — FUROSEMIDE 10 MG/ML IJ SOLN
40.0000 mg | Freq: Every day | INTRAMUSCULAR | Status: DC
  Administered 2015-04-22 – 2015-04-25 (×4): 40 mg via INTRAVENOUS
  Filled 2015-04-21 (×3): qty 4

## 2015-04-21 MED ORDER — ISOSORBIDE DINITRATE ER 40 MG PO CPCR
40.0000 mg | ORAL_CAPSULE | Freq: Every day | ORAL | Status: DC
  Administered 2015-04-21 – 2015-04-25 (×4): 40 mg via ORAL
  Filled 2015-04-21 (×5): qty 1

## 2015-04-21 MED ORDER — FUROSEMIDE 10 MG/ML IJ SOLN
20.0000 mg | Freq: Every day | INTRAMUSCULAR | Status: DC
  Filled 2015-04-21: qty 2

## 2015-04-21 NOTE — Care Management Note (Addendum)
Case Management Note  Patient Details  Name: Martin Cain MRN: [id number] Date of Birth: 11/24/1945  Subjective/Objective:                   Anemia Action/Plan: Discharge planning  Expected Discharge Date:                  Expected Discharge Plan:  Home w Home Health Services  In-House Referral:     Discharge planning Services  CM Consult  Post Acute Care Choice:    Choice offered to:  Adult Children  DME Arranged:  3-N-1, Wheelchair manual, Hospital bed DME Agency:  [facility]  HH Arranged:  RN, Disease Management, Nurse's Aide HH Agency:  [facility]  Status of Service:  In process, will continue to follow  Medicare Important Message Given:    Date Medicare IM Given:    Medicare IM give by:    Date Additional Medicare IM Given:    Additional Medicare Important Message give by:     If discussed at Long Length of Stay Meetings, dates discussed:    Additional Comments: CM noted palliative consult and called [facility] rep, [name] who states pt is NOT active at this time but has been assessed and [facility] care begins for pt's the 1st of each month so pt is looking at a November 1 start date if accepted by [facility]; according to [facility] rep, [name], the pt will not be eligible for both [facility] support and hospice Home support.  CM called daughter, [name] [phone]; pt has moved in with [name] who lives at [address] [place], [place] [phone] and [name] states family cannot give up the support of [facility] and requests [facility] services.  Pt agrees.  [facility]  CM has requested HH orders and DME orders for hospital bed, wheelchair, and 3n1. CM called [facility] rep, [name] with tentative referral and [name] will follow.   CM will continue to follow for progress. [name], [name], RN 04/21/2015, 12:24 PM

## 2015-04-21 NOTE — Progress Notes (Signed)
[facility]                                                                              Patient Demographics  [name], is a 69 y.o. male, DOB - 11/29/1945, [name]  Admit date - 04/17/2015   Admitting Physician Martin BowJared M Gardner, DO  Outpatient Primary MD for the patient is Pcp Not In System  LOS -    Chief Complaint  Patient presents with  . Anemia      HPI on 04/17/2015 by Dr. Lyda PeroneJared Cain [name] is a 69 y.o. male with h/o CHF (EF 42% in 2014), CAD s/p CABG in 2000, CKD stage 3, ongoing EtOH abuse, medication non-compliance. He has recently been hospitalized in [place] at [facility] for CHF and also anemia with HGB of 5.7. Obtaining the formal records is pending but it is known that during that admit he had a normal EGD on 03/30/15, normal colonoscopy a year ago. He was transfused PRBC units and discharged with plan for outpatient capsule endoscopy. Before this could be completed however, [facility] [place] [facility] Today he was seen at the cardiologists office here in town for follow up. Labs drawn at the cardiologists office came back showing HGB of 6.6. Patient was sent into the ED.  Assessment & Plan   Symptomatic anemia -Hemoglobin upon admission 6.4, received 2uPRBCs, current hemoglobin 8.5 -Continue to monitor hemoglobin -FOBT negative -Patient was recently admitted to [facility] [place] for anemia, was supposed to have capsule endoscopy as an outpatient however [place] -03/30/2015 EGD showed atypical mucosa suggesting benign Brunner's gland nodule -Gastroenterology consulted and appreciated, possible capsule endoscopy   Acute on chronic combined systolic and diastolic CHF -Continue to monitor daily weights, intake and output -EF 2014 was 42%, According to records from [facility], last EF 12/2014: 10-20% on TTE, TEE <20% -Echocardiogram EF 25-30% -Continue lasix (will change to IV form), monitor daily weights,  I/Os -Not on ACEi due to CKD stage 4  Coronary artery disease -Currently stable, no complaints of chest pain -Status post CABG -Continue aspirin, Coreg, statin -ACEi held  Chronic kidney disease, stage 4 -review patient's records, baseline creatinine approximately 2.5, current Cr 2.24 -Continue to monitor BMP  Hypertension -Continue Lasix, hydralazine,isochron, Coreg, amlodipine -Hydralazine IV PRN  Dysphagia with aspiration -Patient has a 40lb weight loss.  Speech therapy consult placed patient on dysphagia 2 diet. Patient continues to have moments of aspiration. Per review of records from [place] [facility], suspicion of PSP (discharge summary 09/16/2014) -Spoke with neurology, needs outpatient follow up.  Patient will need therapy, but unfortunately no inpatient solution. -Spoke with patient regarding peg tube, he now agrees to peg placement -IR consulted and appreciated- peg to be placed on Monday -Patient still requires hospitalization as he is unable to take his meds or keep in nutrition -Have placed him on a dysphagia 2 diet with nectar thick liquids, however, amount of food is minimal.  Cannot place on IVF due to acute CHF. Patient understands the risk of aspiration.   Goals of care -Have consulted palliative care to discuss goals with patient and daughter given patient a couple comorbidities. -Patient is now  DNR  Code Status: Full  Family Communication: None at bedside  Disposition Plan: Admitted, pending peg placement, continue to monitor hemoglobin  Time Spent in minutes   30 minutes  Procedures  Echocardiogram  Consults   Gastroenterology Neurology via phone Interventional radiology Palliative care  DVT Prophylaxis  SCDs  Lab Results  Component Value Date   PLT 300 04/21/2015    Medications  Scheduled Meds: . allopurinol  100 mg Oral Daily  . amLODipine  10 mg Oral Daily  . atorvastatin  80 mg Oral q1800  . budesonide (PULMICORT) nebulizer solution  0.5 mg  Nebulization BID  . carvedilol  25 mg Oral BID  . ferrous sulfate  325 mg Oral Q breakfast  . folic acid  1 mg Oral Daily  . furosemide  40 mg Oral Daily  . hydrALAZINE  100 mg Oral Daily  . isosorbide dinitrate  40 mg Oral Daily  . mometasone-formoterol  2 puff Inhalation BID  . multivitamin with minerals  1 tablet Oral Daily  . pantoprazole  40 mg Oral Daily  . sodium chloride  3 mL Intravenous Q12H  . thiamine  100 mg Oral Daily   Or  . thiamine  100 mg Intravenous Daily   Continuous Infusions:  PRN Meds:.acetaminophen, albuterol, hydrALAZINE, ipratropium-albuterol, RESOURCE THICKENUP CLEAR  Antibiotics    Anti-infectives    None     Subjective:   [name] seen and examined today.  Patient has no complaints today.  Denies chest pain, shortness of breath, dizziness or headache, abdominal pain, nausea or vomiting.  Feels hungry, but knows he cannot swallow.    Objective:   Filed Vitals:   04/20/15 2056 04/21/15 0602 04/21/15 0815 04/21/15 0935  BP: 147/58 149/60  141/60  Pulse: 82 80 72 82  Temp: 98.3 F (36.8 C) 98 F (36.7 C)  97.8 F (36.6 C)  TempSrc: Oral Oral  Oral  Resp: Height:      Weight:  62.18 kg (137 lb 1.3 oz)    SpO2: 98% 100% 100% 93%    Wt Readings from Last 3 Encounters:  04/21/15 62.18 kg (137 lb 1.3 oz)  04/17/15 67.405 kg (148 lb 9.6 oz)  06/28/13 56.246 kg (124 lb)     Intake/Output Summary (Last 24 hours) at 04/21/15 1052 Last data filed at 04/21/15 0804  Gross per 24 hour  Intake    180 ml  Output   1702 ml  Net  -1522 ml    Exam (no change)  General: Well developed, thin, NAD  HEENT: NCAT,  mucous membranes moist.   Cardiovascular: S1 S2 auscultated, RRR, no murmur  Respiratory: Clear to auscultation bilaterally  Abdomen: Soft, nontender, nondistended, + bowel sounds  Extremities: warm dry without cyanosis clubbing  Neuro: AAOx3, nonfocal  Psych: Appropriate mood and affect  Data Review   Micro  Results No results found for this or any previous visit (from the past 240 hour(s)).  Radiology Reports Ct Abdomen Wo Contrast  04/20/2015  CLINICAL DATA:  Evaluate for percutaneous gastrostomy tube placement. EXAM: CT ABDOMEN WITHOUT CONTRAST TECHNIQUE: Multidetector CT imaging of the abdomen was performed following the standard protocol without IV contrast. COMPARISON:  None. FINDINGS: Lower chest: The heart is markedly enlarged. No pericardial effusion. Coronary artery calcifications are noted. Advanced aortic calcifications without focal aneurysm. The distal esophagus is grossly normal. There are small bilateral pleural effusions and overlying atelectasis. Hepatobiliary: No obvious hepatic lesions or intrahepatic biliary dilatation. The  gallbladder is grossly normal. No common bile duct dilatation. Pancreas: No mass, inflammation or ductal dilatation. Spleen: Small spleen.  No focal lesions. Adrenals/Urinary Tract: Numerous bilateral renal cysts and extensive renal artery calcifications. No hydronephrosis. Stomach/Bowel: The stomach, duodenum, visualized small bowel and visualized colon are grossly normal without oral contrast. There is no interposing of the transverse colon between the anterior wall of the stomach and the anterior abdominal wall. The transverse colon is just below the stomach. Vascular/Lymphatic: Advanced atherosclerotic calcifications involving the aorta and branch vessels. Maximum aortic dimension is 29.5 mm. No obvious mesenteric or retroperitoneal mass or adenopathy. Scattered lymph nodes are noted. Other: No ascites or abdominal wall hernia Musculoskeletal: No significant osseous findings. IMPRESSION: 1. Small bilateral pleural effusions and overlying atelectasis. 2. Cardiac enlargement. 3. Advanced atherosclerotic calcifications involving the aorta and branch vessels. 4. Numerous bilateral renal cysts. 5. The transverse colon is not interposed between the anterior wall of the  stomach and the anterior abdominal wall. Electronically Signed   By: Rudie Meyer M.D.   On: 04/20/2015 11:25    CBC  Recent Labs Lab 04/17/15 0928 04/17/15 2216 04/18/15 0210 04/18/15 2235 04/19/15 0247 04/20/15 0224 04/21/15 0252  WBC 5.1 5.5 5.2 6.1 6.2 5.3 5.9  HGB 6.6* 6.7* 6.4* 7.1* 7.3* 7.5* 8.5*  HCT 22.0* 22.0* 21.2* 23.1* 23.2* 24.5* 27.5*  PLT 325 325 307 301 306 302 300  MCV 84.9 83.3 82.8 83.1 82.9 83.3 83.6  MCH 25.5* 25.4* 25.0* 25.5* 26.1 25.5* 25.8*  MCHC 30.0 30.5 30.2 30.7 31.5 30.6 30.9  RDW 19.3* 19.0* 18.9* 18.1* 17.9* 18.3* 18.3*  LYMPHSABS 1.6 1.7  --   --   --   --   --   MONOABS 0.4 0.5  --   --   --   --   --   EOSABS 0.1 0.0  --   --   --   --   --   BASOSABS 0.0 0.0  --   --   --   --   --     Chemistries   Recent Labs Lab 04/17/15 0928 04/17/15 2216 04/18/15 0210 04/19/15 0247 04/20/15 0224 04/20/15 1011 04/21/15 0252  NA 144 144 145 144 146*  --  141  K 4.6 4.2 4.3 4.3 4.6  --  4.5  CL 111* 106 111 112* 111  --  107  CO2 24 26 27 26 26   --  28  GLUCOSE 88 113* 101* 89 81  --  98  BUN 48* 49* 48* 46* 43*  --  40*  CREATININE 2.52* 2.77* 2.71* 2.61* 2.24*  --  2.24*  CALCIUM 8.7 8.8* 8.5* 8.4* 8.9  --  8.6*  MG  --   --   --   --   --  1.9  --   AST 20 20  --   --   --   --   --   ALT 22 22  --   --   --   --   --   ALKPHOS 65 70  --   --   --   --   --   BILITOT 0.3 0.3  --   --   --   --   --    ------------------------------------------------------------------------------------------------------------------ estimated creatinine clearance is 27 mL/min (by C-G formula based on Cr of 2.24). ------------------------------------------------------------------------------------------------------------------ No results for input(s): HGBA1C in the last 72 hours. ------------------------------------------------------------------------------------------------------------------ No results for input(s): CHOL, HDL, LDLCALC, TRIG, CHOLHDL,  LDLDIRECT  in the last 72 hours. ------------------------------------------------------------------------------------------------------------------ No results for input(s): TSH, T4TOTAL, T3FREE, THYROIDAB in the last 72 hours.  Invalid input(s): FREET3 ------------------------------------------------------------------------------------------------------------------ No results for input(s): VITAMINB12, FOLATE, FERRITIN, TIBC, IRON, RETICCTPCT in the last 72 hours.  Coagulation profile  Recent Labs Lab 04/17/15 2216  INR 1.26    No results for input(s): DDIMER in the last 72 hours.  Cardiac Enzymes No results for input(s): CKMB, TROPONINI, MYOGLOBIN in the last 168 hours.  Invalid input(s): CK ------------------------------------------------------------------------------------------------------------------ Invalid input(s): POCBNP    Tomara Youngberg D.O. on 04/21/2015 at 10:52 AM  Between 7am to 7pm - Pager - [phone]  After 7pm go to [email or web address] - password [email or web address]  And look for the night coverage person covering for me after hours  [facility] Office  [phone]

## 2015-04-22 LAB — CBC
HEMATOCRIT: 26.4 % — AB (ref 39.0–52.0)
HEMOGLOBIN: 8 g/dL — AB (ref 13.0–17.0)
MCH: 25.2 pg — AB (ref 26.0–34.0)
MCHC: 30.3 g/dL (ref 30.0–36.0)
MCV: 83.3 fL (ref 78.0–100.0)
Platelets: 286 10*3/uL (ref 150–400)
RBC: 3.17 MIL/uL — ABNORMAL LOW (ref 4.22–5.81)
RDW: 18.4 % — ABNORMAL HIGH (ref 11.5–15.5)
WBC: 6.2 10*3/uL (ref 4.0–10.5)

## 2015-04-22 LAB — BASIC METABOLIC PANEL
Anion gap: 6 (ref 5–15)
BUN: 35 mg/dL — AB (ref 6–20)
CHLORIDE: 107 mmol/L (ref 101–111)
CO2: 30 mmol/L (ref 22–32)
CREATININE: 2.23 mg/dL — AB (ref 0.61–1.24)
Calcium: 8.2 mg/dL — ABNORMAL LOW (ref 8.9–10.3)
GFR calc Af Amer: 33 mL/min — ABNORMAL LOW (ref >60)
GFR calc non Af Amer: 28 mL/min — ABNORMAL LOW (ref >60)
Glucose, Bld: 91 mg/dL (ref 65–99)
Potassium: 4 mmol/L (ref 3.5–5.1)
Sodium: 143 mmol/L (ref 135–145)

## 2015-04-22 NOTE — Progress Notes (Signed)
[facility]                                                                              Patient Demographics  Martin Cain, is a 69 y.o. male, DOB - [birth year], [id number]  Admit date - 04/17/2015   Admitting Physician Hillary Bow, DO  Outpatient Primary MD for the patient is Pcp Not In System  LOS -    Chief Complaint  Patient presents with  . Anemia      HPI on 04/17/2015 by Dr. Lyda Perone [place] is a 69 y.o. male with h/o CHF (EF 42% in 2014), CAD s/p CABG in 2000, CKD stage 3, ongoing EtOH abuse, medication non-compliance. He has recently been hospitalized in [place] at [facility] for CHF and also anemia with HGB of 5.7. Obtaining the formal records is pending but it is known that during that admit he had a normal EGD on 03/30/15, normal colonoscopy a year ago. He was transfused PRBC units and discharged with plan for outpatient capsule endoscopy. Before this could be completed however, he moved to [facility]. Today he was seen at the cardiologists office here in town for follow up. Labs drawn at the cardiologists office came back showing HGB of 6.6. Patient was sent into the ED.  Assessment & Plan   Symptomatic anemia -Hemoglobin upon admission 6.4, received 2uPRBCs, current hemoglobin 8 -Continue to monitor hemoglobin -FOBT negative -Patient was recently admitted to [facility] in [place] for anemia, was supposed to have capsule endoscopy as an outpatient however patient moved to [place] -03/30/2015 EGD showed atypical mucosa suggesting benign Brunner's gland nodule -Gastroenterology consulted and appreciated, possible capsule endoscopy   Acute on chronic combined systolic and diastolic CHF -Continue to monitor daily weights, intake and output -EF 2014 was 42%, According to records from [place], last EF 12/2014: 10-20% on TTE, TEE <20% -Echocardiogram EF 25-30% -Continue lasix (will change to IV form), monitor daily weights,  I/Os -UOP 1375cc over past 24hrs; weight is down from admssion -Not on ACEi due to CKD stage 4  Coronary artery disease -Currently stable, no complaints of chest pain -Status post CABG -Continue aspirin, Coreg, statin -ACEi held  Chronic kidney disease, stage 4 -review patient's records, baseline creatinine approximately 2.5, current Cr 2.23 -Continue to monitor BMP  Hypertension -Continue Lasix, hydralazine,isochron, Coreg, amlodipine -Hydralazine IV PRN  Dysphagia with aspiration -Patient has a 40lb weight loss.  Speech therapy consult placed patient on dysphagia 2 diet. Patient continues to have moments of aspiration. Per review of records from [place], suspicion of PSP (discharge summary 09/16/2014) -Spoke with neurology, needs outpatient follow up.  Patient will need therapy, but unfortunately no inpatient solution. -Spoke with patient regarding peg tube, he agreed to peg placement -IR consulted and appreciated- peg to be placed on Monday -Patient still requires hospitalization as he is unable to take his meds or keep in nutrition -Have placed him on a dysphagia 2 diet with nectar thick liquids, however, amount of food is minimal.  Cannot place on IVF due to acute CHF. Patient understands the risk of aspiration.   Goals of care -Have consulted palliative care to discuss goals with patient and  daughter given patient a couple comorbidities. -Patient is now DNR -Patient cannot be discharged with hospice due to PACE which may start Nov 1. When medically stable, patient will be discharged with [facility] services, 3-N-1, Wheelchair manual, Hospital bed  Code Status: Full  Family Communication: None at bedside  Disposition Plan: Admitted, pending peg placement, continue to monitor hemoglobin  Time Spent in minutes   30 minutes  Procedures  Echocardiogram  Consults   Gastroenterology Neurology via phone Interventional radiology Palliative care  DVT Prophylaxis  SCDs  Lab Results   Component Value Date   PLT 286 04/22/2015    Medications  Scheduled Meds: . allopurinol  100 mg Oral Daily  . amLODipine  10 mg Oral Daily  . atorvastatin  80 mg Oral q1800  . budesonide (PULMICORT) nebulizer solution  0.5 mg Nebulization BID  . carvedilol  25 mg Oral BID  . ferrous sulfate  325 mg Oral Q breakfast  . folic acid  1 mg Oral Daily  . furosemide  40 mg Intravenous Daily  . hydrALAZINE  100 mg Oral Daily  . isosorbide dinitrate  40 mg Oral Daily  . mometasone-formoterol  2 puff Inhalation BID  . multivitamin with minerals  1 tablet Oral Daily  . pantoprazole  40 mg Oral Daily  . sodium chloride  3 mL Intravenous Q12H  . thiamine  100 mg Oral Daily   Or  . thiamine  100 mg Intravenous Daily   Continuous Infusions:  PRN Meds:.acetaminophen, albuterol, hydrALAZINE, ipratropium-albuterol, RESOURCE THICKENUP CLEAR  Antibiotics    Anti-infectives    None     Subjective:   [name] seen and examined today.  Patient has no complaints today.  Denies SOB, chest pain, abdominal pain.   Objective:   Filed Vitals:   04/21/15 2055 04/22/15 0500 04/22/15 0841 04/22/15 1001  BP: 141/65 152/69  152/68  Pulse: 81 83  80  Temp: 97.9 F (36.6 C) 97.8 F (36.6 C)  97.9 F (36.6 C)  TempSrc: Oral Oral  Oral  Resp: Height:      Weight:  60.601 kg (133 lb 9.6 oz)    SpO2: 98% 99% 98% 96%    Wt Readings from Last 3 Encounters:  04/22/15 60.601 kg (133 lb 9.6 oz)  04/17/15 67.405 kg (148 lb 9.6 oz)  06/28/13 56.246 kg (124 lb)     Intake/Output Summary (Last 24 hours) at 04/22/15 1016 Last data filed at 04/22/15 0837  Gross per 24 hour  Intake    600 ml  Output   1175 ml  Net   -575 ml    Exam (no change)  General: Well developed, thin, NAD  HEENT: NCAT,  mucous membranes moist.   Cardiovascular: S1 S2 auscultated, RRR, no murmur  Respiratory: Clear to auscultation bilaterally, cough when eating  Abdomen: Soft, nontender,  nondistended, + bowel sounds  Extremities: warm dry without cyanosis clubbing  Neuro: AAOx3, nonfocal  Psych: Appropriate mood and affect, pleasant  Data Review   Micro Results No results found for this or any previous visit (from the past 240 hour(s)).  Radiology Reports Ct Abdomen Wo Contrast  04/20/2015  CLINICAL DATA:  Evaluate for percutaneous gastrostomy tube placement. EXAM: CT ABDOMEN WITHOUT CONTRAST TECHNIQUE: Multidetector CT imaging of the abdomen was performed following the standard protocol without IV contrast. COMPARISON:  None. FINDINGS: Lower chest: The heart is markedly enlarged. No pericardial effusion. Coronary artery calcifications are noted. Advanced aortic calcifications without focal  aneurysm. The distal esophagus is grossly normal. There are small bilateral pleural effusions and overlying atelectasis. Hepatobiliary: No obvious hepatic lesions or intrahepatic biliary dilatation. The gallbladder is grossly normal. No common bile duct dilatation. Pancreas: No mass, inflammation or ductal dilatation. Spleen: Small spleen.  No focal lesions. Adrenals/Urinary Tract: Numerous bilateral renal cysts and extensive renal artery calcifications. No hydronephrosis. Stomach/Bowel: The stomach, duodenum, visualized small bowel and visualized colon are grossly normal without oral contrast. There is no interposing of the transverse colon between the anterior wall of the stomach and the anterior abdominal wall. The transverse colon is just below the stomach. Vascular/Lymphatic: Advanced atherosclerotic calcifications involving the aorta and branch vessels. Maximum aortic dimension is 29.5 mm. No obvious mesenteric or retroperitoneal mass or adenopathy. Scattered lymph nodes are noted. Other: No ascites or abdominal wall hernia Musculoskeletal: No significant osseous findings. IMPRESSION: 1. Small bilateral pleural effusions and overlying atelectasis. 2. Cardiac enlargement. 3. Advanced  atherosclerotic calcifications involving the aorta and branch vessels. 4. Numerous bilateral renal cysts. 5. The transverse colon is not interposed between the anterior wall of the stomach and the anterior abdominal wall. Electronically Signed   By: Rudie Meyer M.D.   On: 04/20/2015 11:25    CBC  Recent Labs Lab 04/17/15 0928 04/17/15 2216  04/18/15 2235 04/19/15 0247 04/20/15 0224 04/21/15 0252 04/22/15 0248  WBC 5.1 5.5  < > 6.1 6.2 5.3 5.9 6.2  HGB 6.6* 6.7*  < > 7.1* 7.3* 7.5* 8.5* 8.0*  HCT 22.0* 22.0*  < > 23.1* 23.2* 24.5* 27.5* 26.4*  PLT 325 325  < > 301 306 302 300 286  MCV 84.9 83.3  < > 83.1 82.9 83.3 83.6 83.3  MCH 25.5* 25.4*  < > 25.5* 26.1 25.5* 25.8* 25.2*  MCHC 30.0 30.5  < > 30.7 31.5 30.6 30.9 30.3  RDW 19.3* 19.0*  < > 18.1* 17.9* 18.3* 18.3* 18.4*  LYMPHSABS 1.6 1.7  --   --   --   --   --   --   MONOABS 0.4 0.5  --   --   --   --   --   --   EOSABS 0.1 0.0  --   --   --   --   --   --   BASOSABS 0.0 0.0  --   --   --   --   --   --   < > = values in this interval not displayed.  Chemistries   Recent Labs Lab 04/17/15 0928  04/17/15 2216 04/18/15 0210 04/19/15 0247 04/20/15 0224 04/20/15 1011 04/21/15 0252 04/22/15 0248  NA 144  --  144 145 144 146*  --  141 143  K 4.6  --  4.2 4.3 4.3 4.6  --  4.5 4.0  CL 111*  --  106 111 112* 111  --  107 107  CO2 24  --  --  28 30  GLUCOSE 88  --  113* 101* 89 81  --  98 91  BUN 48*  --  49* 48* 46* 43*  --  40* 35*  CREATININE 2.52*  < > 2.77* 2.71* 2.61* 2.24*  --  2.24* 2.23*  CALCIUM 8.7  --  8.8* 8.5* 8.4* 8.9  --  8.6* 8.2*  MG  --   --   --   --   --   --  1.9  --   --   AST 20  --  20  --   --   --   --   --   --   ALT 22  --  22  --   --   --   --   --   --   ALKPHOS 65  --  70  --   --   --   --   --   --   BILITOT 0.3  --  0.3  --   --   --   --   --   --   < > = values in this interval not  displayed. ------------------------------------------------------------------------------------------------------------------ estimated creatinine clearance is 26.4 mL/min (by C-G formula based on Cr of 2.23). ------------------------------------------------------------------------------------------------------------------ No results for input(s): HGBA1C in the last 72 hours. ------------------------------------------------------------------------------------------------------------------ No results for input(s): CHOL, HDL, LDLCALC, TRIG, CHOLHDL, LDLDIRECT in the last 72 hours. ------------------------------------------------------------------------------------------------------------------ No results for input(s): TSH, T4TOTAL, T3FREE, THYROIDAB in the last 72 hours.  Invalid input(s): FREET3 ------------------------------------------------------------------------------------------------------------------ No results for input(s): VITAMINB12, FOLATE, FERRITIN, TIBC, IRON, RETICCTPCT in the last 72 hours.  Coagulation profile  Recent Labs Lab 04/17/15 2216  INR 1.26    No results for input(s): DDIMER in the last 72 hours.  Cardiac Enzymes No results for input(s): CKMB, TROPONINI, MYOGLOBIN in the last 168 hours.  Invalid input(s): CK ------------------------------------------------------------------------------------------------------------------ Invalid input(s): POCBNP    Sorren Vallier D.O. on 04/22/2015 at 10:16 AM  Between 7am to 7pm - Pager - [phone]  After 7pm go to [email or web address] - password [email or web address]  And look for the night coverage person covering for me after hours  [facility] Office  [phone]

## 2015-04-22 NOTE — Progress Notes (Signed)
Patient and family educated on importance of thickening thin liquids. Both parties verbalizes understanding and continue to request for thin liquids.

## 2015-04-22 NOTE — Clinical Documentation Improvement (Signed)
Hospitalist  Can the diagnosis of anemia be further specified?   Iron deficiency Anemia  Acute Blood Loss Anemia, including the suspected or known cause  Nutritional anemia, including the nutrition or mineral deficits  Acute Blood Loss Anemia on Chronic Anemia, including the suspected or known cause  Anemia of chronic disease, including the associated chronic disease state  Other  Clinically Undetermined  Document any associated diagnoses/conditions.   Supporting Information: Symptomatic anemia -Hemoglobin upon admission 6.4, received 2uPRBCs, current hemoglobin 8 -Continue to monitor hemoglobin per 10/23 progress notes.  Labs:  H/H: 10/22: 8.5/27.5. 10/19: 6.4/21.2.   Please exercise your independent, professional judgment when responding. A specific answer is not anticipated or expected.   Thank Sabino DonovanYou,  Nithin Demeo [facility] [place] [phone]

## 2015-04-23 ENCOUNTER — Inpatient Hospital Stay (HOSPITAL_COMMUNITY): Payer: Medicare HMO

## 2015-04-23 LAB — PROTIME-INR
INR: 1.32 (ref 0.00–1.49)
Prothrombin Time: 16.5 seconds — ABNORMAL HIGH (ref 11.6–15.2)

## 2015-04-23 LAB — CBC
HEMATOCRIT: 28.5 % — AB (ref 39.0–52.0)
HEMOGLOBIN: 8.5 g/dL — AB (ref 13.0–17.0)
MCH: 24.9 pg — ABNORMAL LOW (ref 26.0–34.0)
MCHC: 29.8 g/dL — ABNORMAL LOW (ref 30.0–36.0)
MCV: 83.3 fL (ref 78.0–100.0)
Platelets: 297 10*3/uL (ref 150–400)
RBC: 3.42 MIL/uL — AB (ref 4.22–5.81)
RDW: 18.3 % — ABNORMAL HIGH (ref 11.5–15.5)
WBC: 6.2 10*3/uL (ref 4.0–10.5)

## 2015-04-23 LAB — BASIC METABOLIC PANEL
ANION GAP: 9 (ref 5–15)
BUN: 34 mg/dL — ABNORMAL HIGH (ref 6–20)
CALCIUM: 8.6 mg/dL — AB (ref 8.9–10.3)
CO2: 30 mmol/L (ref 22–32)
Chloride: 107 mmol/L (ref 101–111)
Creatinine, Ser: 2.38 mg/dL — ABNORMAL HIGH (ref 0.61–1.24)
GFR calc Af Amer: 30 mL/min — ABNORMAL LOW (ref >60)
GFR, EST NON AFRICAN AMERICAN: 26 mL/min — AB (ref >60)
Glucose, Bld: 91 mg/dL (ref 65–99)
POTASSIUM: 4.2 mmol/L (ref 3.5–5.1)
SODIUM: 146 mmol/L — AB (ref 135–145)

## 2015-04-23 LAB — APTT: APTT: 35 s (ref 24–37)

## 2015-04-23 MED ORDER — MIDAZOLAM HCL 2 MG/2ML IJ SOLN
INTRAMUSCULAR | Status: AC | PRN
  Administered 2015-04-23: 1 mg via INTRAVENOUS

## 2015-04-23 MED ORDER — CEFAZOLIN SODIUM-DEXTROSE 2-3 GM-% IV SOLR
2.0000 g | INTRAVENOUS | Status: DC
  Filled 2015-04-23: qty 50

## 2015-04-23 MED ORDER — JEVITY 1.5 CAL/FIBER PO LIQD
240.0000 mL | Freq: Three times a day (TID) | ORAL | Status: DC
  Filled 2015-04-23 (×4): qty 1000

## 2015-04-23 MED ORDER — MIDAZOLAM HCL 2 MG/2ML IJ SOLN
INTRAMUSCULAR | Status: AC
  Filled 2015-04-23: qty 2

## 2015-04-23 MED ORDER — BACITRACIN-NEOMYCIN-POLYMYXIN OINTMENT TUBE
TOPICAL_OINTMENT | Freq: Every day | CUTANEOUS | Status: DC
  Administered 2015-04-25: 15:00:00 via TOPICAL
  Filled 2015-04-23 (×2): qty 15

## 2015-04-23 MED ORDER — FENTANYL CITRATE (PF) 100 MCG/2ML IJ SOLN
INTRAMUSCULAR | Status: AC | PRN
  Administered 2015-04-23: 50 ug via INTRAVENOUS

## 2015-04-23 MED ORDER — GLUCAGON HCL RDNA (DIAGNOSTIC) 1 MG IJ SOLR
INTRAMUSCULAR | Status: AC
  Filled 2015-04-23: qty 1

## 2015-04-23 MED ORDER — FENTANYL CITRATE (PF) 100 MCG/2ML IJ SOLN
INTRAMUSCULAR | Status: AC
  Filled 2015-04-23: qty 2

## 2015-04-23 MED ORDER — JEVITY 1.2 CAL PO LIQD: 1000.0000 mL | ORAL | Status: DC

## 2015-04-23 MED ORDER — CEFAZOLIN SODIUM-DEXTROSE 2-3 GM-% IV SOLR
INTRAVENOUS | Status: AC
  Filled 2015-04-23: qty 50

## 2015-04-23 MED ORDER — JEVITY 1.5 CAL/FIBER PO LIQD
240.0000 mL | Freq: Three times a day (TID) | ORAL | Status: DC
  Administered 2015-04-24 – 2015-04-25 (×4): 240 mL
  Filled 2015-04-23 (×10): qty 1000

## 2015-04-23 MED ORDER — BACITRACIN-NEOMYCIN-POLYMYXIN 400-5-5000 EX OINT
1.0000 "application " | TOPICAL_OINTMENT | Freq: Every day | CUTANEOUS | Status: DC
  Filled 2015-04-23: qty 1

## 2015-04-23 MED ORDER — MORPHINE SULFATE (PF) 2 MG/ML IV SOLN
1.0000 mg | INTRAVENOUS | Status: DC | PRN
  Administered 2015-04-23 – 2015-04-24 (×3): 1 mg via INTRAVENOUS
  Filled 2015-04-23 (×3): qty 1

## 2015-04-23 MED ORDER — IOHEXOL 300 MG/ML  SOLN
50.0000 mL | Freq: Once | INTRAMUSCULAR | Status: DC | PRN
  Administered 2015-04-23: 15 mL
  Filled 2015-04-23: qty 50

## 2015-04-23 NOTE — Progress Notes (Signed)
Pharmacist Heart Failure Core Measure Documentation  Assessment: [name] has an EF documented as 25-30% on 04/18/15 by Dr. Catha GosselinMikhail.  Rationale: Heart failure patients with left ventricular systolic dysfunction (LVSD) and an EF < 40% should be prescribed an angiotensin converting enzyme inhibitor (ACEI) or angiotensin receptor blocker (ARB) at discharge unless a contraindication is documented in the medical record.  This patient is not currently on an ACEI or ARB for HF.  This note is being placed in the record in order to provide documentation that a contraindication to the use of these agents is present for this encounter.  ACE Inhibitor or Angiotensin Receptor Blocker is contraindicated (specify all that apply)  []   ACEI allergy AND ARB allergy []   Angioedema []   Moderate or severe aortic stenosis []   Hyperkalemia []   Hypotension []   Renal artery stenosis [x]   Worsening renal function, preexisting renal disease or dysfunction (CKD stage 4)  Martin Cain, PharmD Clinical Pharmacist Pager [phone] 04/23/2015 9:23 AM

## 2015-04-23 NOTE — Progress Notes (Signed)
CSW met with patient and daughter Martin Cain today. They are requesting short term SNF due to new PEG tube and weakness expressed by daughter. She is not able to stay with patient at home at this time.  Active bed search in place and awaiting bed offers. Will require [employer] Auth.  Daughter provided with several bed offers this afternoon by Domenica Reamer, LCSWA.  Also- awaiting PT eval which will be needed to assist with [facility] auth.  Daughter wants short term SNF and then plans to bring patient home with [name] and PACE services.    Lorie Phenix. Pauline Good, [place]

## 2015-04-23 NOTE — Progress Notes (Signed)
Peg tube connected to low wall suction per radiology orders, will continue to monitor.

## 2015-04-23 NOTE — Progress Notes (Signed)
Paged MD at 1130 to get meds switched to IV. Will continue to monitor.

## 2015-04-23 NOTE — Progress Notes (Signed)
PT Cancellation Note  Patient Details Name: [name] MRN: [id number] DOB: 11/10/1945   Cancelled Treatment:    Reason Eval/Treat Not Completed: Medical issues which prohibited therapy (asleep from a procedure).  Will try later as time allows.   Ivar DrapeStout, Ajay Strubel E 04/23/2015, 12:34 PM   Samul Dadauth Quintin Hjort, PT MS Acute Rehab Dept. Number: [facility] [id number] and [facility] [phone]

## 2015-04-23 NOTE — Procedures (Signed)
Interventional Radiology Procedure Note  Procedure: Placement of percutaneous 20F pull-through gastrostomy tube. Complications: None Recommendations: - NPO except for sips and chips remainder of today and overnight - Maintain G-tube to LWS until tomorrow morning  - May advance diet as tolerated and begin using tube tomorrow morning  Signed,   Maybel Dambrosio S. Aarnav Steagall, DO   

## 2015-04-23 NOTE — Progress Notes (Signed)
[facility]                                                                              Patient Demographics  Martin Cain, is a 69 y.o. male, DOB - 04-22-1946, ZOX:[id number]  Admit date - 04/17/2015   Admitting Physician Hillary Bow, DO  Outpatient Primary MD for the patient is Pcp Not In System  LOS -    Chief Complaint  Patient presents with  . Anemia      HPI on 04/17/2015 by Dr. Lyda Perone Martin Cain is a 69 y.o. male with h/o CHF (EF 42% in 2014), CAD s/p CABG in 2000, CKD stage 3, ongoing EtOH abuse, medication non-compliance. He has recently been hospitalized in [place] at [facility] for CHF and also anemia with HGB of 5.7. Obtaining the formal records is pending but it is known that during that admit he had a normal EGD on 03/30/15, normal colonoscopy a year ago. He was transfused PRBC units and discharged with plan for outpatient capsule endoscopy. Before this could be completed however, he moved to [facility]. Today he was seen at the cardiologists office here in town for follow up. Labs drawn at the cardiologists office came back showing HGB of 6.6. Patient was sent into the ED.  Assessment & Plan   Symptomatic anemia -Hemoglobin upon admission 6.4, received 2uPRBCs, current hemoglobin 8 -Unknown etiology  -Continue to monitor hemoglobin -FOBT negative -Patient was recently admitted to [facility] in [place] for anemia, was supposed to have capsule endoscopy as an outpatient however patient moved to [place] -03/30/2015 EGD showed atypical mucosa suggesting benign Brunner's gland nodule -Gastroenterology consulted and appreciated, possible capsule endoscopy   Acute on chronic combined systolic and diastolic CHF -Continue to monitor daily weights, intake and output -EF 2014 was 42%, According to records from [place], last EF 12/2014: 10-20% on TTE, TEE <20% -Echocardiogram EF 25-30% -Continue lasix, monitor daily weights, I/Os -UOP  1375cc over past 24hrs; weight is down from admssion (if 149lb was correct, currently 132lbs) -Not on ACEi due to CKD stage 4  Coronary artery disease -Currently stable, no complaints of chest pain -Status post CABG -Continue aspirin, Coreg, statin -ACEi held  Chronic kidney disease, stage 4 -review patient's records, baseline creatinine approximately 2.5, current Cr 2.38 -Continue to monitor BMP  Hypertension -Continue Lasix, hydralazine,isochron, Coreg, amlodipine -Hydralazine IV PRN  Dysphagia with aspiration -Patient has a 40lb weight loss.  Speech therapy consult placed patient on dysphagia 2 diet. Patient continues to have moments of aspiration. Per review of records from [place], suspicion of PSP (discharge summary 09/16/2014) -Spoke with neurology, needs outpatient follow up.  Patient will need therapy, but unfortunately no inpatient solution. -Spoke with patient regarding peg tube, he agreed to peg placement -IR consulted and appreciated- peg to be placed today 04/23/2015 -Will consult nutrition once peg is placed to initiate tube feeds -Patient still requires hospitalization as he is unable to take his meds or keep in nutrition -Have placed him on a dysphagia 2 diet with nectar thick liquids, however, amount of food is minimal.  Cannot place on IVF due to acute CHF. Patient understands the risk of aspiration.  Goals of care -Have consulted palliative care to discuss goals with patient and daughter given patient a couple comorbidities. -Patient is now DNR -Patient cannot be discharged with hospice due to PACE which may start Nov 1. When medically stable, patient will be discharged with [facility] services, 3-N-1, Wheelchair manual, Hospital bed  Code Status: Full  Family Communication: None at bedside  Disposition Plan: Admitted, pending peg placement, continue to monitor hemoglobin. Expect d/c within 24-48hrs.  Time Spent in minutes   30 minutes  Procedures   Echocardiogram  Consults   Gastroenterology Neurology via phone Interventional radiology Palliative care  DVT Prophylaxis  SCDs  Lab Results  Component Value Date   PLT 297 04/23/2015    Medications  Scheduled Meds: . allopurinol  100 mg Oral Daily  . amLODipine  10 mg Oral Daily  . atorvastatin  80 mg Oral q1800  . budesonide (PULMICORT) nebulizer solution  0.5 mg Nebulization BID  . carvedilol  25 mg Oral BID  . ferrous sulfate  325 mg Oral Q breakfast  . folic acid  1 mg Oral Daily  . furosemide  40 mg Intravenous Daily  . hydrALAZINE  100 mg Oral Daily  . isosorbide dinitrate  40 mg Oral Daily  . mometasone-formoterol  2 puff Inhalation BID  . multivitamin with minerals  1 tablet Oral Daily  . pantoprazole  40 mg Oral Daily  . sodium chloride  3 mL Intravenous Q12H  . thiamine  100 mg Oral Daily   Or  . thiamine  100 mg Intravenous Daily   Continuous Infusions:  PRN Meds:.acetaminophen, albuterol, hydrALAZINE, ipratropium-albuterol, RESOURCE THICKENUP CLEAR  Antibiotics    Anti-infectives    None     Subjective:   Martin Cain seen and examined today.  Patient has no complaints. Denies Chest pain, SOB, abdominal pain, nausea, vomiting.   Objective:   Filed Vitals:   04/23/15 0000 04/23/15 0504 04/23/15 0700 04/23/15 0815  BP: 140/54 158/67  152/64  Pulse:  77  68  Temp:  98 F (36.7 C)  97.9 F (36.6 C)  TempSrc:  Oral  Oral  Resp:  20  20  Height:      Weight:  59.921 kg (132 lb 1.6 oz)    SpO2:  95% 95% 99%    Wt Readings from Last 3 Encounters:  04/23/15 59.921 kg (132 lb 1.6 oz)  04/17/15 67.405 kg (148 lb 9.6 oz)  06/28/13 56.246 kg (124 lb)     Intake/Output Summary (Last 24 hours) at 04/23/15 0957 Last data filed at 04/23/15 1610  Gross per 24 hour  Intake    600 ml  Output   1500 ml  Net   -900 ml    Exam (no change)  General: Well developed, thin, NAD  HEENT: NCAT,  mucous membranes moist.   Cardiovascular: S1 S2  auscultated, RRR, soft murmur  Respiratory: Clear to auscultation  Abdomen: Soft, nontender, nondistended, + bowel sounds  Extremities: warm dry without cyanosis clubbing  Neuro: AAOx3, nonfocal  Psych: Appropriate mood and affect, pleasant.  Intact judgement and insight.  Data Review   Micro Results No results found for this or any previous visit (from the past 240 hour(s)).  Radiology Reports Ct Abdomen Wo Contrast  04/20/2015  CLINICAL DATA:  Evaluate for percutaneous gastrostomy tube placement. EXAM: CT ABDOMEN WITHOUT CONTRAST TECHNIQUE: Multidetector CT imaging of the abdomen was performed following the standard protocol without IV contrast. COMPARISON:  None. FINDINGS: Lower chest: The heart is  markedly enlarged. No pericardial effusion. Coronary artery calcifications are noted. Advanced aortic calcifications without focal aneurysm. The distal esophagus is grossly normal. There are small bilateral pleural effusions and overlying atelectasis. Hepatobiliary: No obvious hepatic lesions or intrahepatic biliary dilatation. The gallbladder is grossly normal. No common bile duct dilatation. Pancreas: No mass, inflammation or ductal dilatation. Spleen: Small spleen.  No focal lesions. Adrenals/Urinary Tract: Numerous bilateral renal cysts and extensive renal artery calcifications. No hydronephrosis. Stomach/Bowel: The stomach, duodenum, visualized small bowel and visualized colon are grossly normal without oral contrast. There is no interposing of the transverse colon between the anterior wall of the stomach and the anterior abdominal wall. The transverse colon is just below the stomach. Vascular/Lymphatic: Advanced atherosclerotic calcifications involving the aorta and branch vessels. Maximum aortic dimension is 29.5 mm. No obvious mesenteric or retroperitoneal mass or adenopathy. Scattered lymph nodes are noted. Other: No ascites or abdominal wall hernia Musculoskeletal: No significant osseous  findings. IMPRESSION: 1. Small bilateral pleural effusions and overlying atelectasis. 2. Cardiac enlargement. 3. Advanced atherosclerotic calcifications involving the aorta and branch vessels. 4. Numerous bilateral renal cysts. 5. The transverse colon is not interposed between the anterior wall of the stomach and the anterior abdominal wall. Electronically Signed   By: Rudie Meyer M.D.   On: 04/20/2015 11:25    CBC  Recent Labs Lab 04/17/15 0928 04/17/15 2216  04/19/15 0247 04/20/15 0224 04/21/15 0252 04/22/15 0248 04/23/15 0238  WBC 5.1 5.5  < > 6.2 5.3 5.9 6.2 6.2  HGB 6.6* 6.7*  < > 7.3* 7.5* 8.5* 8.0* 8.5*  HCT 22.0* 22.0*  < > 23.2* 24.5* 27.5* 26.4* 28.5*  PLT 325 325  < > 306 302 300 286 297  MCV 84.9 83.3  < > 82.9 83.3 83.6 83.3 83.3  MCH 25.5* 25.4*  < > 26.1 25.5* 25.8* 25.2* 24.9*  MCHC 30.0 30.5  < > 31.5 30.6 30.9 30.3 29.8*  RDW 19.3* 19.0*  < > 17.9* 18.3* 18.3* 18.4* 18.3*  LYMPHSABS 1.6 1.7  --   --   --   --   --   --   MONOABS 0.4 0.5  --   --   --   --   --   --   EOSABS 0.1 0.0  --   --   --   --   --   --   BASOSABS 0.0 0.0  --   --   --   --   --   --   < > = values in this interval not displayed.  Chemistries   Recent Labs Lab 04/17/15 0928 04/17/15 2216  04/19/15 0247 04/20/15 0224 04/20/15 1011 04/21/15 0252 04/22/15 0248 04/23/15 0238  NA 144 144  < > 144 146*  --  141 143 146*  K 4.6 4.2  < > 4.3 4.6  --  4.5 4.0 4.2  CL 111* 106  < > 112* 111  --  107 107 107  CO2 24 26  < > 26 26  --  28 30 30   GLUCOSE 88 113*  < > 89 81  --  98 91 91  BUN 48* 49*  < > 46* 43*  --  40* 35* 34*  CREATININE 2.52* 2.77*  < > 2.61* 2.24*  --  2.24* 2.23* 2.38*  CALCIUM 8.7 8.8*  < > 8.4* 8.9  --  8.6* 8.2* 8.6*  MG  --   --   --   --   --  1.9  --   --   --   AST 20 20  --   --   --   --   --   --   --   ALT 22 22  --   --   --   --   --   --   --   ALKPHOS 65 70  --   --   --   --   --   --   --   BILITOT 0.3 0.3  --   --   --   --   --   --   --   <  > = values in this interval not displayed. ------------------------------------------------------------------------------------------------------------------ estimated creatinine clearance is 24.5 mL/min (by C-G formula based on Cr of 2.38). ------------------------------------------------------------------------------------------------------------------ No results for input(s): HGBA1C in the last 72 hours. ------------------------------------------------------------------------------------------------------------------ No results for input(s): CHOL, HDL, LDLCALC, TRIG, CHOLHDL, LDLDIRECT in the last 72 hours. ------------------------------------------------------------------------------------------------------------------ No results for input(s): TSH, T4TOTAL, T3FREE, THYROIDAB in the last 72 hours.  Invalid input(s): FREET3 ------------------------------------------------------------------------------------------------------------------ No results for input(s): VITAMINB12, FOLATE, FERRITIN, TIBC, IRON, RETICCTPCT in the last 72 hours.  Coagulation profile  Recent Labs Lab 04/17/15 2216 04/23/15 0238  INR 1.26 1.32    No results for input(s): DDIMER in the last 72 hours.  Cardiac Enzymes No results for input(s): CKMB, TROPONINI, MYOGLOBIN in the last 168 hours.  Invalid input(s): CK ------------------------------------------------------------------------------------------------------------------ Invalid input(s): POCBNP    Lachanda Buczek D.O. on 04/23/2015 at 9:57 AM  Between 7am to 7pm - Pager - [phone]  After 7pm go to [email or web address] - password [email or web address]  And look for the night coverage person covering for me after hours  [facility] Office  [phone]

## 2015-04-23 NOTE — Progress Notes (Signed)
SLP Cancellation Note  Patient Details Name: Martin Cain MRN: [id number] DOB: 02/05/1946   Cancelled treatment:       Reason Eval/Treat Not Completed: Medical issues which prohibited therapy (pt for PEG placement today). Will f/u as able.   Martin Cain, Martin Cain [phone]  Martin Cain, Martin Cain 04/23/2015, 10:00 AM

## 2015-04-23 NOTE — Clinical Social Work Note (Addendum)
Clinical Social Work Assessment  Patient Details  Name: Martin Cain MRN: [id number] Date of Birth: [birth year]  Date of referral:  04/23/15               Reason for consult:  Facility Placement                Permission sought to share information with:  Facility Sport and exercise psychologist, Family Supports Permission granted to share information::  Yes, Verbal Permission Granted  Name::     [place] SN[facility];s, Daughter- Facilities manager::     Relationship::     Contact Information:     Housing/Transportation Living arrangements for the past 2 months:  [place] of Information:  Adult Children, Patient Patient Interpreter Needed:  None Criminal Activity/Legal Involvement Pertinent to Current Situation/Hospitalization:  No - Comment as needed Significant Relationships:  Adult Children, Significant Other Lives with:  Adult Children Do you feel safe going back to the place where you live?  Yes Need for family participation in patient care:  Yes (Comment)  Care giving concerns:  Patient lived in [place] and was residing in a SNF when daughter chose to bring patient to [place] to be near her. Daughter does not feel she can manage patient's care at this time.      Social Worker assessment / plan:  CSW met with patient and daughter Martin Cain this afternoon. Patient was noted to be alert and oriented at times but he had just returned from a PEG placement so his words were noted at times to be slurred and his thoughts somewhat garbled at times.  Patient was accepting of short term SNF and stated that he was recently in a SNF in [place]. Daughter confirmed that he has utilized 31 of his 38 medicare covered days with [employer].  Daughter requests short term for PEG care and PT and then plans to return patient home with home care services.  Bed search process discussed and initiated.  Fl2 placed on chart for MD's signature; active bed search in place   Employment status:  Retired Designer, industrial/product PT Recommendations:  [place] / Referral to community resources:  [place]  Patient/Family's Response to care: Patient and daughter appear content with current care although the PEG insertion was unexpected.  Daughter states simply that she wants her father to get better so that she can take him home.  Patient/Family's Understanding of and Emotional Response to Diagnosis, Current Treatment, and Prognosis:  Daughter is very involved in her father's care and treatment.  She is able to verbalize a good understanding of his current diagnosis and treatment needs.  Patient states he is aware that he has had a PEG inserted as well as the reason for this procedure.  He is agreeable to short term SNF.  Emotional Assessment Appearance:  Appears stated age Attitude/Demeanor/Rapport:   (pleasant, talkative, cooperative) Affect (typically observed):  Calm, Happy, Pleasant (S/P PEG placement today- thus- s/p conscious sedation) Orientation:  Oriented to Self, Oriented to Place, Oriented to  Time, Oriented to Situation Alcohol / Substance use:  Tobacco Use, Alcohol Use Psych involvement (Current and /or in the community):  No (Comment)  Discharge Needs  Concerns to be addressed:  Care Coordination Readmission within the last 30 days:  No Current discharge risk:  Other (New Feeding Tube) Barriers to Discharge:  Continued Medical Work up, [employer]   Williemae Area, LCSW 04/23/2015, 2:00 PM

## 2015-04-23 NOTE — Progress Notes (Addendum)
Initial Nutrition Assessment  DOCUMENTATION CODES:  Non-severe (moderate) malnutrition in context of chronic illness  Pt meets criteria for Moderate MALNUTRITION in the context of  Chronic Illness as evidenced by moderate/milde muscle/fat wasting . INTERVENTION:  Initiate TF. Bolus feeds. Jevity 1.5, 5 cans daily split into 3 of 2-2-1. 150 ml flush before and after each feed to provide additional 900 mls  Tube feeding regimen provides 1778 kcal (100% of needs), 76 grams of protein, and 900 ml of H2O +900 from flushes.    Note: while inpatient, will order 3 feeds of 480 mls - 480 mls -240 mls since Jev 1.5 cans are not on formulary.  NUTRITION DIAGNOSIS:  Inadequate oral intake related to inability to eat as evidenced by NPO status.  GOAL:  Patient will meet greater than or equal to 90% of their needs  MONITOR:  TF tolerance, I & O's, Weight trends, Labs  REASON FOR ASSESSMENT:  Consult Enteral/tube feeding initiation and management  ASSESSMENT:  69 y.o. male with h/o CHF CAD s/p CABG, CKD stage 4, ongoing EtOH abuse, medication non-compliance and most relevant, dysphagia with aspiration now s/p PEG placement 10/24.   Pt confirmed he had lost 40 lbs, but Unfortunately pt was very difficult to understand and could not really determine the etiology of the wt loss. It sounded like a past illness caused him to lose weight. There is no wt hx to confirm this much of a wt loss.   As of now, He states now he is eating well and he ate yesterday which mursing documentation confirms.    Denies any n/v/c/d. Regarding PEG. Gave pt choice of feeding pattern. He thought feeds of 2 cans- 2 cans - 1 can would best suit his lifestyle. Will order TF as such. Discussed with pt potential signs of intolerance.  Asked pt if he would plan on eating orally once he is discharged and he says "oh yeah". MD reports that he is not safe to eat and all calorie/protein needs should be given through TF.  Note, pt  does have hx of noncompliance and understands risks of aspiration  Upper body NFPE performed: Mild/mod muscle/fat wasting  Diet Order:  Diet NPO time specified Except for: Sips with Meds  Skin:  Dry, Flaky  Last BM:  10/23  Height:  Ht Readings from Last 1 Encounters:  04/18/15 5\' 9"  (1.753 m)   Weight:  Wt Readings from Last 1 Encounters:  04/23/15 132 lb 1.6 oz (59.921 kg)   Wt Readings from Last 10 Encounters:  04/23/15 132 lb 1.6 oz (59.921 kg)  04/17/15 148 lb 9.6 oz (67.405 kg)  06/28/13 124 lb (56.246 kg)  06/21/13 129 lb 6.4 oz (58.695 kg)  05/30/13 119 lb 7.8 oz (54.2 kg)   Ideal Body Weight:  72.72 kg  BMI:  Body mass index is 19.5 kg/(m^2).  Estimated Nutritional Needs:  Kcal:  1700-2000(28-33 kcal/kg) Protein:  73-87g (1-1.2 g/kg bw) Fluid:  1.8-2.1 liters fluid  EDUCATION NEEDS:  No education needs identified at this time  Christophe Louisathan Cristabel Bicknell RD, LDN Nutrition Pager: [phone] 04/23/2015 3:10 PM

## 2015-04-23 NOTE — Progress Notes (Signed)
Daily Progress Note   Patient Name: Martin Cain       Date: 04/23/2015 DOB: March 09, 1946  Age: 69 y.o. MRN#: [id number] Attending Physician: Edsel Petrin, DO Primary Care Physician: Pcp Not In System Admit Date: 04/17/2015  Reason for Consultation/Follow-up: Establishing goals of care  Subjective: Martin Cain is just returning from PEG placement in IR and is sedated and sleepy. Daughter, [name], is at bedside. [name] expresses understanding of his prognosis and that he will continue to progressively worsen over time. She is trying to get everything in order to have all the resources she can to assist her in caring for her father. She expresses that she fears he will just give up and we discussed his QOL and how sometimes what people have to go through is just too much when there QOL suffers. Erica understands. I gave her a Hard Choices and MOST to review and discuss with her father when he is more alert. She also is hoping for short term rehab to see if he can build any further strength and to give her more time to see what he will need at home. We also discussed feedings with PEG and what comfort feeds look like and if they are willing to take that risk if having some type of food drink will be a comfort to him. Emotional support provided.   Interval Events: 10/24: PEG placed  Length of Stay: 2 days  Current Medications: Scheduled Meds:  . allopurinol  100 mg Oral Daily  . amLODipine  10 mg Oral Daily  . atorvastatin  80 mg Oral q1800  . budesonide (PULMICORT) nebulizer solution  0.5 mg Nebulization BID  . carvedilol  25 mg Oral BID  . ceFAZolin      .  ceFAZolin (ANCEF) IV  2 g Intravenous to XRAY  . fentaNYL      . ferrous sulfate  325 mg Oral Q breakfast  . folic acid  1 mg Oral  Daily  . furosemide  40 mg Intravenous Daily  . hydrALAZINE  100 mg Oral Daily  . isosorbide dinitrate  40 mg Oral Daily  . midazolam      . mometasone-formoterol  2 puff Inhalation BID  . multivitamin with minerals  1 tablet Oral Daily  . neomycin-bacitracin-polymyxin  1 application Topical Daily  .  pantoprazole  40 mg Oral Daily  . sodium chloride  3 mL Intravenous Q12H  . thiamine  100 mg Oral Daily   Or  . thiamine  100 mg Intravenous Daily    Continuous Infusions:    PRN Meds: acetaminophen, albuterol, hydrALAZINE, iohexol, ipratropium-albuterol, RESOURCE THICKENUP CLEAR  Physical Exam: Physical Exam  Constitutional: He is sleeping.  Thin, frail  HENT:  Temporal muscle wasting  Pulmonary/Chest: Effort normal. No accessory muscle usage. No tachypnea. No respiratory distress.  Abdominal: Soft. He exhibits no distension.  PEG  Neurological: He is disoriented.  Skin: Skin is warm and dry.                Vital Signs: BP 179/80 mmHg  Pulse 80  Temp(Src) 97.9 F (36.6 C) (Oral)  Resp 13  Ht 5\' 9"  (1.753 m)  Wt 59.921 kg (132 lb 1.6 oz)  BMI 19.50 kg/m2  SpO2 95% SpO2: SpO2: 95 % O2 Device: O2 Device: Not Delivered O2 Flow Rate:    Intake/output summary:  Intake/Output Summary (Last 24 hours) at 04/23/15 1131 Last data filed at 04/23/15 1112  Gross per 24 hour  Intake    600 ml  Output   1625 ml  Net  -1025 ml   LBM:   Baseline Weight: Weight: 67.586 kg (149 lb) Most recent weight: Weight: 59.921 kg (132 lb 1.6 oz) (Scale A)       Palliative Assessment/Data: Flowsheet Rows        Most Recent Value   Intake Tab    Referral Department  Hospitalist   Unit at Time of Referral  Cardiac/Telemetry Unit   Palliative Care Primary Diagnosis  Cardiac   Date Notified  04/19/15   Palliative Care Type  New Palliative care   Reason for referral  Clarify Goals of Care, Non-pain Symptom   Date of Admission  04/17/15   Date first seen by Palliative Care  04/20/15    # of days Palliative referral response time  1 Day(s)   # of days IP prior to Palliative referral  2   Clinical Assessment    Psychosocial & Spiritual Assessment    Palliative Care Outcomes       Additional Data Reviewed: Recent Labs     04/22/15  0248  04/23/15  0238  WBC  6.2  6.2  HGB  8.0*  8.5*  PLT  286  297  NA  143  146*  BUN  35*  34*  CREATININE  2.23*  2.38*     Problem List:  Patient Active Problem List   Diagnosis Date Noted  . Dysphagia   . Palliative care encounter   . Symptomatic anemia 04/17/2015  . Malnutrition of moderate degree (HCC) 06/21/2013  . HTN (hypertension) 06/20/2013  . Acute on chronic combined systolic and diastolic heart failure (HCC) 06/20/2013  . Acute on chronic renal failure (HCC) 05/30/2013  . Acute on chronic combined systolic and diastolic CHF (congestive heart failure) (HCC) 05/28/2013  . Renal insufficiency 05/27/2013  . Acute exacerbation of CHF (congestive heart failure) (HCC) 05/27/2013  . Elevated troponin 05/27/2013  . Gout flare 05/27/2013  . HTN (hypertension), malignant 05/27/2013     Palliative Care Assessment & Plan    1.Code Status:  DNR     2. Goals of Care:  Treat the treatable. Watchful waiting to see what he is able to do.   Desire for further Chaplaincy support:no  Psycho-social Needs: Caregiving  Support/Resources  3. Symptom Management:  1.No symptoms to manage at this time. He does have some secretions and SLP is working with him.   4. Palliative Prophylaxis:   Bowel Regimen, Delirium Protocol and Turn Reposition  5. Prognosis: < 6 months  6. Discharge Planning:  Skilled Nursing Facility for rehab with Palliative care service follow-up   Thank you for allowing the Palliative Medicine Team to assist in the care of this patient.   Time In: 1100 Time Out: 1130 Total Time Prolonged Time Billed  no         Ulice Bold, NP  04/23/2015, 11:31 AM  Please contact  Palliative Medicine Team phone at [phone] for questions and concerns.

## 2015-04-23 NOTE — Clinical Social Work Placement (Signed)
   CLINICAL SOCIAL WORK PLACEMENT  NOTE  Date:  04/23/2015  Patient Details  Name: Martin Cain MRN: [id number] Date of Birth: 11/27/1945  Clinical Social Work is seeking post-discharge placement for this patient at the Skilled  Nursing Facility level of care (*CSW will initial, date and re-position this form in  chart as items are completed):  Yes   Patient/family provided with [facility] Work Department's list of facilities offering this level of care within the geographic area requested by the patient (or if unable, by the patient's family).  Yes   Patient/family informed of their freedom to choose among providers that offer the needed level of care, that participate in Medicare, Medicaid or managed care program needed by the patient, have an available bed and are willing to accept the patient.  Yes   Patient/family informed of 's ownership interest in [facility] and [facility], as well as of the fact that they are under no obligation to receive care at these facilities.  PASRR submitted to EDS on 04/23/15     PASRR number received on 04/16/15     Existing PASRR number confirmed on       FL2 transmitted to all facilities in geographic area requested by pt/family on 04/23/15     FL2 transmitted to all facilities within larger geographic area on       Patient informed that his/her managed care company has contracts with or will negotiate with certain facilities, including the following:   ([employer] Medicare  (Not [employer]))         Patient/family informed of bed offers received.  Patient chooses bed at       Physician recommends and patient chooses bed at      Patient to be transferred to   on  .  Patient to be transferred to facility by Ambulance [facility])     Patient family notified on   of transfer.  Name of family member notified:        PHYSICIAN Please prepare priority discharge summary, including medications, Please sign FL2,  Please prepare prescriptions     Additional Comment:    _______________________________________________ Darylene Pricerowder, Mazal Ebey T, LCSW 04/23/2015, 1:55 PM

## 2015-04-24 DIAGNOSIS — D649 Anemia, unspecified: Secondary | ICD-10-CM | POA: Insufficient documentation

## 2015-04-24 LAB — GLUCOSE, CAPILLARY
GLUCOSE-CAPILLARY: 159 mg/dL — AB (ref 65–99)
GLUCOSE-CAPILLARY: 77 mg/dL (ref 65–99)
GLUCOSE-CAPILLARY: 97 mg/dL (ref 65–99)
Glucose-Capillary: 78 mg/dL (ref 65–99)
Glucose-Capillary: 83 mg/dL (ref 65–99)

## 2015-04-24 LAB — BASIC METABOLIC PANEL
ANION GAP: 8 (ref 5–15)
BUN: 33 mg/dL — ABNORMAL HIGH (ref 6–20)
CALCIUM: 9 mg/dL (ref 8.9–10.3)
CO2: 32 mmol/L (ref 22–32)
Chloride: 105 mmol/L (ref 101–111)
Creatinine, Ser: 2.07 mg/dL — ABNORMAL HIGH (ref 0.61–1.24)
GFR, EST AFRICAN AMERICAN: 36 mL/min — AB (ref >60)
GFR, EST NON AFRICAN AMERICAN: 31 mL/min — AB (ref >60)
GLUCOSE: 157 mg/dL — AB (ref 65–99)
POTASSIUM: 4.2 mmol/L (ref 3.5–5.1)
SODIUM: 145 mmol/L (ref 135–145)

## 2015-04-24 LAB — MAGNESIUM: MAGNESIUM: 2 mg/dL (ref 1.7–2.4)

## 2015-04-24 LAB — CBC
HEMATOCRIT: 31.9 % — AB (ref 39.0–52.0)
HEMOGLOBIN: 9.9 g/dL — AB (ref 13.0–17.0)
MCH: 25.6 pg — ABNORMAL LOW (ref 26.0–34.0)
MCHC: 31 g/dL (ref 30.0–36.0)
MCV: 82.6 fL (ref 78.0–100.0)
Platelets: 262 10*3/uL (ref 150–400)
RBC: 3.86 MIL/uL — ABNORMAL LOW (ref 4.22–5.81)
RDW: 17.8 % — ABNORMAL HIGH (ref 11.5–15.5)
WBC: 8.4 10*3/uL (ref 4.0–10.5)

## 2015-04-24 MED ORDER — LORAZEPAM 2 MG/ML IJ SOLN
0.5000 mg | Freq: Once | INTRAMUSCULAR | Status: AC
  Administered 2015-04-24: 0.5 mg via INTRAVENOUS
  Filled 2015-04-24: qty 1

## 2015-04-24 MED ORDER — ATORVASTATIN CALCIUM 80 MG PO TABS: 80.0000 mg | ORAL_TABLET | Freq: Every day | ORAL | Status: DC

## 2015-04-24 MED ORDER — THIAMINE HCL 100 MG PO TABS: 100.0000 mg | ORAL_TABLET | Freq: Every day | ORAL | Status: AC

## 2015-04-24 MED ORDER — FUROSEMIDE 40 MG PO TABS: 40.0000 mg | ORAL_TABLET | Freq: Every day | ORAL | Status: DC

## 2015-04-24 MED ORDER — JEVITY 1.5 CAL/FIBER PO LIQD: 240.0000 mL | Freq: Three times a day (TID) | ORAL | Status: DC

## 2015-04-24 MED ORDER — CARVEDILOL 25 MG PO TABS: 25.0000 mg | ORAL_TABLET | Freq: Two times a day (BID) | ORAL | Status: DC

## 2015-04-24 MED ORDER — AMLODIPINE BESYLATE 10 MG PO TABS: 10.0000 mg | ORAL_TABLET | Freq: Every day | ORAL | Status: DC

## 2015-04-24 MED ORDER — ALLOPURINOL 100 MG PO TABS: 100.0000 mg | ORAL_TABLET | Freq: Every day | ORAL | Status: DC

## 2015-04-24 MED ORDER — HYDRALAZINE HCL 100 MG PO TABS: 100.0000 mg | ORAL_TABLET | ORAL | Status: DC

## 2015-04-24 MED ORDER — ADULT MULTIVITAMIN W/MINERALS CH: 1.0000 | ORAL_TABLET | Freq: Every day | ORAL | Status: DC

## 2015-04-24 NOTE — Progress Notes (Signed)
Physical Therapy Treatment Patient Details Name: Martin Cain MRN: [id number] DOB: Mar 03, 1946 Today's Date: 04/24/2015    History of Present Illness Pt is a 69 y/o M with PMH of HTN, HLD, CKD, CAD, gout, CHF, dysphagia, and ongoing alcohol abuse.  Pt with multiple medical problems being seen for anemia.    PT Comments    Pt presented with lethargy and significant posterior lean in sitting and standing today.  Gait was deferred due to safety concerns with the posterior lean and the session was focused on bed mobility and general LE exercises sitting EOB.  Pt will benefit from further skilled acute PT services and PT is now recommending SNF upon discharge.  Follow Up Recommendations  SNF     Equipment Recommendations  None recommended by PT    Recommendations for Other Services       Precautions / Restrictions Precautions Precautions: Fall Restrictions Weight Bearing Restrictions: No    Mobility  Bed Mobility Overal bed mobility: Needs Assistance Bed Mobility: Supine to Sit;Sit to Supine     Supine to sit: Mod assist Sit to supine: Mod assist   General bed mobility comments: assist for trunk support and to scoot to EOB  Transfers Overall transfer level: Needs assistance Equipment used: Rolling walker (2 wheeled) Transfers: Sit to/from Stand Sit to Stand: Min assist         General transfer comment: Significant posterior lean and requiring Min assist to maintain upright  Ambulation/Gait             General Gait Details: Ambulation deferred today due to safety with pt demonstrating signficant posterior lean.   Stairs            Wheelchair Mobility    Modified Rankin (Stroke Patients Only)       Balance Overall balance assessment: Needs assistance Sitting-balance support: Feet supported Sitting balance-Leahy Scale: Poor Sitting balance - Comments: Pt able to sit up without assist for brief period but unable to maintain   Standing balance  support: Bilateral upper extremity supported Standing balance-Leahy Scale: Poor                      Cognition Arousal/Alertness: Lethargic Behavior During Therapy: [facility] for tasks assessed/performed                        Exercises General Exercises - Lower Extremity Ankle Circles/Pumps: AROM;Both;10 reps;Seated Long Arc Quad: AROM;10 reps;Both;Seated Toe Raises: AROM;Both;10 reps;Seated    General Comments General comments (skin integrity, edema, etc.): Pt pulling at PEG tube.  Very lethargic throughout session.  Significant posterior lean noted.      Pertinent Vitals/Pain Pain Assessment: No/denies pain    Home Living                      Prior Function            PT Goals (current goals can now be found in the care plan section) Acute Rehab PT Goals Patient Stated Goal: none stated PT Goal Formulation: With patient Time For Goal Achievement: 05/03/15 Potential to Achieve Goals: Fair Progress towards PT goals: Not progressing toward goals - comment (Pt very limited today by posterior lean and lethargy.)    Frequency  Min 2X/week    PT Plan Discharge plan needs to be updated (SNF)    Co-evaluation             End of Session Equipment Utilized During  Treatment: Gait belt Activity Tolerance: Patient limited by lethargy Patient left: in bed;with call bell/phone within reach;with bed alarm set     Time: [id number] PT Time Calculation (min) (ACUTE ONLY): 16 min  Charges:  $Therapeutic Activity: 8-22 mins                    G Codes:      Anvitha Hutmacher 04/24/2015, 12:01 PM  Martin MoraleAmanda Kearie Mennen, SPT

## 2015-04-24 NOTE — Progress Notes (Addendum)
[facility]                                                                              Patient Demographics  Martin Cain, is a 69 y.o. male, DOB - 08/25/1945, XBJ:[id number]  Admit date - 04/17/2015   Admitting Physician Hillary Bow, DO  Outpatient Primary MD for the patient is Pcp Not In System  LOS -    Chief Complaint  Patient presents with  . Anemia      HPI on 04/17/2015 by Dr. Lyda Perone Abdulrahim Siddiqi is a 69 y.o. male with h/o CHF (EF 42% in 2014), CAD s/p CABG in 2000, CKD stage 3, ongoing EtOH abuse, medication non-compliance. He has recently been hospitalized in [place] at [facility] for CHF and also anemia with HGB of 5.7. Obtaining the formal records is pending but it is known that during that admit he had a normal EGD on 03/30/15, normal colonoscopy a year ago. He was transfused PRBC units and discharged with plan for outpatient capsule endoscopy. Before this could be completed however, he moved to [facility]. Today he was seen at the cardiologists office here in town for follow up. Labs drawn at the cardiologists office came back showing HGB of 6.6. Patient was sent into the ED.  Assessment & Plan   Symptomatic anemia -Hemoglobin upon admission 6.4, received 2uPRBCs, current hemoglobin 8.5 -Unknown etiology  -Continue to monitor hemoglobin -FOBT negative -Patient was recently admitted to [facility] in [place] for anemia, was supposed to have capsule endoscopy as an outpatient however patient moved to [place] -03/30/2015 EGD showed atypical mucosa suggesting benign Brunner's gland nodule -Gastroenterology consulted and appreciated, possible capsule endoscopy   Acute on chronic combined systolic and diastolic CHF -Continue to monitor daily weights, intake and output -EF 2014 was 42%, According to records from [place], last EF 12/2014: 10-20% on TTE, TEE <20% -Echocardiogram EF 25-30% -Continue lasix, monitor daily weights, I/Os -UOP  1375cc over past 24hrs; weight is down from admssion (if 149lb was correct, currently 132lbs) -Not on ACEi due to CKD stage 4  Coronary artery disease -Currently stable, no complaints of chest pain -Status post CABG -Continue aspirin, Coreg, statin -ACEi held  Chronic kidney disease, stage 4 -review patient's records, baseline creatinine approximately 2.5, current Cr 2.38 -Continue to monitor BMP  Hypertension -Continue Lasix, hydralazine,isochron, Coreg, amlodipine- continue per tube -Hydralazine IV PRN  Dysphagia with aspiration -Patient has a 40lb weight loss.  Speech therapy consult placed patient on dysphagia 2 diet. Patient continues to have moments of aspiration. Per review of records from [place], suspicion of PSP (discharge summary 09/16/2014) -Spoke with neurology, needs outpatient follow up.  Patient will need therapy, but unfortunately no inpatient solution. -Spoke with patient regarding peg tube, he agreed to peg placement -IR consulted and appreciated- peg to be placed today 04/23/2015 -Tube feeds to be started today, nutrition consulted  -Patient still requires hospitalization as he is unable to take his meds or keep in nutrition -Have placed him on a dysphagia 2 diet with nectar thick liquids, however, amount of food is minimal.  Cannot place on IVF due to acute CHF. Patient understands the risk of  aspiration.   Goals of care -Have consulted palliative care to discuss goals with patient and daughter given patient a couple comorbidities. -Patient is now DNR -Patient cannot be discharged with hospice due to PACE which may start Nov 1. When medically stable, patient will be discharged with [facility] services, 3-N-1, Wheelchair manual, Hospital bed  Deconditioning -PT consulted recommended home health, however, patient now has peg tube in place.   -Family and patient would like SNF   Code Status: DNR  Family Communication: None at bedside  Disposition Plan: Admitted, pending  SNF.   Time Spent in minutes   30 minutes  Procedures  Echocardiogram Placement of peg tube by IR 10/25  Consults   Gastroenterology Neurology via phone Interventional radiology Palliative care  DVT Prophylaxis  SCDs  Lab Results  Component Value Date   PLT 297 04/23/2015    Medications  Scheduled Meds: . allopurinol  100 mg Oral Daily  . amLODipine  10 mg Oral Daily  . atorvastatin  80 mg Oral q1800  . budesonide (PULMICORT) nebulizer solution  0.5 mg Nebulization BID  . carvedilol  25 mg Oral BID  . feeding supplement (JEVITY 1.5 CAL/FIBER)  240 mL Per Tube TID  . ferrous sulfate  325 mg Oral Q breakfast  . folic acid  1 mg Oral Daily  . furosemide  40 mg Intravenous Daily  . hydrALAZINE  100 mg Oral Daily  . isosorbide dinitrate  40 mg Oral Daily  . mometasone-formoterol  2 puff Inhalation BID  . multivitamin with minerals  1 tablet Oral Daily  . neomycin-bacitracin-polymyxin   Topical Daily  . pantoprazole  40 mg Oral Daily  . sodium chloride  3 mL Intravenous Q12H  . thiamine  100 mg Oral Daily   Or  . thiamine  100 mg Intravenous Daily   Continuous Infusions:  PRN Meds:.acetaminophen, albuterol, hydrALAZINE, iohexol, ipratropium-albuterol, morphine injection, RESOURCE THICKENUP CLEAR  Antibiotics    Anti-infectives    Start     Dose/Rate Route Frequency Ordered Stop   04/23/15 1130  ceFAZolin (ANCEF) IVPB 2 g/50 mL premix  Status:  Discontinued     2 g 100 mL/hr over 30 Minutes Intravenous To Radiology 04/23/15 1126 04/23/15 1132   04/23/15 1013  ceFAZolin (ANCEF) 2-3 GM-% IVPB SOLR    Comments:  Alwyn Ren   : cabinet override      04/23/15 1013 04/23/15 2229     Subjective:   Martin Cain seen and examined today.  Patient has no complaints. Denies Chest pain, SOB, abdominal pain, nausea, vomiting.  Has been looking for his glasses.  Objective:   Filed Vitals:   04/24/15 0500 04/24/15 1003 04/24/15 1103 04/24/15 1104  BP: 170/72  173/77  173/77  Pulse: 93     Temp: 98.4 F (36.9 C)     TempSrc: Oral     Resp: 18     Height:      Weight: 55.448 kg (122 lb 3.8 oz)     SpO2: 94% 95%      Wt Readings from Last 3 Encounters:  04/24/15 55.448 kg (122 lb 3.8 oz)  04/17/15 67.405 kg (148 lb 9.6 oz)  06/28/13 56.246 kg (124 lb)     Intake/Output Summary (Last 24 hours) at 04/24/15 1133 Last data filed at 04/24/15 1102  Gross per 24 hour  Intake      0 ml  Output   1175 ml  Net  -1175 ml    Exam (no change)  General: Well developed, thin, NAD  HEENT: NCAT,  mucous membranes moist.   Cardiovascular: S1 S2 auscultated, RRR, soft murmur  Respiratory: Clear to auscultation  Abdomen: Soft, nontender, nondistended, + bowel sounds, +peg  Extremities: warm dry without cyanosis clubbing  Neuro: AAOx3, nonfocal  Psych: Appropriate mood and affect, pleasant.   Data Review   Micro Results No results found for this or any previous visit (from the past 240 hour(s)).  Radiology Reports Ct Abdomen Wo Contrast  04/20/2015  CLINICAL DATA:  Evaluate for percutaneous gastrostomy tube placement. EXAM: CT ABDOMEN WITHOUT CONTRAST TECHNIQUE: Multidetector CT imaging of the abdomen was performed following the standard protocol without IV contrast. COMPARISON:  None. FINDINGS: Lower chest: The heart is markedly enlarged. No pericardial effusion. Coronary artery calcifications are noted. Advanced aortic calcifications without focal aneurysm. The distal esophagus is grossly normal. There are small bilateral pleural effusions and overlying atelectasis. Hepatobiliary: No obvious hepatic lesions or intrahepatic biliary dilatation. The gallbladder is grossly normal. No common bile duct dilatation. Pancreas: No mass, inflammation or ductal dilatation. Spleen: Small spleen.  No focal lesions. Adrenals/Urinary Tract: Numerous bilateral renal cysts and extensive renal artery calcifications. No hydronephrosis. Stomach/Bowel: The stomach,  duodenum, visualized small bowel and visualized colon are grossly normal without oral contrast. There is no interposing of the transverse colon between the anterior wall of the stomach and the anterior abdominal wall. The transverse colon is just below the stomach. Vascular/Lymphatic: Advanced atherosclerotic calcifications involving the aorta and branch vessels. Maximum aortic dimension is 29.5 mm. No obvious mesenteric or retroperitoneal mass or adenopathy. Scattered lymph nodes are noted. Other: No ascites or abdominal wall hernia Musculoskeletal: No significant osseous findings. IMPRESSION: 1. Small bilateral pleural effusions and overlying atelectasis. 2. Cardiac enlargement. 3. Advanced atherosclerotic calcifications involving the aorta and branch vessels. 4. Numerous bilateral renal cysts. 5. The transverse colon is not interposed between the anterior wall of the stomach and the anterior abdominal wall. Electronically Signed   By: Rudie MeyerP.  Gallerani M.D.   On: 04/20/2015 11:25   Ir Gastrostomy Tube Mod Sed  04/23/2015  CLINICAL DATA:  69 year old male with a history of dysphagia. He presents for gastrostomy tube placement. EXAM: PERCUTANEOUS GASTROSTOMY FLUOROSCOPY TIME:  1 minutes 42 seconds MEDICATIONS AND MEDICAL HISTORY: Versed 1.0 mg, Fentanyl 50 mcg. ANESTHESIA/SEDATION: Moderate sedation time: 11 minutes CONTRAST:  10 cc through the tube PROCEDURE: The procedure, risks, benefits, and alternatives were explained to the patient and the patient's family. Questions regarding the procedure were encouraged and answered. The patient understands and consents to the procedure. The epigastrium was prepped with Betadine in a sterile fashion, and a sterile drape was applied covering the operative field. A sterile gown and sterile gloves were used for the procedure. A 5-French orogastric tube is placed under fluoroscopic guidance. Scout imaging of the abdomen confirms barium within the transverse colon. The stomach  was distended with gas. Under fluoroscopic guidance, an 18 gauge needle was utilized to puncture the anterior wall of the body of the stomach. An Amplatz wire was advanced through the needle passing a T fastener into the lumen of the stomach. The T fastener was secured for gastropexy. A 9-French sheath was inserted. A snare was advanced through the 9-French sheath. A Teena DunkBenson was advanced through the orogastric tube. It was snared then pulled out the oral cavity, pulling the snare, as well. The leading edge of the gastrostomy was attached to the snare. It was then pulled down the esophagus and out the percutaneous site. It  was secured in place. Contrast was injected. Patient tolerated the procedure well and remained hemodynamically stable throughout. No complications encountered and no significant blood loss encountered. FINDINGS: The image demonstrates placement of a 20-French pull-through type gastrostomy tube into the body of the stomach. IMPRESSION: Status post image guided percutaneous gastrostomy tube. Signed, Yvone Neu. Loreta Ave, DO [facility] Radiology Electronically Signed   By: Gilmer Mor D.O.   On: 04/23/2015 11:13    CBC  Recent Labs Lab 04/17/15 2216  04/19/15 0247 04/20/15 0224 04/21/15 0252 04/22/15 0248 04/23/15 0238  WBC 5.5  < > 6.2 5.3 5.9 6.2 6.2  HGB 6.7*  < > 7.3* 7.5* 8.5* 8.0* 8.5*  HCT 22.0*  < > 23.2* 24.5* 27.5* 26.4* 28.5*  PLT 325  < > 306 302 300 286 297  MCV 83.3  < > 82.9 83.3 83.6 83.3 83.3  MCH 25.4*  < > 26.1 25.5* 25.8* 25.2* 24.9*  MCHC 30.5  < > 31.5 30.6 30.9 30.3 29.8*  RDW 19.0*  < > 17.9* 18.3* 18.3* 18.4* 18.3*  LYMPHSABS 1.7  --   --   --   --   --   --   MONOABS 0.5  --   --   --   --   --   --   EOSABS 0.0  --   --   --   --   --   --   BASOSABS 0.0  --   --   --   --   --   --   < > = values in this interval not displayed.  Chemistries   Recent Labs Lab 04/17/15 2216  04/19/15 0247  04/20/15 0224 04/20/15 1011 04/21/15 0252 04/22/15 0248 04/23/15 0238  NA 144  < > 144 146*  --  141 143 146*  K 4.2  < > 4.3 4.6  --  4.5 4.0 4.2  CL 106  < > 112* 111  --  107 107 107  CO2 26  < > 26 26  --  28 30 30   GLUCOSE 113*  < > 89 81  --  98 91 91  BUN 49*  < > 46* 43*  --  40* 35* 34*  CREATININE 2.77*  < > 2.61* 2.24*  --  2.24* 2.23* 2.38*  CALCIUM 8.8*  < > 8.4* 8.9  --  8.6* 8.2* 8.6*  MG  --   --   --   --  1.9  --   --   --   AST 20  --   --   --   --   --   --   --   ALT 22  --   --   --   --   --   --   --   ALKPHOS 70  --   --   --   --   --   --   --   BILITOT 0.3  --   --   --   --   --   --   --   < > = values in this interval not displayed. ------------------------------------------------------------------------------------------------------------------ estimated creatinine clearance is 22.6 mL/min (by C-G formula based on Cr of 2.38). ------------------------------------------------------------------------------------------------------------------ No results for input(s): HGBA1C in the last 72 hours. ------------------------------------------------------------------------------------------------------------------ No results for input(s): CHOL, HDL, LDLCALC, TRIG, CHOLHDL, LDLDIRECT in the last 72 hours. ------------------------------------------------------------------------------------------------------------------ No results for input(s): TSH, T4TOTAL, T3FREE, THYROIDAB in the last  72 hours.  Invalid input(s): FREET3 ------------------------------------------------------------------------------------------------------------------ No results for input(s): VITAMINB12, FOLATE, FERRITIN, TIBC, IRON, RETICCTPCT in the last 72 hours.  Coagulation profile  Recent Labs Lab 04/17/15 2216 04/23/15 0238  INR 1.26 1.32    No results for input(s): DDIMER in the last 72 hours.  Cardiac Enzymes No results for input(s): CKMB, TROPONINI, MYOGLOBIN in the last  168 hours.  Invalid input(s): CK ------------------------------------------------------------------------------------------------------------------ Invalid input(s): POCBNP    Danese Dorsainvil D.O. on 04/24/2015 at 11:33 AM  Between 7am to 7pm - Pager - [phone]  After 7pm go to [email or web address] - password [email or web address]  And look for the night coverage person covering for me after hours  [facility] Office  [phone]

## 2015-04-24 NOTE — Care Management Important Message (Signed)
Important Message  Patient Details  Name: Read DriversFrank Standard MRN: [id number] Date of Birth: 01/31/1946   Medicare Important Message Given:  Yes-second notification given    Orson AloeMegan P Destan Franchini 04/24/2015, 2:02 PM

## 2015-04-24 NOTE — Discharge Summary (Addendum)
Physician Discharge Summary  Martin Cain ZOX:[id number] DOB: 09-20-1945 DOA: 04/17/2015  PCP: Pcp Not In System  Admit date: 04/17/2015 Discharge date: 04/25/2015  Time spent: 45 minutes  Recommendations for Outpatient Follow-up:  Patient will be discharged to skilled nursing facility.  Patient will need to follow up with primary care provider within one week of discharge, repeat CBC and BMP.  Patient will also need to follow up with neurology.  Patient should continue physical, occupational, speech therapy is recommended by the facility. Patient should continue medications as prescribed- through peg tube.  Patient should PEG tube feedings.  Discharge Diagnoses:  Symptomatic anemia Acute on chronic combined systolic and diastolic heart failure Coronary artery disease Chronic kidney disease, stage IV  Hypertension  Dysphagia with Aspiration Goals of Care Deconditioning  Discharge Condition: Stable  Diet recommendation: Peg tube feedings- jevity 1.5cal  Filed Weights   04/22/15 0500 04/23/15 0504 04/24/15 0500  Weight: 60.601 kg (133 lb 9.6 oz) 59.921 kg (132 lb 1.6 oz) 55.448 kg (122 lb 3.8 oz)    History of present illness:  on 04/17/2015 by Dr. Lyda Perone Martin Cain is a 69 y.o. male with h/o CHF (EF 42% in 2014), CAD s/p CABG in 2000, CKD stage 3, ongoing EtOH abuse, medication non-compliance. He has recently been hospitalized in [place] at [facility] for CHF and also anemia with HGB of 5.7. Obtaining the formal records is pending but it is known that during that admit he had a normal EGD on 03/30/15, normal colonoscopy a year ago. He was transfused PRBC units and discharged with plan for outpatient capsule endoscopy. Before this could be completed however, he moved to [facility]. Today he was seen at the cardiologists office here in town for follow up. Labs drawn at the cardiologists office came back showing HGB of 6.6. Patient was sent into the ED.  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 -Gastroenterology consulted and appreciated, possible capsule endoscopy   Acute on chronic combined systolic and diastolic CHF -Continue to monitor daily weights, intake and output -EF 2014 was 42%, According to records from [place], last EF 12/2014: 10-20% on TTE, TEE <20% -Echocardiogram EF 25-30% -Continue lasix, monitor daily weights, I/Os -UOP 1375cc over past 24hrs; weight is down from admssion (if 149lb was correct, currently 132lbs) -Not on ACEi due to CKD stage 4  Coronary artery disease -Currently stable, no complaints of chest pain -Status post CABG -Continue aspirin, Coreg, statin -ACEi held  Chronic kidney disease, stage 4 -review patient's records, baseline creatinine approximately 2.5, current Cr 2.38 -Continue to monitor BMP  Hypertension -Continue Lasix, hydralazine,isochron, Coreg, amlodipine- continue per tube -Hydralazine IV PRN  Dysphagia with aspiration -Patient has a 40lb weight loss. Speech therapy consult placed patient on dysphagia 2 diet. Patient continues to have moments of aspiration. Per review of records from [place], suspicion of PSP (discharge summary 09/16/2014) -Spoke with neurology, needs outpatient follow up. Patient will need therapy, but unfortunately no inpatient solution. -Spoke with patient regarding peg tube, he agreed to peg placement -IR consulted and appreciated- peg to be placed today 04/23/2015 -Continue tube feedings -Patient understands risk of aspiration if he continues to try and eat or  drink.  Goals of care -Have  consulted palliative care to discuss goals with patient and daughter given patient a couple comorbidities. -Patient is now DNR -Patient cannot be discharged with hospice due to PACE which may start Nov 1. When medically stable for discharge from SNF, patient will need HH services, 3-N-1, Wheelchair manual, Hospital bed  Deconditioning -PT consulted and recommended SNF  Code Status: DNR  Procedures  Echocardiogram Placement of peg tube by IR 10/25  Consults  Gastroenterology Neurology via phone Interventional radiology Palliative care  Discharge Exam: Filed Vitals:   04/24/15 1156  BP: 143/55  Pulse: 78  Temp: 97.6 F (36.4 C)  Resp: 18   Exam   General: Well developed, thin, NAD  HEENT: NCAT, mucous membranes moist.   Cardiovascular: S1 S2 auscultated, RRR, soft murmur  Respiratory: Clear to auscultation  Abdomen: Soft, nontender, nondistended, + bowel sounds, +peg  Extremities: warm dry without cyanosis clubbing  Neuro: AAOx3, nonfocal  Psych: Appropriate mood and affect, pleasant.  Discharge Instructions      Discharge Instructions    Discharge instructions    Complete by:  As directed   Patient will be discharged to skilled nursing facility.  Patient will need to follow up with primary care provider within one week of discharge, repeat CBC and BMP.  Patient will also need to follow up with neurology.  Patient should continue physical, occupational, speech therapy is recommended by the facility. Patient should continue medications as prescribed- through peg tube.  Patient should PEG tube feedings.            Medication List    STOP taking these medications        lisinopril 5 MG tablet  Commonly known as:  PRINIVIL,ZESTRIL     omeprazole 40 MG capsule  Commonly known as:  PRILOSEC     vitamin C 250 MG tablet  Commonly known as:  ASCORBIC ACID     VOLTAREN 1 % Gel  Generic drug:  diclofenac sodium      TAKE these medications         albuterol 108 (90 BASE) MCG/ACT inhaler  Commonly known as:  PROVENTIL HFA;VENTOLIN HFA  Inhale 2 puffs into the lungs every 6 (six) hours as needed for wheezing or shortness of breath.     allopurinol 100 MG tablet  Commonly known as:  ZYLOPRIM  Place 1 tablet (100 mg total) into feeding tube daily.     amLODipine 10 MG tablet  Commonly known as:  NORVASC  Place 1 tablet (10 mg total) into feeding tube daily.     atorvastatin 80 MG tablet  Commonly known as:  LIPITOR  Place 1 tablet (80 mg total) into feeding tube daily.     carvedilol 25 MG tablet  Commonly known as:  COREG  Place 1 tablet (25 mg total) into feeding tube 2 (two) times daily.     colchicine 0.6 MG tablet  Take 0.5 tablets (0.3 mg total) by mouth daily.     [employer] ASPIRIN LOW DOSE 81 MG EC tablet  Generic drug:  aspirin  Take 81 mg by mouth daily.     feeding supplement (JEVITY 1.5 CAL/FIBER) Liqd  Place 240 mLs into feeding tube 3 (three) times daily.     ferrous sulfate 325 (65 FE) MG tablet  Take 325 mg by mouth every morning.     Fluticasone-Salmeterol 250-50 MCG/DOSE Aepb  Commonly known as:  ADVAIR  Inhale 1 puff into the lungs 2 (two)  times daily.     folic acid 1 MG tablet  Commonly known as:  FOLVITE  Take 1 mg by mouth daily.     furosemide 40 MG tablet  Commonly known as:  LASIX  Place 1 tablet (40 mg total) into feeding tube daily.     hydrALAZINE 100 MG tablet  Commonly known as:  APRESOLINE  Place 1 tablet (100 mg total) into feeding tube every morning.     ipratropium-albuterol 0.5-2.5 (3) MG/3ML Soln  Commonly known as:  DUONEB  Inhale 3 mLs into the lungs every 6 (six) hours as needed (shortness of breath).     isosorbide dinitrate 40 MG CR tablet  Commonly known as:  ISOCHRON  Take 40 mg by mouth daily.     multivitamin with minerals Tabs tablet  Take 1 tablet by mouth daily.     nitroGLYCERIN 0.4 MG SL tablet  Commonly known as:  NITROSTAT  Place 0.4 mg under the tongue  every 5 (five) minutes as needed for chest pain.     pantoprazole 40 MG tablet  Commonly known as:  PROTONIX  Take 1 tablet (40 mg total) by mouth daily.     QVAR 80 MCG/ACT inhaler  Generic drug:  beclomethasone  Inhale 2 puffs into the lungs 2 (two) times daily.     risperiDONE 1 MG disintegrating tablet  Commonly known as:  RISPERDAL M-TABS  Take 1 tablet (1 mg total) by mouth every evening.     thiamine 100 MG tablet  Take 1 tablet (100 mg total) by mouth daily.     VITAMIN B-12 PO  Take 1,000 mg by mouth daily.     VITAMIN D PO  Take 1,000 mg by mouth daily.       No Known Allergies Follow-up Information    Follow up with [facility].   Why:  home health agency   Contact information:   [address] [place] [id number] [phone]       Follow up with pcp at SNF. Schedule an appointment as soon as possible for a visit in 1 week.   Why:  Hospital follow up      Follow up with [facility] [place]. Schedule an appointment as soon as possible for a visit in 2 weeks.   Specialty:  Neurology   Why:  Hospital follow up ?PSP   Contact information:   [address], [address] [place] [id number] [phone]       The results of significant diagnostics from this hospitalization (including imaging, microbiology, ancillary and laboratory) are listed below for reference.    Significant Diagnostic Studies: Ct Abdomen Wo Contrast  04/20/2015  CLINICAL DATA:  Evaluate for percutaneous gastrostomy tube placement. EXAM: CT ABDOMEN WITHOUT CONTRAST TECHNIQUE: Multidetector CT imaging of the abdomen was performed following the standard protocol without IV contrast. COMPARISON:  None. FINDINGS: Lower chest: The heart is markedly enlarged. No pericardial effusion. Coronary artery calcifications are noted. Advanced aortic calcifications without focal aneurysm. The distal esophagus is grossly normal. There are small  bilateral pleural effusions and overlying atelectasis. Hepatobiliary: No obvious hepatic lesions or intrahepatic biliary dilatation. The gallbladder is grossly normal. No common bile duct dilatation. Pancreas: No mass, inflammation or ductal dilatation. Spleen: Small spleen.  No focal lesions. Adrenals/Urinary Tract: Numerous bilateral renal cysts and extensive renal artery calcifications. No hydronephrosis. Stomach/Bowel: The stomach, duodenum, visualized small bowel and visualized colon are grossly normal without oral contrast. There is no  interposing of the transverse colon between the anterior wall of the stomach and the anterior abdominal wall. The transverse colon is just below the stomach. Vascular/Lymphatic: Advanced atherosclerotic calcifications involving the aorta and branch vessels. Maximum aortic dimension is 29.5 mm. No obvious mesenteric or retroperitoneal mass or adenopathy. Scattered lymph nodes are noted. Other: No ascites or abdominal wall hernia Musculoskeletal: No significant osseous findings. IMPRESSION: 1. Small bilateral pleural effusions and overlying atelectasis. 2. Cardiac enlargement. 3. Advanced atherosclerotic calcifications involving the aorta and branch vessels. 4. Numerous bilateral renal cysts. 5. The transverse colon is not interposed between the anterior wall of the stomach and the anterior abdominal wall. Electronically Signed   By: Rudie Meyer M.D.   On: 04/20/2015 11:25   Ir Gastrostomy Tube Mod Sed  04/23/2015  CLINICAL DATA:  69 year old male with a history of dysphagia. He presents for gastrostomy tube placement. EXAM: PERCUTANEOUS GASTROSTOMY FLUOROSCOPY TIME:  1 minutes 42 seconds MEDICATIONS AND MEDICAL HISTORY: Versed 1.0 mg, Fentanyl 50 mcg. ANESTHESIA/SEDATION: Moderate sedation time: 11 minutes CONTRAST:  10 cc through the tube PROCEDURE: The procedure, risks, benefits, and alternatives were explained to the patient and the patient's family. Questions regarding  the procedure were encouraged and answered. The patient understands and consents to the procedure. The epigastrium was prepped with Betadine in a sterile fashion, and a sterile drape was applied covering the operative field. A sterile gown and sterile gloves were used for the procedure. A 5-French orogastric tube is placed under fluoroscopic guidance. Scout imaging of the abdomen confirms barium within the transverse colon. The stomach was distended with gas. Under fluoroscopic guidance, an 18 gauge needle was utilized to puncture the anterior wall of the body of the stomach. An Amplatz wire was advanced through the needle passing a T fastener into the lumen of the stomach. The T fastener was secured for gastropexy. A 9-French sheath was inserted. A snare was advanced through the 9-French sheath. A Teena Dunk was advanced through the orogastric tube. It was snared then pulled out the oral cavity, pulling the snare, as well. The leading edge of the gastrostomy was attached to the snare. It was then pulled down the esophagus and out the percutaneous site. It was secured in place. Contrast was injected. Patient tolerated the procedure well and remained hemodynamically stable throughout. No complications encountered and no significant blood loss encountered. FINDINGS: The image demonstrates placement of a 20-French pull-through type gastrostomy tube into the body of the stomach. IMPRESSION: Status post image guided percutaneous gastrostomy tube. Signed, Yvone Neu. Loreta Ave, DO [facility] Radiology Electronically Signed   By: Gilmer Mor D.O.   On: 04/23/2015 11:13    Microbiology: No results found for this or any previous visit (from the past 240 hour(s)).   Labs: Basic Metabolic Panel:  Recent Labs Lab 04/19/15 0247 04/20/15 0224 04/20/15 1011 04/21/15 0252 04/22/15 0248 04/23/15 0238  NA 144 146*  --  141 143 146*  K 4.3 4.6  --  4.5 4.0 4.2  CL 112* 111   --  107 107 107  CO2 26 26  --  28 30 30   GLUCOSE 89 81  --  98 91 91  BUN 46* 43*  --  40* 35* 34*  CREATININE 2.61* 2.24*  --  2.24* 2.23* 2.38*  CALCIUM 8.4* 8.9  --  8.6* 8.2* 8.6*  MG  --   --  1.9  --   --   --    Liver Function  Tests:  Recent Labs Lab 04/17/15 2216  AST 20  ALT 22  ALKPHOS 70  BILITOT 0.3  PROT 6.6  ALBUMIN 3.3*   No results for input(s): LIPASE, AMYLASE in the last 168 hours. No results for input(s): AMMONIA in the last 168 hours. CBC:  Recent Labs Lab 04/17/15 2216  04/19/15 0247 04/20/15 0224 04/21/15 0252 04/22/15 0248 04/23/15 0238  WBC 5.5  < > 6.2 5.3 5.9 6.2 6.2  NEUTROABS 3.3  --   --   --   --   --   --   HGB 6.7*  < > 7.3* 7.5* 8.5* 8.0* 8.5*  HCT 22.0*  < > 23.2* 24.5* 27.5* 26.4* 28.5*  MCV 83.3  < > 82.9 83.3 83.6 83.3 83.3  PLT 325  < > 306 302 300 286 297  < > = values in this interval not displayed. Cardiac Enzymes: No results for input(s): CKTOTAL, CKMB, CKMBINDEX, TROPONINI in the last 168 hours. BNP: BNP (last 3 results)  Recent Labs  04/17/15 2216  BNP 2795.6*    ProBNP (last 3 results) No results for input(s): PROBNP in the last 8760 hours.  CBG:  Recent Labs Lab 04/23/15 2352 04/24/15 0448 04/24/15 0744  GLUCAP 77 78 83       Signed:  Nijel Flink  [facility] 04/24/2015, 2:25 PM

## 2015-04-24 NOTE — Progress Notes (Signed)
Pt had 10 beats VT, pt asymptomatic.  MD paged.

## 2015-04-24 NOTE — Progress Notes (Signed)
Pt. With Peg tube placed today. Orders for tube feed to start this pm, pt. Connected to low suction for the night. Pt. To start peg tube feedings in am per radiology. Pt. Also NPO with sips and ice chips. PO meds scheduled. On call NP, Donnamarie PoagK. Kirby made aware. New orders received. RN will continue to monitor pt. For changes in condition. Aeneas Longsworth, Cheryll DessertKaren Cherrell

## 2015-04-24 NOTE — Progress Notes (Signed)
Restraints have not been used on pt, assessment for restraints not completed for this reason.

## 2015-04-24 NOTE — Progress Notes (Signed)
Speech Language Pathology Treatment: Dysphagia  Patient Details Name: Read DriversFrank Valcarcel MRN: [id number] DOB: 07/20/1945 Today's Date: 04/24/2015 Time: [id number] SLP Time Calculation (min) (ACUTE ONLY): 12 min  Assessment / Plan / Recommendation Clinical Impression  Mr Broadus JohnWarren appears visibly much weaker than last visit with this SLP (10/20). Vocal intensity and overall endurance decreased. Oral care provided to moisten lip and tongue. Vocal quality clear during majority of session with expectoration of secretions x 1. Pt performed several effortful swallows of saliva on command with delayed initiation and loud audible dyscoordinated swallow. No trials po given to avoid coughing due to pain surrounding PEG site. Spoke with Helmut MusterAlicia, Palliative care who reports daughter appears open to comfort feeds; SLP will need to speak with pt re: desire to attempt po (pt typically coughs hard for extended period following food/liquids that may induce discomfort for pt versus comfort).    HPI Other Pertinent Information: 69 y.o. male with h/o CHF (EF 42% in 2014), CAD s/p CABG in 2000, CKD stage 3, ongoing EtOH abuse, medication non-compliance. He has recently been hospitalized [facility] [place] [facility] for [facility] and anemia. Was D/cd with plan to move to [place] with dtr so that she can be more involved in his medical care.  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  His dtr does not recall being advised to schedule f/u with a local neurologist.  [facility] with anemia (6.4), acute on chronic CHF.   Per records, pt had an MBS on 03/29/15 which revealed a severe dysphagia with poor pharyngeal clearance and eventual sensed aspiration of thin liquids.  Per bedside exam 10/19, pt presented with disordered phonation with low pitch; poor management of secretions; constant cough during meal times.  FEES recommended 10/19.    Pertinent Vitals Pain  Assessment: Faces Faces Pain Scale: Hurts even more Pain Intervention(s): Patient requesting pain meds-RN notified  SLP Plan  Continue with current plan of care    Recommendations                Oral Care Recommendations: Oral care QID Follow up Recommendations:  (TBD) Plan: Continue with current plan of care    GO     Royce MacadamiaLitaker, Chester Romero Willis 04/24/2015, 2:56 PM  Breck CoonsLisa Willis Bonnita Newby M.Ed [employer] Pager [phone]

## 2015-04-24 NOTE — Discharge Instructions (Signed)
Anemia, Nonspecific Anemia is a condition in which the concentration of red blood cells or hemoglobin in the blood is below normal. Hemoglobin is a substance in red blood cells that carries oxygen to the tissues of the body. Anemia results in not enough oxygen reaching these tissues.  CAUSES  Common causes of anemia include:   Excessive bleeding. Bleeding may be internal or external. This includes excessive bleeding from periods (in women) or from the intestine.   Poor nutrition.   Chronic kidney, thyroid, and liver disease.  Bone marrow disorders that decrease red blood cell production.  Cancer and treatments for cancer.  HIV, AIDS, and their treatments.  Spleen problems that increase red blood cell destruction.  Blood disorders.  Excess destruction of red blood cells due to infection, medicines, and autoimmune disorders. SIGNS AND SYMPTOMS   Minor weakness.   Dizziness.   Headache.  Palpitations.   Shortness of breath, especially with exercise.   Paleness.  Cold sensitivity.  Indigestion.  Nausea.  Difficulty sleeping.  Difficulty concentrating. Symptoms may occur suddenly or they may develop slowly.  DIAGNOSIS  Additional blood tests are often needed. These help your health care provider determine the best treatment. Your health care provider will check your stool for blood and look for other causes of blood loss.  TREATMENT  Treatment varies depending on the cause of the anemia. Treatment can include:   Supplements of iron, vitamin B12, or folic acid.   Hormone medicines.   A blood transfusion. This may be needed if blood loss is severe.   Hospitalization. This may be needed if there is significant continual blood loss.   Dietary changes.  Spleen removal. HOME CARE INSTRUCTIONS Keep all follow-up appointments. It often takes many weeks to correct anemia, and having your health care provider check on your condition and your response to  treatment is very important. SEEK IMMEDIATE MEDICAL CARE IF:   You develop extreme weakness, shortness of breath, or chest pain.   You become dizzy or have trouble concentrating.  You develop heavy vaginal bleeding.   You develop a rash.   You have bloody or black, tarry stools.   You faint.   You vomit up blood.   You vomit repeatedly.   You have abdominal pain.  You have a fever or persistent symptoms for more than 2-3 days.   You have a fever and your symptoms suddenly get worse.   You are dehydrated.  MAKE SURE YOU:  Understand these instructions.  Will watch your condition.  Will get help right away if you are not doing well or get worse.   This information is not intended to replace advice given to you by your health care provider. Make sure you discuss any questions you have with your health care provider.   Document Released: 07/24/2004 Document Revised: 02/16/2013 Document Reviewed: 12/10/2012 Elsevier Interactive Patient Education 2016 Elsevier Inc. Dysphagia Swallowing problems (dysphagia) occur when solids and liquids seem to stick in your throat on the way down to your stomach, or the food takes longer to get to the stomach. Other symptoms include regurgitating food, noises coming from the throat, chest discomfort with swallowing, and a feeling of fullness or the feeling of something being stuck in your throat when swallowing. When blockage in your throat is complete, it may be associated with drooling. CAUSES  Problems with swallowing may occur because of problems with the muscles. The food cannot be propelled in the usual manner into your stomach. You may have  ulcers, scar tissue, or inflammation in the tube down which food travels from your mouth to your stomach (esophagus), which blocks food from passing normally into the stomach. Causes of inflammation include:  Acid reflux from your stomach into your esophagus.  Infection.  Radiation  treatment for cancer.  Medicines taken without enough fluids to wash them down into your stomach. You may have nerve problems that prevent signals from being sent to the muscles of your esophagus to contract and move your food down to your stomach. Globus pharyngeus is a relatively common problem in which there is a sense of an obstruction or difficulty in swallowing, without any physical abnormalities of the swallowing passages being found. This problem usually improves over time with reassurance and testing to rule out other causes. DIAGNOSIS Dysphagia can be diagnosed and its cause can be determined by tests in which you swallow a white substance that helps illuminate the inside of your throat (contrast medium) while X-rays are taken. Sometimes a flexible telescope that is inserted down your throat (endoscopy) to look at your esophagus and stomach is used. TREATMENT   If the dysphagia is caused by acid reflux or infection, medicines may be used.  If the dysphagia is caused by problems with your swallowing muscles, swallowing therapy may be used to help you strengthen your swallowing muscles.  If the dysphagia is caused by a blockage or mass, procedures to remove the blockage may be done. HOME CARE INSTRUCTIONS  Try to eat soft food that is easier to swallow and check your weight on a daily basis to be sure that it is not decreasing.  Be sure to drink liquids when sitting upright (not lying down). SEEK MEDICAL CARE IF:  You are losing weight because you are unable to swallow.  You are coughing when you drink liquids (aspiration).  You are coughing up partially digested food. SEEK IMMEDIATE MEDICAL CARE IF:  You are unable to swallow your own saliva .  You are having shortness of breath or a fever, or both.  You have a hoarse voice along with difficulty swallowing. MAKE SURE YOU:  Understand these instructions.  Will watch your condition.  Will get help right away if you are  not doing well or get worse.   This information is not intended to replace advice given to you by your health care provider. Make sure you discuss any questions you have with your health care provider.   Document Released: 06/13/2000 Document Revised: 07/07/2014 Document Reviewed: 12/03/2012 Elsevier Interactive Patient Education [employer]2016 Elsevier Inc.

## 2015-04-24 NOTE — Progress Notes (Signed)
Bed offer received and [facility] by patient and [facility]. They can accept patient tomorrow. Ok per MD for d/c tomorrow if he remains stable.  Will require bolus tube feeds 3 X's a day per MD.  Lupita Leashonna T. Jaci LazierCrowder, KentuckyLCSW [phone]

## 2015-04-24 NOTE — Progress Notes (Signed)
Patient ID: Martin Cain, male   DOB: 06/13/1946, 69 y.o.   MRN: [id number]         Subjective:  Pt a little sore in abd secondary to recent G tube; family in room; otherwise stable  Allergies: Review of patient's allergies indicates no known allergies.  Medications: Prior to Admission medications   Medication Sig Start Date End Date Taking? Authorizing Provider  albuterol (PROVENTIL HFA;VENTOLIN HFA) 108 (90 BASE) MCG/ACT inhaler Inhale 2 puffs into the lungs every 6 (six) hours as needed for wheezing or shortness of breath. 06/28/13  Yes Lars MassonKatarina H Nelson, MD  allopurinol (ZYLOPRIM) 100 MG tablet Take 100 mg by mouth daily. 04/11/15  Yes Historical Provider, MD  amLODipine (NORVASC) 10 MG tablet Take 1 tablet (10 mg total) by mouth daily. 06/22/13  Yes Ripudeep Jenna LuoK Rai, MD  atorvastatin (LIPITOR) 80 MG tablet Take 80 mg by mouth daily. 04/11/15  Yes Historical Provider, MD  carvedilol (COREG) 25 MG tablet Take 1 tablet (25 mg total) by mouth 2 (two) times daily. 06/28/13  Yes Lars MassonKatarina H Nelson, MD  Cholecalciferol (VITAMIN D PO) Take 1,000 mg by mouth daily.   Yes Historical Provider, MD  colchicine 0.6 MG tablet Take 0.5 tablets (0.3 mg total) by mouth daily. 05/31/13  Yes Adeline Joselyn Glassman Viyuoh, MD  [employer] ASPIRIN LOW DOSE 81 MG EC tablet Take 81 mg by mouth daily. 04/11/15  Yes Historical Provider, MD  Cyanocobalamin (VITAMIN B-12 PO) Take 1,000 mg by mouth daily.   Yes Historical Provider, MD  ferrous sulfate 325 (65 FE) MG tablet Take 325 mg by mouth every morning.  04/11/15  Yes Historical Provider, MD  Fluticasone-Salmeterol (ADVAIR) 250-50 MCG/DOSE AEPB Inhale 1 puff into the lungs 2 (two) times daily. 06/09/13  Yes Lars MassonKatarina H Nelson, MD  folic acid (FOLVITE) 1 MG tablet Take 1 mg by mouth daily. 04/11/15  Yes Historical Provider, MD  furosemide (LASIX) 40 MG tablet Take 1 tablet (40 mg total) by mouth daily. 04/17/15  Yes Lars MassonKatarina H Nelson, MD  hydrALAZINE (APRESOLINE) 100 MG tablet Take  100 mg by mouth every morning.  04/11/15  Yes Historical Provider, MD  ipratropium-albuterol (DUONEB) 0.5-2.5 (3) MG/3ML SOLN Inhale 3 mLs into the lungs every 6 (six) hours as needed (shortness of breath).  04/11/15  Yes Historical Provider, MD  isosorbide dinitrate (ISOCHRON) 40 MG CR tablet Take 40 mg by mouth daily. 03/01/15  Yes Historical Provider, MD  lisinopril (PRINIVIL,ZESTRIL) 5 MG tablet Take 5 mg by mouth daily. 04/11/15  Yes Historical Provider, MD  nitroGLYCERIN (NITROSTAT) 0.4 MG SL tablet Place 0.4 mg under the tongue every 5 (five) minutes as needed for chest pain.   Yes Historical Provider, MD  omeprazole (PRILOSEC) 40 MG capsule Take 1 capsule (40 mg total) by mouth daily. 05/31/13  Yes Adeline C Viyuoh, MD  pantoprazole (PROTONIX) 40 MG tablet Take 1 tablet (40 mg total) by mouth daily. 06/22/13  Yes Ripudeep Jenna LuoK Rai, MD  QVAR 80 MCG/ACT inhaler Inhale 2 puffs into the lungs 2 (two) times daily. 04/11/15  Yes Historical Provider, MD  risperiDONE (RISPERDAL M-TABS) 1 MG disintegrating tablet Take 1 tablet (1 mg total) by mouth every evening. 05/31/13  Yes Adeline Joselyn Glassman Viyuoh, MD  vitamin C (ASCORBIC ACID) 250 MG tablet Take 250 mg by mouth daily. 04/11/15  Yes Historical Provider, MD  VOLTAREN 1 % GEL Apply 1 application topically 4 (four) times daily as needed (pain). Knee pain 02/28/15  Yes Historical Provider, MD  Vital Signs: BP 170/72 mmHg  Pulse 93  Temp(Src) 98.4 F (36.9 C) (Oral)  Resp 18  Ht  (1.753 m)  Wt 122 lb 3.8 oz (55.448 kg)  BMI 18.04 kg/m2  SpO2 95%  Physical Exam G tube intact, insertion site ok, mildly tender, abd soft, ND  Imaging: Ct Abdomen Wo Contrast  04/20/2015  CLINICAL DATA:  Evaluate for percutaneous gastrostomy tube placement. EXAM: CT ABDOMEN WITHOUT CONTRAST TECHNIQUE: Multidetector CT imaging of the abdomen was performed following the standard protocol without IV contrast. COMPARISON:  None. FINDINGS: Lower chest: The heart is markedly  enlarged. No pericardial effusion. Coronary artery calcifications are noted. Advanced aortic calcifications without focal aneurysm. The distal esophagus is grossly normal. There are small bilateral pleural effusions and overlying atelectasis. Hepatobiliary: No obvious hepatic lesions or intrahepatic biliary dilatation. The gallbladder is grossly normal. No common bile duct dilatation. Pancreas: No mass, inflammation or ductal dilatation. Spleen: Small spleen.  No focal lesions. Adrenals/Urinary Tract: Numerous bilateral renal cysts and extensive renal artery calcifications. No hydronephrosis. Stomach/Bowel: The stomach, duodenum, visualized small bowel and visualized colon are grossly normal without oral contrast. There is no interposing of the transverse colon between the anterior wall of the stomach and the anterior abdominal wall. The transverse colon is just below the stomach. Vascular/Lymphatic: Advanced atherosclerotic calcifications involving the aorta and branch vessels. Maximum aortic dimension is 29.5 mm. No obvious mesenteric or retroperitoneal mass or adenopathy. Scattered lymph nodes are noted. Other: No ascites or abdominal wall hernia Musculoskeletal: No significant osseous findings. IMPRESSION: 1. Small bilateral pleural effusions and overlying atelectasis. 2. Cardiac enlargement. 3. Advanced atherosclerotic calcifications involving the aorta and branch vessels. 4. Numerous bilateral renal cysts. 5. The transverse colon is not interposed between the anterior wall of the stomach and the anterior abdominal wall. Electronically Signed   By: Rudie Meyer M.D.   On: 04/20/2015 11:25   Ir Gastrostomy Tube Mod Sed  04/23/2015  CLINICAL DATA:  69 year old male with a history of dysphagia. He presents for gastrostomy tube placement. EXAM: PERCUTANEOUS GASTROSTOMY FLUOROSCOPY TIME:  1 minutes 42 seconds MEDICATIONS AND MEDICAL HISTORY: Versed 1.0 mg, Fentanyl 50 mcg. ANESTHESIA/SEDATION: Moderate sedation  time: 11 minutes CONTRAST:  10 cc through the tube PROCEDURE: The procedure, risks, benefits, and alternatives were explained to the patient and the patient's family. Questions regarding the procedure were encouraged and answered. The patient understands and consents to the procedure. The epigastrium was prepped with Betadine in a sterile fashion, and a sterile drape was applied covering the operative field. A sterile gown and sterile gloves were used for the procedure. A 5-French orogastric tube is placed under fluoroscopic guidance. Scout imaging of the abdomen confirms barium within the transverse colon. The stomach was distended with gas. Under fluoroscopic guidance, an 18 gauge needle was utilized to puncture the anterior wall of the body of the stomach. An Amplatz wire was advanced through the needle passing a T fastener into the lumen of the stomach. The T fastener was secured for gastropexy. A 9-French sheath was inserted. A snare was advanced through the 9-French sheath. A Teena Dunk was advanced through the orogastric tube. It was snared then pulled out the oral cavity, pulling the snare, as well. The leading edge of the gastrostomy was attached to the snare. It was then pulled down the esophagus and out the percutaneous site. It was secured in place. Contrast was injected. Patient tolerated the procedure well and remained hemodynamically stable throughout. No complications encountered and no significant  blood loss encountered. FINDINGS: The image demonstrates placement of a 20-French pull-through type gastrostomy tube into the body of the stomach. IMPRESSION: Status post image guided percutaneous gastrostomy tube. Signed, Yvone Neu. Loreta Ave, DO [facility] Electronically Signed   By: Gilmer Mor D.O.   On: 04/23/2015 11:13    Labs:  CBC:  Recent Labs  04/20/15 0224 04/21/15 0252 04/22/15 0248 04/23/15 0238  WBC 5.3 5.9 6.2 6.2  HGB 7.5*  8.5* 8.0* 8.5*  HCT 24.5* 27.5* 26.4* 28.5*  PLT 302 300 286 297    COAGS:  Recent Labs  04/17/15 2216 04/23/15 0238  INR 1.26 1.32  APTT 38* 35    BMP:  Recent Labs  04/20/15 0224 04/21/15 0252 04/22/15 0248 04/23/15 0238  NA 146* 141 143 146*  K 4.6 4.5 4.0 4.2  CL 111 107 107 107  CO2 GLUCOSE 81 98 91 91  BUN 43* 40* 35* 34*  CALCIUM 8.9 8.6* 8.2* 8.6*  CREATININE 2.24* 2.24* 2.23* 2.38*  GFRNONAA 28* 28* 28* 26*  GFRAA 32* 32* 33* 30*    LIVER FUNCTION TESTS:  Recent Labs  04/17/15 0928 04/17/15 2216  BILITOT 0.3 0.3  AST 20 20  ALT 22 22  ALKPHOS 65 70  PROT 6.4 6.6  ALBUMIN 3.5* 3.3*    Assessment and Plan:  S/p perc G tube 10/24; ok to use tube for feeds; afebrile; monitor labs  Signed: D. Jeananne Rama 04/24/2015, 10:24 AM   I spent a total of 15 minutes  at the the patient's bedside AND on the patient's hospital floor or unit, greater than 50% of which was counseling/coordinating care for perc gastrostomy tube

## 2015-04-24 NOTE — Clinical Social Work Placement (Signed)
   CLINICAL SOCIAL WORK PLACEMENT  NOTE  Date:  04/24/2015  Patient Details  Name: Martin Cain MRN: [id number] Date of Birth: 09/29/1945  Clinical Social Work is seeking post-discharge placement for this patient at the Skilled  Nursing Facility level of care (*CSW will initial, date and re-position this form in  chart as items are completed):  Yes   Patient/family provided with [place] Clinical Social Work Department's list of facilities offering this level of care within the geographic area requested by the patient (or if unable, by the patient's family).  Yes   Patient/family informed of their freedom to choose among providers that offer the needed level of care, that participate in Medicare, Medicaid or managed care program needed by the patient, have an available bed and are willing to accept the patient.  Yes   Patient/family informed of 's ownership interest in [facility] and [facility], as well as of the fact that they are under no obligation to receive care at these facilities.  PASRR submitted to EDS on 04/23/15     PASRR number received on 04/16/15     Existing PASRR number confirmed on       FL2 transmitted to all facilities in geographic area requested by pt/family on 04/23/15     FL2 transmitted to all facilities within larger geographic area on       Patient informed that his/her managed care company has contracts with or will negotiate with certain facilities, including the following:   ([employer] Medicare  (Not [employer]))     Yes   Patient/family informed of bed offers received.  [facility]     Physician recommends and patient chooses bed at      [facility]  Patient to be transferred to facility by [facility])     Patient family notified on   of transfer.  Name of family member notified:        PHYSICIAN Please prepare priority  discharge summary, including medications, Please sign FL2, Please prepare prescriptions     Additional Comment:    _______________________________________________ Darylene Pricerowder, Kelven Flater T, LCSW 04/24/2015, 3:40 PM

## 2015-04-24 NOTE — Progress Notes (Signed)
Pt. Attempting to get OOB multiple times unassisted. Pt. High fall risk and instructed to use call light for assistance when needing to get OOB. Pt. Forgetful at this time. On call NP, Donnamarie PoagK. Kirby made aware. New orders received. RN will implement and continue to monitor pt. For changes in condition. Carolann Brazell, Cheryll DessertKaren Cherrell

## 2015-04-24 NOTE — Progress Notes (Signed)
   04/24/15 1435  Clinical Encounter Type  Visited With Patient  Visit Type Initial  Spiritual Encounters  Spiritual Needs Literature   Chaplains have attempted to complete an advanced directive three times today, and patient has been resting. On third attempt, chaplain attempt to arouse patient for the advanced directive, but patient struggled to become alert. He stated he knew he was "suppose to sign something," but had a hard time staying awake, focusing, and asked if we could try again tomorrow. Chaplains will seek to follow-up, and our support is available as needed.   Alda Ponderdam M Aragon Scarantino, Chaplain 04/24/2015 2:37 PM

## 2015-04-25 LAB — CBC
HCT: 30.1 % — ABNORMAL LOW (ref 39.0–52.0)
HEMOGLOBIN: 9 g/dL — AB (ref 13.0–17.0)
MCH: 24.9 pg — AB (ref 26.0–34.0)
MCHC: 29.9 g/dL — AB (ref 30.0–36.0)
MCV: 83.4 fL (ref 78.0–100.0)
PLATELETS: 257 10*3/uL (ref 150–400)
RBC: 3.61 MIL/uL — ABNORMAL LOW (ref 4.22–5.81)
RDW: 17.9 % — ABNORMAL HIGH (ref 11.5–15.5)
WBC: 7.6 10*3/uL (ref 4.0–10.5)

## 2015-04-25 LAB — GLUCOSE, CAPILLARY
GLUCOSE-CAPILLARY: 129 mg/dL — AB (ref 65–99)
GLUCOSE-CAPILLARY: 140 mg/dL — AB (ref 65–99)
GLUCOSE-CAPILLARY: 87 mg/dL (ref 65–99)

## 2015-04-25 LAB — BASIC METABOLIC PANEL
Anion gap: 12 (ref 5–15)
BUN: 40 mg/dL — AB (ref 6–20)
CHLORIDE: 101 mmol/L (ref 101–111)
CO2: 31 mmol/L (ref 22–32)
CREATININE: 2.38 mg/dL — AB (ref 0.61–1.24)
Calcium: 8.9 mg/dL (ref 8.9–10.3)
GFR calc Af Amer: 30 mL/min — ABNORMAL LOW (ref >60)
GFR calc non Af Amer: 26 mL/min — ABNORMAL LOW (ref >60)
GLUCOSE: 134 mg/dL — AB (ref 65–99)
Potassium: 4.3 mmol/L (ref 3.5–5.1)
SODIUM: 144 mmol/L (ref 135–145)

## 2015-04-25 MED ORDER — ACETAMINOPHEN 325 MG PO TABS
650.0000 mg | ORAL_TABLET | Freq: Four times a day (QID) | ORAL | Status: DC | PRN
  Administered 2015-04-25: 650 mg
  Filled 2015-04-25: qty 2

## 2015-04-25 NOTE — Progress Notes (Signed)
   04/25/15 1000  Clinical Encounter Type  Visited With Patient  Visit Type Follow-up  Referral From Patient;Nurse  Spiritual Encounters  Spiritual Needs Literature (Updated Adv. Directive)  Advance Directives (For Healthcare)  Does patient have an advance directive? No  Would patient like information on creating an advanced directive? Yes - Educational materials given  [facility] met and did an assessment of pt to be able to complete Adv. Directive; pt was able to verbally answer questions, RN was present; [facility] gave materials for Adv. Directive; RN will contact [facility] when document filled out. Gwynn Burly 10:06 AM

## 2015-04-25 NOTE — Clinical Social Work Placement (Signed)
   CLINICAL SOCIAL WORK PLACEMENT  NOTE  Date:  04/25/2015  Patient Details  Name: Martin Cain MRN: [id number] Date of Birth: 03/26/1946  Clinical Social Work is seeking post-discharge placement for this patient at the Skilled  Nursing Facility level of care (*CSW will initial, date and re-position this form in  chart as items are completed):  Yes   Patient/family provided with [place] Clinical Social Work Department's list of facilities offering this level of care within the geographic area requested by the patient (or if unable, by the patient's family).  Yes   Patient/family informed of their freedom to choose among providers that offer the needed level of care, that participate in Medicare, Medicaid or managed care program needed by the patient, have an available bed and are willing to accept the patient.  Yes   Patient/family informed of [facility] ownership interest in [facility] and [facility], as well as of the fact that they are under no obligation to receive care at these facilities.  PASRR submitted to EDS on 04/23/15     PASRR number received on 04/16/15     Existing PASRR number confirmed on       FL2 transmitted to all facilities in geographic area requested by pt/family on 04/23/15     FL2 transmitted to all facilities within larger geographic area on       Patient informed that his/her managed care company has contracts with or will negotiate with certain facilities, including the following:   ([employer] Medicare  (Not [employer]))     Yes   Patient/family informed of bed offers received.  [facility]     Physician recommends and patient chooses bed at      [facility].  Patient to be transferred to facility by [facility])     Patient family notified on 04/25/15 of transfer.  Name of family member notified:  Daughter Alcario Droughtrica        PHYSICIAN Please prepare priority discharge summary, including medications, Please sign FL2, Please prepare prescriptions, Please sign DNR     Additional Comment: Ok per MD for d/c today to SNF for short term rehab.  CSW spoke to patient who indicated that he is feeling better today and is looking forward to leaving the hospital. He is aware and agreeable to SNF placement as is his daughter Alcario Droughtrica.  [employer] auth [facility]  EMS notified; DC summary sent to facility and nursing notified to call report.  No further CSW needs identified. CSW signing off.     _______________________________________________ Darylene Pricerowder, Selso Mannor T, LCSW 04/25/2015, 3:10 PM

## 2015-04-25 NOTE — Progress Notes (Signed)
Daily Progress Note   Patient Name: Martin Cain       Date: 04/25/2015 DOB: December 03, 1945  Age: 69 y.o. MRN#: [id number] Attending Physician: Clydia Llano, MD Primary Care Physician: Pcp Not In System Admit Date: 04/17/2015  Reason for Consultation/Follow-up: Establishing goals of care  Subjective: Mr. Yom is lying in bed. Daughter [name] at bedside. I will help her complete FMLA paperwork so she can be present in her father's care. We discussed prognosis and disease trajectory. We discussed the fact that Mr. Langhans has alluded to not wanting aggressive care and the options of more comfort based care. Gave her MOST form and Hard Choices booklet. We also discussed my conversation with SLP that intake is likely not a comfort for him as he struggled greatly. [name] seems understanding and is appreciative of the care he has received. No further decisions made today. Emotional support given during stressful circumstances. Plan for him to go to short term rehab and then to live with [name].    Interval Events: 10/24: PEG placed  Length of Stay: 4 days  Current Medications: Scheduled Meds:  . allopurinol  100 mg Oral Daily  . amLODipine  10 mg Oral Daily  . atorvastatin  80 mg Oral q1800  . budesonide (PULMICORT) nebulizer solution  0.5 mg Nebulization BID  . carvedilol  25 mg Oral BID  . feeding supplement (JEVITY 1.5 CAL/FIBER)  240 mL Per Tube TID  . ferrous sulfate  325 mg Oral Q breakfast  . folic acid  1 mg Oral Daily  . furosemide  40 mg Intravenous Daily  . hydrALAZINE  100 mg Oral Daily  . isosorbide dinitrate  40 mg Oral Daily  . mometasone-formoterol  2 puff Inhalation BID  . multivitamin with minerals  1 tablet Oral Daily  . neomycin-bacitracin-polymyxin   Topical Daily  .  pantoprazole  40 mg Oral Daily  . sodium chloride  3 mL Intravenous Q12H  . thiamine  100 mg Oral Daily   Or  . thiamine  100 mg Intravenous Daily    Continuous Infusions:    PRN Meds: acetaminophen, albuterol, hydrALAZINE, iohexol, ipratropium-albuterol, morphine injection, RESOURCE THICKENUP CLEAR  Physical Exam: Physical Exam  Constitutional: He appears well-developed.  Thin, frail  HENT:  Temporal muscle wasting  Pulmonary/Chest: Effort normal. No accessory muscle usage.  No tachypnea. No respiratory distress.  Abdominal: Soft. He exhibits no distension.  Tenderness s/p PEG  Neurological:  More oriented today  Skin: Skin is warm and dry.                Vital Signs: BP 144/63 mmHg  Pulse 71  Temp(Src) 97.9 F (36.6 C) (Oral)  Resp 18  Ht  (1.753 m)  Wt 54.568 kg (120 lb 4.8 oz)  BMI 17.76 kg/m2  SpO2 96% SpO2: SpO2: 96 % O2 Device: O2 Device: Not Delivered O2 Flow Rate:    Intake/output summary:   Intake/Output Summary (Last 24 hours) at 04/25/15 1034 Last data filed at 04/25/15 1012  Gross per 24 hour  Intake    600 ml  Output    825 ml  Net   -225 ml   Baseline Weight: Weight: 67.586 kg (149 lb) Most recent weight: Weight: 54.568 kg (120 lb 4.8 oz) (bed scale)       Palliative Assessment/Data: Flowsheet Rows        Most Recent Value   Intake Tab    Referral Department  Hospitalist   Unit at Time of Referral  Cardiac/Telemetry Unit   Palliative Care Primary Diagnosis  Cardiac   Date Notified  04/19/15   Palliative Care Type  New Palliative care   Reason for referral  Clarify Goals of Care, Non-pain Symptom   Date of Admission  04/17/15   Date first seen by Palliative Care  04/20/15   # of days Palliative referral response time  1 Day(s)   # of days IP prior to Palliative referral  2   Clinical Assessment    Psychosocial & Spiritual Assessment    Palliative Care Outcomes       Additional Data Reviewed: Recent Labs     04/24/15  1358   04/25/15  0232  WBC  8.4  7.6  HGB  9.9*  9.0*  PLT  262  257  NA  145  144  BUN  33*  40*  CREATININE  2.07*  2.38*     Problem List:  Patient Active Problem List   Diagnosis Date Noted  . Absolute anemia   . Dysphagia   . Palliative care encounter   . Symptomatic anemia 04/17/2015  . Malnutrition of moderate degree (HCC) 06/21/2013  . HTN (hypertension) 06/20/2013  . Acute on chronic combined systolic and diastolic heart failure (HCC) 06/20/2013  . Acute on chronic renal failure (HCC) 05/30/2013  . Acute on chronic combined systolic and diastolic CHF (congestive heart failure) (HCC) 05/28/2013  . Renal insufficiency 05/27/2013  . Acute exacerbation of CHF (congestive heart failure) (HCC) 05/27/2013  . Elevated troponin 05/27/2013  . Gout flare 05/27/2013  . HTN (hypertension), malignant 05/27/2013     Palliative Care Assessment & Plan    1.Code Status:  DNR     2. Goals of Care:  Treat the treatable. Watchful waiting to see what he is able to do. Main goal is focus on comfort and QOL.   Desire for further Chaplaincy support:no  Psycho-social Needs: Caregiving  Support/Resources  3. Symptom Management:      1.No symptoms to manage at this time. He does have some secretions and SLP is working with him.   4. Palliative Prophylaxis:   Bowel Regimen, Delirium Protocol and Turn Reposition  5. Prognosis: < 6 months  6. Discharge Planning:  Skilled Nursing Facility for rehab with Palliative care service follow-up   Thank you for  allowing the Palliative Medicine Team to assist in the care of this patient.   Time In: 0930 Time Out: 1000 Total Time 30min Prolonged Time Billed  no         Ulice BoldAlicia C Lazarus [place], NP  04/25/2015, 10:34 AM  Please contact Palliative Medicine Team phone at [phone] for questions and concerns.

## 2015-04-25 NOTE — Progress Notes (Signed)
Late entry SLP - [facility]  04/18/15 1500  SLP Visit Information  SLP Received On 04/18/15  Subjective  Subjective alert, dtr present for FEES  Pain Assessment  Pain Assessment No/denies pain  General Information  Other Pertinent Information 69 y.o. male with h/o CHF (EF 42% in 2014), CAD s/p CABG in 2000, CKD stage 3, ongoing EtOH abuse, medication non-compliance. [place] [facility]. Was D/cd with plan to move to [place] with dtr so that she can be more involved in his medical care.  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  His dtr does not recall being advised to schedule f/u with a local neurologist.  [facility] with anemia (6.4), acute on chronic CHF.   Per records, pt had an MBS on 03/29/15 which revealed a severe dysphagia with poor pharyngeal clearance and eventual sensed aspiration of thin liquids.  Per bedside exam 10/19, pt presented with disordered phonation with low pitch; poor management of secretions; constant cough during meal times.  FEES recommended 10/19.   Type of Study (FEES)  Reason for Referral Objectively evaluate swallowing function  Previous Swallow Assessment [facility]: "Pt presents with Severe Pharyngeal Dysphagia characterized by reduced pharyngeal clearance and cricopharyngeal function due to poor hyolaryngeal strength/ROM, aspiration during the swallow due to buildup of pharyngeal residue  Recommend: free PO water after oral care and peg tube placement, if pt continues to decline peg tube placement- recommend the following PO diet consistency as the safest option: pt performs best with thin liquids and may be given oral supplements to meet caloric needs, if pt requires more solid foods to meet nutritional needs- dysphagia level 2 with added gravy/sauce may be offered It is important to note,  pt remains an aspiration risk with all PO consistencies."  Diet Prior to this Study Regular  Temperature Spikes Noted No  Respiratory Status Room air  History of Recent Intubation No  Behavior/Cognition Alert  Oral Cavity - Dentition Adequate natural dentition/normal for age  Oral Motor / Sensory Function WFL  Self-Feeding Abilities Able to feed self  Patient Positioning Upright in bed  Baseline Vocal Quality Hoarse (wet, low pitch)  Volitional Cough Strong  Volitional Swallow Able to elicit  Anatomy WFL  Pharyngeal Secretions Standing secretions in (comment) (larynx and hypopharynx)  Oral Preparation/Oral Phase  Oral Phase WFL  Pharyngeal Phase  Pharyngeal Phase Impaired  Pharyngeal - Nectar  Pharyngeal- Nectar Cup Swallow initiation at pyriform sinus;Reduced pharyngeal peristalsis;Reduced airway/laryngeal closure;Penetration/Aspiration during swallow;Penetration/Apiration after swallow;Inter-arytenoid space residue  Pharyngeal - Thin  Pharyngeal- Thin Cup Swallow initiation at pyriform sinus;Reduced pharyngeal peristalsis;Reduced airway/laryngeal closure;Penetration/Aspiration during swallow;Penetration/Apiration after swallow;Inter-arytenoid space residue  Pharyngeal - Solids  Pharyngeal- Puree Reduced pharyngeal peristalsis;Reduced airway/laryngeal closure;Penetration/Aspiration during swallow;Penetration/Apiration after swallow;Inter-arytenoid space residue;Swallow initiation at vallecula  Pharyngeal- Mechanical Soft Reduced pharyngeal peristalsis;Reduced airway/laryngeal closure;Penetration/Aspiration during swallow;Penetration/Apiration after swallow;Inter-arytenoid space residue;Swallow initiation at vallecula  Clinical Impression  Therapy Diagnosis Severe pharyngeal phase dysphagia  Clinical Impression FEES exam revealed unremarkable larynx/pharynx, excluding copious standing secretions, which consistently penetrated to the vocal cords, causing pt to cough and contributed to  his wet-sounding voice.  Pt presented with a likely neurogenic mod-severe pharyngeal dysphagia marked by poor clearance of POs through the pharynx, consistent penetration of POs, and possible backflow of POs through UES back into hypopharynx.  Significant  residue mixed with standing secretions, all of which continued to spill into larynx to level of vocal cords, eliciting a cough, which would eject penetrates - a portion of which would be swallowed; a portion of which would fall back into larynx.  Despite the constant and explosive coughing, limited aspiration was visualized.  D/W Dr. Catha Gosselin - given no development of pna, recommend continuing a cautious PO diet (dysphagia 2, nectars); recommend neurology f/u to assist with dx of etiology of dysphagia; recommend OPSLP to address dysphagia and voice.  D/w pt and dtr; will follow acutely.   Swallow Evaluation Recommendations  Recommended Consults (consider neuro eval)  SLP Diet Recommendations Dysphagia 2 (Fine chop);Nectar  Liquid Administration via Cup  Medication Administration Crushed with puree  Supervision Patient able to self feed;Intermittent supervision to cue for compensatory strategies  Compensations Slow rate;Small sips/bites;Effortful swallow  Treatment Plan  Oral Care Recommendations Oral care BID  Other Recommendations Order thickener from pharmacy (oral suctioning demonstrated)  Treatment Recommendations Therapy as outlined in treatment plan below  Speech Therapy Frequency (ACUTE ONLY) min 3x week  Treatment Duration 1 week  Interventions Compensatory techniques;Patient/family education;Diet toleration management by SLP  Prognosis  Prognosis for Safe Diet Advancement Fair  Individuals Consulted  Consulted and Agree with Results and Recommendations Patient;Family member/caregiver  Family Member Consulted dtr, [name]  SLP Time Calculation  SLP Start Time (ACUTE ONLY) 1400  SLP Stop Time (ACUTE ONLY) 1515  SLP Time Calculation (min)  (ACUTE ONLY) 75 min  SLP G-Codes **NOT FOR INPATIENT CLASS**  Functional Assessment Tool Used clinical judgement  Functional Limitations Swallowing  Swallow Current Status (W0981) CK  Swallow Goal Status (X9147) CL  Swallow Discharge Status (W2956) CJ  SLP Evaluations  $ SLP Speech Visit 1 Procedure  SLP Evaluations  $FEES Swallow 1 Procedure  $Swallowing Treatment 1 Procedure

## 2015-04-25 NOTE — Progress Notes (Signed)
Late SLP entry - addendum from 04/18/15  04/18/15 1500  SLP Visit Information  SLP Received On 04/18/15  General Information  Behavior/Cognition Alert  Patient Positioning Upright in bed  Other Pertinent Information 69 y.o. male with h/o CHF (EF 42% in 2014), CAD s/p CABG in 2000, CKD stage 3, ongoing EtOH abuse, medication non-compliance. [place] has [place] [facility] [place] [facility]. Was D/cd with plan to move to [place] with dtr so that she can be more involved in his medical care.  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  His dtr does not recall being advised to schedule f/u with a local neurologist.  [facility] (6.4), acute on chronic CHF.   Per records, pt had an MBS on 03/29/15 which revealed a severe dysphagia with poor pharyngeal clearance and eventual sensed aspiration of thin liquids.  Per bedside exam 10/19, pt presented with disordered phonation with low pitch; poor management of secretions; constant cough during meal times.  FEES recommended 10/19.   Temperature Spikes Noted No  Respiratory Status Room air  Oral Cavity - Dentition Adequate natural dentition/normal for age  Pain Assessment  Pain Assessment No/denies pain  Dysphagia Recommendations  Medication Administration Crushed with puree  Compensations Slow rate;Small sips/bites;Effortful swallow  General Recommendations  Oral Care Recommendations Oral care BID  SLP Time Calculation  SLP Start Time (ACUTE ONLY) 1400  SLP Stop Time (ACUTE ONLY) 1515  SLP Time Calculation (min) (ACUTE ONLY) 75 min  SLP G-Codes **NOT FOR INPATIENT CLASS**  Functional Assessment Tool Used clinical judgement  Functional Limitations Swallowing  Swallow Current Status (R6045(G8996) CK  Swallow Goal Status (W0981(G8997) CL  Swallow Discharge Status (X9147(G8998) CJ  SLP Evaluations  $ SLP Speech Visit 1 Procedure  SLP Evaluations  $FEES  Swallow 1 Procedure  $Swallowing Treatment 1 Procedure

## 2015-04-25 NOTE — Progress Notes (Signed)
Subjective: Denies any symptoms or changes since yesterday.  Objective: Filed Vitals:   04/25/15 0432  BP: 144/63  Pulse: 71  Temp: 97.9 F (36.6 C)  Resp: 18  General: Alert and awake, oriented x3, not in any acute distress. HEENT: anicteric sclera, pupils reactive to light and accommodation, EOMI CVS: S1-S2 clear, no murmur rubs or gallops Chest: clear to auscultation bilaterally, no wheezing, rales or rhonchi Abdomen: soft nontender, nondistended, normal bowel sounds, no organomegaly Extremities: no cyanosis, clubbing or edema noted bilaterally Neuro: Cranial nerves II-XII intact, no focal neurological deficits  Assessment and plan Symptomatic anemia Clinically undetermined, unclear etiology, patient has baseline anemia. Recent EGD was inconclusive. Capsule endoscopy to be determined as outpatient. Status post transfusion of 2 units of packed RBCs. Acute on chronic combined systolic and diastolic CHF back to baseline. CKD stage IV, at baseline. Dysphagia with aspiration, recent 40 pound weight loss, placed on dysphagia 2 diet. PEG placed on 04/23/15, bolus to feeding.  Clint LippsMutaz A Ngai Parcell Pager: [phone] 04/25/2015, 10:04 AM

## 2015-04-25 NOTE — Progress Notes (Addendum)
Orders received for pt discharge.   IV removed and site remains clean, dry, intact.  Telemetry removed.  [facility] in stable condition and [facility]  Gave report to receiving nurse at [phone], and she had no further questions at this time.

## 2015-04-25 NOTE — Progress Notes (Signed)
Daily Progress Note   Patient Name: Martin Cain       Date: 04/25/2015 DOB: 1946/02/08  Age: 69 y.o. MRN#: [id number] Attending Physician: Clydia Llano, MD Primary Care Physician: Pcp Not In System Admit Date: 04/17/2015  Reason for Consultation/Follow-up: Establishing goals of care  Subjective: Martin Cain is lying in bed. Still has secretions but able to understand him mostly. He discuss rehab to help him and help Martin Cain have time to prepare for bringing him home. He lights up with a big smile and says "Martin Cain is my baby girl." We discussed all the changes and difficulties in his health he has had recently. He says he has accepted where he is and cannot worry about things. He says he is not afraid to die and has lived a good life. He does complain of tenderness s/p PEG placement but says that Tylenol has helped. No further concerns.  I also spoke with SLP who says that he is unlikely to have much comfort with any food intake as he has greatly struggled for ~1 hr with just a small intake. I will discuss this with Erica in the morning.   Interval Events: 10/24: PEG placed  Length of Stay: 4 days  Current Medications: Scheduled Meds:  . allopurinol  100 mg Oral Daily  . amLODipine  10 mg Oral Daily  . atorvastatin  80 mg Oral q1800  . budesonide (PULMICORT) nebulizer solution  0.5 mg Nebulization BID  . carvedilol  25 mg Oral BID  . feeding supplement (JEVITY 1.5 CAL/FIBER)  240 mL Per Tube TID  . ferrous sulfate  325 mg Oral Q breakfast  . folic acid  1 mg Oral Daily  . furosemide  40 mg Intravenous Daily  . hydrALAZINE  100 mg Oral Daily  . isosorbide dinitrate  40 mg Oral Daily  . mometasone-formoterol  2 puff Inhalation BID  . multivitamin with minerals  1 tablet Oral Daily  .  neomycin-bacitracin-polymyxin   Topical Daily  . pantoprazole  40 mg Oral Daily  . sodium chloride  3 mL Intravenous Q12H  . thiamine  100 mg Oral Daily   Or  . thiamine  100 mg Intravenous Daily    Continuous Infusions:    PRN Meds: acetaminophen, albuterol, hydrALAZINE, iohexol, ipratropium-albuterol, morphine injection, RESOURCE THICKENUP CLEAR  Physical  Exam: Physical Exam  Constitutional: He appears well-developed.  Thin, frail  HENT:  Temporal muscle wasting  Pulmonary/Chest: Effort normal. No accessory muscle usage. No tachypnea. No respiratory distress.  Abdominal: Soft. He exhibits no distension.  Tenderness s/p PEG  Neurological:  More oriented today  Skin: Skin is warm and dry.                Vital Signs: BP 144/63 mmHg  Pulse 71  Temp(Src) 97.9 F (36.6 C) (Oral)  Resp 18  Ht  (1.753 m)  Wt 54.568 kg (120 lb 4.8 oz)  BMI 17.76 kg/m2  SpO2 96% SpO2: SpO2: 96 % O2 Device: O2 Device: Not Delivered O2 Flow Rate:    Intake/output summary:   Intake/Output Summary (Last 24 hours) at 04/25/15 1610 Last data filed at 04/25/15 0600  Gross per 24 hour  Intake    300 ml  Output    675 ml  Net   -375 ml   Baseline Weight: Weight: 67.586 kg (149 lb) Most recent weight: Weight: 54.568 kg (120 lb 4.8 oz) (bed scale)       Palliative Assessment/Data: Flowsheet Rows        Most Recent Value   Intake Tab    Referral Department  Hospitalist   Unit at Time of Referral  Cardiac/Telemetry Unit   Palliative Care Primary Diagnosis  Cardiac   Date Notified  04/19/15   Palliative Care Type  New Palliative care   Reason for referral  Clarify Goals of Care, Non-pain Symptom   Date of Admission  04/17/15   Date first seen by Palliative Care  04/20/15   # of days Palliative referral response time  1 Day(s)   # of days IP prior to Palliative referral  2   Clinical Assessment    Psychosocial & Spiritual Assessment    Palliative Care Outcomes       Additional  Data Reviewed: Recent Labs     04/24/15  1358  04/25/15  0232  WBC  8.4  7.6  HGB  9.9*  9.0*  PLT  262  257  NA  145  144  BUN  33*  40*  CREATININE  2.07*  2.38*     Problem List:  Patient Active Problem List   Diagnosis Date Noted  . Absolute anemia   . Dysphagia   . Palliative care encounter   . Symptomatic anemia 04/17/2015  . Malnutrition of moderate degree (HCC) 06/21/2013  . HTN (hypertension) 06/20/2013  . Acute on chronic combined systolic and diastolic heart failure (HCC) 06/20/2013  . Acute on chronic renal failure (HCC) 05/30/2013  . Acute on chronic combined systolic and diastolic CHF (congestive heart failure) (HCC) 05/28/2013  . Renal insufficiency 05/27/2013  . Acute exacerbation of CHF (congestive heart failure) (HCC) 05/27/2013  . Elevated troponin 05/27/2013  . Gout flare 05/27/2013  . HTN (hypertension), malignant 05/27/2013     Palliative Care Assessment & Plan    1.Code Status:  DNR     2. Goals of Care:  Treat the treatable. Watchful waiting to see what he is able to do. Main goal is focus on comfort and QOL.   Desire for further Chaplaincy support:no  Psycho-social Needs: Caregiving  Support/Resources  3. Symptom Management:      1.No symptoms to manage at this time. He does have some secretions and SLP is working with him.   4. Palliative Prophylaxis:   Bowel Regimen, Delirium Protocol and Turn Reposition  5. Prognosis: < 6 months  6. Discharge Planning:  Skilled Nursing Facility for rehab with Palliative care service follow-up   Thank you for allowing the Palliative Medicine Team to assist in the care of this patient.   Time In: 1400 Time Out: 1420 Total Time 20min Prolonged Time Billed  no         Ulice BoldAlicia C Nalleli Largent, NP  04/25/2015, 8:38 AM  Please contact Palliative Medicine Team phone at [phone] for questions and concerns.

## 2015-05-05 ENCOUNTER — Emergency Department (HOSPITAL_COMMUNITY)
Admission: EM | Admit: 2015-05-05 | Discharge: 2015-05-05 | Disposition: A | Discharge status: Home / Self Care | Payer: Medicare HMO | Source: Home / Self Care | Attending: Emergency Medicine | Admitting: Emergency Medicine

## 2015-05-05 ENCOUNTER — Encounter (HOSPITAL_COMMUNITY): Payer: Self-pay | Admitting: *Deleted

## 2015-05-05 DIAGNOSIS — R531 Weakness: Secondary | ICD-10-CM | POA: Diagnosis not present

## 2015-05-05 DIAGNOSIS — K921 Melena: Secondary | ICD-10-CM | POA: Diagnosis not present

## 2015-05-05 NOTE — ED Notes (Signed)
PT[facility] called @ 1636 

## 2015-05-05 NOTE — ED Provider Notes (Signed)
CSN: [id number]     Arrival date & time 05/05/15  1306 History   First MD Initiated Contact with Patient 05/05/15 1321     Chief Complaint  Patient presents with  . Weakness     (Consider location/radiation/quality/duration/timing/severity/associated sxs/prior Treatment) Patient is a 69 y.o. male presenting with weakness. The history is provided by the patient.  Weakness Pertinent negatives include no chest pain, no abdominal pain, no headaches and no shortness of breath.   patient brought in by EMS from nursing facility. They were called for left facial weakness left arm weakness that was onset at 11:00 this morning. Patient was normally ambulatory. Appears of there was some degree of confusion as well. Patient complaining of some tailbone pain since last week. EMS did not call a code stroke in the field because patient is a DO NOT RESUSCITATE do not transfer no IVs no antibiotics comfort care only. Upon arrival here patient seemed to be back to baseline and was answering questions. Confirmed that he does not want anything done.  Past Medical History  Diagnosis Date  . Hypertension   . Hyperlipidemia   . CKD (chronic kidney disease)     a. Probable stage III.  Marland Kitchen CAD (coronary artery disease)     a. CABG 2000 in [place]. b. NSTEMI 04/2013 felt due to malignant hypertension (was instructed to f/u ATL for stress test).  . Gout   . CHF (congestive heart failure) (HCC)     a. Echo 04/2013: EF 45-50%, mild AS, mild biatrial enlargement, sev LVH.  . Osteoarthritis     R knee  . Anemia    Past Surgical History  Procedure Laterality Date  . Cardiac surgery      2000; [place]  . Appendectomy    . Abdominal exploration surgery    . Tonsillectomy     Family History  Problem Relation Age of Onset  . Heart disease      No family history  . Cancer Father   . Hypertension Father    Social History  Substance Use Topics  . Smoking status: Current Every Day Smoker -- 0.50 packs/day for  40 years    Types: Cigarettes  . Smokeless tobacco: Never Used  . Alcohol Use: Yes     Comment: previously admitted to 1 pint per day. denies use for 1 month.    Review of Systems  Constitutional: Negative for fever.  HENT: Negative for congestion.   Eyes: Negative for redness.  Respiratory: Negative for shortness of breath.   Cardiovascular: Negative for chest pain.  Gastrointestinal: Negative for abdominal pain.  Musculoskeletal: Positive for back pain.  Skin: Negative for rash.  Neurological: Positive for weakness. Negative for headaches.  Psychiatric/Behavioral: Negative for confusion.      Allergies  Review of patient's allergies indicates no known allergies.  Home Medications   Prior to Admission medications   Medication Sig Start Date End Date Taking? Authorizing Provider  allopurinol (ZYLOPRIM) 100 MG tablet Place 1 tablet (100 mg total) into feeding tube daily. 04/24/15  Yes Maryann Mikhail, DO  amLODipine (NORVASC) 10 MG tablet Place 1 tablet (10 mg total) into feeding tube daily. 04/24/15  Yes Maryann Mikhail, DO  atorvastatin (LIPITOR) 80 MG tablet Place 1 tablet (80 mg total) into feeding tube daily. 04/24/15  Yes Maryann Mikhail, DO  carvedilol (COREG) 25 MG tablet Place 1 tablet (25 mg total) into feeding tube 2 (two) times daily. 04/24/15  Yes Maryann Mikhail, DO  Cholecalciferol (VITAMIN D PO)  Place 1,000 mg into feeding tube daily.    Yes Historical Provider, MD  colchicine 0.6 MG tablet Place 0.3 mg into feeding tube daily.   Yes Historical Provider, MD  [facility] ASPIRIN LOW DOSE 81 MG EC tablet Take 81 mg by mouth daily. 04/11/15  Yes Historical Provider, MD  Cyanocobalamin (VITAMIN B-12 PO) Place 1,000 mg into feeding tube daily.    Yes Historical Provider, MD  ferrous sulfate 325 (65 FE) MG tablet Place 325 mg into feeding tube every morning.  04/11/15  Yes Historical Provider, MD  Fluticasone-Salmeterol (ADVAIR) 250-50 MCG/DOSE AEPB Inhale 1 puff into the lungs  2 (two) times daily. 06/09/13  Yes Lars Masson, MD  folic acid (FOLVITE) 1 MG tablet Place 1 mg into feeding tube daily.  04/11/15  Yes Historical Provider, MD  furosemide (LASIX) 40 MG tablet Place 1 tablet (40 mg total) into feeding tube daily. 04/24/15  Yes Maryann Mikhail, DO  hydrALAZINE (APRESOLINE) 100 MG tablet Place 1 tablet (100 mg total) into feeding tube every morning. 04/24/15  Yes Maryann Mikhail, DO  isosorbide dinitrate (ISORDIL) 40 MG tablet Place 40 mg into feeding tube daily.   Yes Historical Provider, MD  Multiple Vitamin (MULTIVITAMIN WITH MINERALS) TABS tablet Take 1 tablet by mouth daily. Patient taking differently: Place 1 tablet into feeding tube daily.  04/24/15  Yes Maryann Mikhail, DO  Nutritional Supplements (FEEDING SUPPLEMENT, JEVITY 1.5 CAL/FIBER,) LIQD Place 240 mLs into feeding tube 3 (three) times daily. Patient taking differently: Place 240 mLs into feeding tube 6 (six) times daily.  04/24/15  Yes Maryann Mikhail, DO  pantoprazole sodium (PROTONIX) 40 mg/20 mL PACK Place 40 mg into feeding tube daily.   Yes Historical Provider, MD  QVAR 80 MCG/ACT inhaler Inhale 2 puffs into the lungs 2 (two) times daily. 04/11/15  Yes Historical Provider, MD  risperiDONE (RISPERDAL M-TABS) 1 MG disintegrating tablet Take 1 tablet (1 mg total) by mouth every evening. 05/31/13  Yes Adeline Joselyn Glassman, MD  thiamine 100 MG tablet Take 1 tablet (100 mg total) by mouth daily. Patient taking differently: Place 100 mg into feeding tube daily.  04/24/15  Yes Maryann Mikhail, DO  albuterol (PROVENTIL HFA;VENTOLIN HFA) 108 (90 BASE) MCG/ACT inhaler Inhale 2 puffs into the lungs every 6 (six) hours as needed for wheezing or shortness of breath. 06/28/13   Lars Masson, MD  ipratropium-albuterol (DUONEB) 0.5-2.5 (3) MG/3ML SOLN Inhale 3 mLs into the lungs every 6 (six) hours as needed (shortness of breath).  04/11/15   Historical Provider, MD  nitroGLYCERIN (NITROSTAT) 0.4 MG SL tablet  Place 0.4 mg under the tongue every 5 (five) minutes as needed for chest pain.    Historical Provider, MD  pantoprazole (PROTONIX) 40 MG tablet Take 1 tablet (40 mg total) by mouth daily. Patient not taking: Reported on 05/05/2015 06/22/13   Ripudeep K Rai, MD   BP 126/76 mmHg  Pulse 71  Temp(Src) 97.5 F (36.4 C) (Oral)  Resp 12  SpO2 100% Physical Exam  Constitutional: He is oriented to person, place, and time. He appears well-developed and well-nourished. No distress.  HENT:  Head: Normocephalic and atraumatic.  Mouth/Throat: Oropharynx is clear and moist.  Eyes: Conjunctivae and EOM are normal. Pupils are equal, round, and reactive to light.  Neck: Normal range of motion.  Cardiovascular: Normal rate, regular rhythm and normal heart sounds.   No murmur heard. Pulmonary/Chest: Effort normal and breath sounds normal. No respiratory distress.  Abdominal: Soft. Bowel sounds are  normal. There is no tenderness.  Musculoskeletal: Normal range of motion. He exhibits no edema.  Neurological: He is alert and oriented to person, place, and time. No cranial nerve deficit. He exhibits normal muscle tone. Coordination normal.  Skin: Skin is warm. No rash noted.  Nursing note and vitals reviewed.   ED Course  Procedures (including critical care time) Labs Review Labs Reviewed - No data to display  Imaging Review No results found. I have personally reviewed and evaluated these images and lab results as part of my medical decision-making.   EKG Interpretation   Date/Time:  Saturday May 05 2015 13:16:21 EDT Ventricular Rate:  64 PR Interval:  160 QRS Duration: 108 QT Interval:  475 QTC Calculation: 490 R Axis:   -87 Text Interpretation:  Sinus rhythm Left atrial enlargement Left anterior  fascicular block LVH with secondary repolarization abnormality Anterior Q  waves, possibly due to LVH No significant change since last tracing  Confirmed by Nyzir Dubois  MD, Margrit Minner ([id number]) on  05/05/2015 1:23:25 PM      MDM   Final diagnoses:  Weakness    Patient brought in by EMS from nursing facility. Patient has paperwork stating he is a DO NOT RESUSCITATE do not transfer no IVs no antibiotics comfort care only. There was concern from the nursing facility the patient may have had a stroke. On arrival here patient was back to baseline. Patient refused any interventions. Discussed with his daughter and the nursing facility. Patient's vital signs without any significant abnormalities here. Patient will be discharged back to the nursing facility.  Review of patient's records show admission October 16 and October 18. During the admission during October 18 patient was made a DO NOT RESUSCITATE. Palliative care was involved. Patient was discharged to the nursing facility. Patient does not seem to have a terminal illness from review of the chart however has several chronic illnesses. Patient appears to be of sound mind and does not 1 any interventions. Patient has dysphagia and had G-tube placed because when he takes food orally he tends to aspirate. Patient also has a history of significant anemias. But since patient does not want an IV patient cannot receive blood transfusion.    Vanetta MuldersScott Juhi Lagrange, MD 05/05/15 [phone]

## 2015-05-05 NOTE — ED Notes (Signed)
Per EMS- 98/62, pt was noted to have left facial weakness and left arm weakness that was noticed at 11:10am today. [facility]. Pt reports pain in tailbone from fall last weak. Pt with Most form.

## 2015-05-05 NOTE — ED Notes (Signed)
Pressure ulcer pad placed on sacrum due to pt reporting pain. Pt noted to have a stage 1 wound to sacrum.

## 2015-05-05 NOTE — Discharge Instructions (Signed)
Patient alert and cooperative here no obvious weakness. Patient refused any care. Patient had paperwork with him declaring that he was a DO NOT RESUSCITATE and wanted no IVs no antibiotics one is comfort care only and also did not 1 transportation. We readdress this with the patient and he does not one anything done and wants to go back to the nursing facility.

## 2015-05-07 ENCOUNTER — Encounter: Payer: Self-pay | Admitting: Neurology

## 2015-05-07 ENCOUNTER — Ambulatory Visit (INDEPENDENT_AMBULATORY_CARE_PROVIDER_SITE_OTHER): Payer: Medicare HMO | Admitting: Neurology

## 2015-05-07 ENCOUNTER — Telehealth: Payer: Self-pay | Admitting: Neurology

## 2015-05-07 ENCOUNTER — Other Ambulatory Visit: Payer: Self-pay | Admitting: *Deleted

## 2015-05-07 VITALS — BP 130/78 | HR 64 | Ht 69.0 in | Wt 125.2 lb

## 2015-05-07 DIAGNOSIS — G231 Progressive supranuclear ophthalmoplegia [Steele-Richardson-Olszewski]: Secondary | ICD-10-CM | POA: Diagnosis not present

## 2015-05-07 DIAGNOSIS — R1310 Dysphagia, unspecified: Secondary | ICD-10-CM

## 2015-05-07 DIAGNOSIS — R471 Dysarthria and anarthria: Secondary | ICD-10-CM

## 2015-05-07 DIAGNOSIS — R269 Unspecified abnormalities of gait and mobility: Secondary | ICD-10-CM

## 2015-05-07 DIAGNOSIS — R29898 Other symptoms and signs involving the musculoskeletal system: Secondary | ICD-10-CM | POA: Diagnosis not present

## 2015-05-07 MED ORDER — CARBIDOPA-LEVODOPA 25-100 MG PO TABS: 0.5000 | ORAL_TABLET | Freq: Three times a day (TID) | ORAL | Status: DC

## 2015-05-07 NOTE — Telephone Encounter (Signed)
[facility] Fax # [phone]

## 2015-05-07 NOTE — Telephone Encounter (Signed)
Rx sent 

## 2015-05-07 NOTE — Telephone Encounter (Signed)
[facility] faxed

## 2015-05-07 NOTE — Telephone Encounter (Signed)
[facility] called in regards to PT and said they did not get any instructions from today's visit/Dawn CB# [phone]

## 2015-05-07 NOTE — Progress Notes (Signed)
[facility] Clinic Note - Initial Visit   Date: 05/07/2015  Martin Cain MRN: [id number] DOB: [birth year]   Dear Dr. Ree Kida:  Thank you for your kind referral of Martin Cain for consultation of dysphagia and dysarthria. Although his history is well known to you, please allow [place] to reiterate it for the purpose of our medical record. The patient was accompanied to the clinic by daughter and daughter's friend who also provides collateral information.     History of Present Illness: Martin Cain is a 69 y.o. right-handed African American male with hypertension, hyperlipidemia, COPD, stage IV CKD, CAD s/p CABG, CHF, gout, alcohol abuse, anemia, and tobacco use presenting for evaluation of dysphagia and dysarthria.    He was living in [place] and moved to [place] in October 1st 2016.  He is a poor historian and most of the history is obtained from his daughter.  He has had progressive difficulty with walking over the past two years. He has been using a walker for 2+ years.  He fell a lot last year, mostly backwards but fortunately did not sustain significant injuries.    Starting around Spring 2016, he began having difficulty with slurred speech and dysphagia. Speech is more effortful, softer, and slurred.  He had PEG placed in October during his admission for anemia, and but was found to have severe dysphagia which was evidenced on swallow study.  Further, he has lost 40lb unintentially over the past year.  During his recent hospitalization, palliative medicine was consulted and discussed goals of care.  He was made DNR in the hospital, but daughter has not set up POA yet.   He denies muscle cramps, twitches, or weakness.  He presents a wheelchair and daughter who states that he is weak.  He feels very stiff in his arms and legs. He can still smell.  Denies problems with RLS or vivid dreams.   He has has multiple hospitalizations this year for various issues which  was all in [place].  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   Out-side paper records, electronic medical record, and images have been reviewed where available and summarized as:  Lab Results  Component Value Date   TSH 0.623 05/27/2013   Lab Results  Component Value Date   CKTOTAL 886* 06/20/2013   TROPONINI 1.02* 06/20/2013   MRI brain wo contrast 05/30/2013: Exam is motion degraded. No acute infarct. Remote small right basal ganglia/thalamic infarct. Remote tiny right paracentral pontine infarct. Remote tiny left cerebellar infarct. Mild small vessel disease type changes Atrophy. Small right vertebral artery with superimposed atherosclerotic type changes and significant narrowing.    Past Medical History  Diagnosis Date  . Hypertension   . Hyperlipidemia   . CKD (chronic kidney disease)     a. Probable stage III.  Marland Kitchen CAD (coronary artery disease)     a. CABG 2000 in [place]. b. NSTEMI 04/2013 felt due to malignant hypertension (was instructed to f/u ATL for stress test).  . Gout   . CHF (congestive heart failure) ([place])     a. Echo 04/2013: EF 45-50%, mild AS, mild biatrial enlargement, sev LVH.  . Osteoarthritis     R knee  . Anemia     Past Surgical History  Procedure Laterality Date  . Cardiac surgery      2000; [place]  . Appendectomy    . Abdominal exploration surgery    . Tonsillectomy  Medications:  Outpatient Encounter Prescriptions as of 05/07/2015  Medication Sig Note  . albuterol (PROVENTIL HFA;VENTOLIN HFA) 108 (90 BASE) MCG/ACT inhaler Inhale 2 puffs into the lungs every 6 (six) hours as needed for wheezing or shortness of breath.   . allopurinol (ZYLOPRIM) 100 MG tablet Place 1 tablet (100 mg total) into feeding tube daily.   Marland Kitchen amLODipine (NORVASC) 10 MG tablet Place 1 tablet (10 mg total) into feeding tube daily.   Marland Kitchen atorvastatin (LIPITOR) 80 MG tablet Place 1 tablet (80 mg total) into feeding tube  daily.   . carvedilol (COREG) 25 MG tablet Place 1 tablet (25 mg total) into feeding tube 2 (two) times daily.   . Cholecalciferol (VITAMIN D PO) Place 1,000 mg into feeding tube daily.    . colchicine 0.6 MG tablet Place 0.3 mg into feeding tube daily.   . CVS ASPIRIN LOW DOSE 81 MG EC tablet Take 81 mg by mouth daily. 05/05/2015: Take PO as med is not to be crushed  . Cyanocobalamin (VITAMIN B-12 PO) Place 1,000 mg into feeding tube daily.    . ferrous sulfate 325 (65 FE) MG tablet Place 325 mg into feeding tube every morning.    . Fluticasone-Salmeterol (ADVAIR) 250-50 MCG/DOSE AEPB Inhale 1 puff into the lungs 2 (two) times daily.   . folic acid (FOLVITE) 1 MG tablet Place 1 mg into feeding tube daily.    . furosemide (LASIX) 40 MG tablet Place 1 tablet (40 mg total) into feeding tube daily.   . hydrALAZINE (APRESOLINE) 100 MG tablet Place 1 tablet (100 mg total) into feeding tube every morning.   Marland Kitchen ipratropium-albuterol (DUONEB) 0.5-2.5 (3) MG/3ML SOLN Inhale 3 mLs into the lungs every 6 (six) hours as needed (shortness of breath).    . isosorbide dinitrate (ISORDIL) 40 MG tablet Place 40 mg into feeding tube daily.   . Multiple Vitamin (MULTIVITAMIN WITH MINERALS) TABS tablet Take 1 tablet by mouth daily. (Patient taking differently: Place 1 tablet into feeding tube daily. )   . nitroGLYCERIN (NITROSTAT) 0.4 MG SL tablet Place 0.4 mg under the tongue every 5 (five) minutes as needed for chest pain.   . Nutritional Supplements (FEEDING SUPPLEMENT, JEVITY 1.5 CAL/FIBER,) LIQD Place 240 mLs into feeding tube 3 (three) times daily. (Patient taking differently: Place 240 mLs into feeding tube 6 (six) times daily. )   . pantoprazole (PROTONIX) 40 MG tablet Take 1 tablet (40 mg total) by mouth daily.   Marland Kitchen QVAR 80 MCG/ACT inhaler Inhale 2 puffs into the lungs 2 (two) times daily.   . risperiDONE (RISPERDAL M-TABS) 1 MG disintegrating tablet Take 1 tablet (1 mg total) by mouth every evening.   .  thiamine 100 MG tablet Take 1 tablet (100 mg total) by mouth daily. (Patient taking differently: Place 100 mg into feeding tube daily. )   . [DISCONTINUED] pantoprazole sodium (PROTONIX) 40 mg/20 mL PACK Place 40 mg into feeding tube daily.    No facility-administered encounter medications on file as of 05/07/2015.     Allergies: No Known Allergies  Family History: Family History  Problem Relation Age of Onset  . Heart disease      No family history  . Cancer Father   . Hypertension Father   . Cancer Brother   . Cancer Sister   . Healthy Daughter     Social History: Social History  Substance Use Topics  . Smoking status: Former Smoker -- 0.50 packs/day for 40 years    Types:  Cigarettes  . Smokeless tobacco: Never Used  . Alcohol Use: 0.0 oz/week    0 Standard drinks or equivalent per week     Comment: previously admitted to 1 pint per day. denies use for 1 month.   Social History   Social History Narrative   Currently lives at [facility] rehab center.     Retired from being an Customer service manager.    Review of Systems:  CONSTITUTIONAL: No fevers, chills, night sweats, + 40lb weight loss.   EYES: No visual changes or eye pain ENT: No hearing changes.  No history of nose bleeds.   RESPIRATORY: No cough, wheezing and shortness of breath.   CARDIOVASCULAR: Negative for chest pain, and palpitations.   GI: Negative for abdominal discomfort, blood in stools or black stools.  No recent change in bowel habits.   GU:  No history of incontinence.   MUSCLOSKELETAL: +history of joint pain or swelling.  No myalgias.   SKIN: Negative for lesions, rash, and itching.   HEMATOLOGY/ONCOLOGY: Negative for prolonged bleeding, bruising easily, and swollen nodes.  No history of cancer.   ENDOCRINE: Negative for cold or heat intolerance, polydipsia or goiter.   PSYCH:  No depression or anxiety symptoms.   NEURO: As Above.   Vital Signs:  BP 130/78 mmHg  Pulse 64  Ht $R'5\' 9"'vq$  (1.753  m)  Wt 125 lb 3 oz (56.785 kg)  BMI 18.48 kg/m2  SpO2 99%  General Medical Exam:   General:  Thin and frail appearing, comfortable, arrived in wheelchair.   Eyes/ENT: see cranial nerve examination.   Neck: No masses appreciated.  Full range of motion without tenderness.  No carotid bruits. Respiratory:  Clear to auscultation, good air entry bilaterally.   Cardiac:  Regular rate and rhythm, no murmur.   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  Skin:  No rashes or lesions.  Neurological Exam: MENTAL STATUS including orientation to time, place, person, recent and remote memory, attention span and concentration, language, and fund of knowledge is fair.  Blunted affect.  Speech is hypophonic and spastic and dysarthric.  CRANIAL NERVES: II:  Blinks to visual threat in all fields.  Small pupils, limited fundoscopic exam.   III-IV-VI: Pupils equal round and reactive to light.  Severe vertical gaze paresis, lateral gaze intact.  Moderate left ptosis.   V:  Normal facial sensation.  Jaw jerk is present.   VII:  Normal facial symmetry and movements. Myersons, palmomental, and Snout is present.    VIII:  Normal hearing and vestibular function.   IX-X:  Normal palatal movement.   XI:  Normal shoulder shrug and head rotation.   XII:  Normal tongue strength and range of motion, no deviation or fasciculation.  Slowed tongue movements.  MOTOR:  There is generalized pattern of reduced muscle bulk throughout.  Moderate rigidity of the upper and lower extremities and the neck.  No fasciculations or abnormal movements.  No pronator drift.    Right Upper Extremity:    Left Upper Extremity:    Deltoid  5/5   Deltoid  5/5   Biceps  5/5   Biceps  5/5   Triceps  5/5   Triceps  5/5   Wrist extensors  5/5   Wrist extensors  5/5   Wrist flexors  5/5   Wrist flexors  5/5   Finger extensors  5/5   Finger extensors  5/5   Finger flexors  5/5   Finger flexors  5/5     Dorsal interossei  5-/5   Dorsal interossei  4+/5   Abductor pollicis  5-/5   Abductor pollicis  5-/5    Right Lower Extremity:    Left Lower Extremity:    Hip flexors  5/5   Hip flexors  5/5   Hip extensors  5/5   Hip extensors  5/5   Knee flexors  5/5   Knee flexors  5/5   Knee extensors  5/5   Knee extensors  5/5   Dorsiflexors  5-/5   Dorsiflexors  5-/5   Plantarflexors  5-/5   Plantarflexors  5-/5   Toe extensors  5-/5   Toe extensors  5-/5   Toe flexors  5-/5   Toe flexors  5-/5    MSRs:  Right                                                                 Left brachioradialis 3+  brachioradialis 3+  biceps 3+  biceps 3+  triceps 3+  triceps 3+  patellar 3+  patellar 3+  ankle jerk 0  ankle jerk 0  plantar response mute  plantar response mute   SENSORY:  Reduced vibration at the toes bilaterally.  Pin prick intact throughout.  COORDINATION/GAIT: Normal finger-to- nose-finger.  Mild slowing and reduced amplitude of finger tapping bilaterally. Requires one-person assist to stand.  Unsteady with standing.  Due to instability, gait was not tested.   IMPRESSION: Mr. Knotts is a 69 year-old gentleman referred for progressive history of gait instability, fall, dysarthria and dysphagia.  His exam shows axial and appendicular rigidity, hyperreflexia, pathological facial reflexes, dysarthria, and gait instability.  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  I do want to exclude other upper motor neuron pathology, so will order MRI brain without contrast.  Primary lateral sclerosis seems unlikely, but we also discussed doing NCS/EMG to exclude motor neuron disease mimics.    I had extensive discussion with the patient regarding the pathogenesis, etiology, management, and natural course of progressive supranuclear palsy and that there is no treatment, so management is  symptomatic.  At this time, he denies any pain.  He is already doing speech therapy, PT, and OT at the facility.  He is tolerating tube feeds and we discussed the risks of aspiration and choking, if he were to eat by mouth. Palliative care team has already met with patient and family and discussed that goals of care is quality of life.  Daughter is still working on setting up Scientist, physiological.   PLAN/RECOMMENDATIONS:  1.  MRI brain wo contrast 2.  Start trial of sinemet 25/100 half tablet three times daily.  Hold tube feeds 30-min prior to administration and 30-min after it has been given.  If he is tolerating this medication after one week, increase to 1 tablet three times daily.   3.  Information on advanced directives provided 4.  Handicap placard given  Return to clinic in 2-3 months.   The duration of this appointment visit was 60 minutes of face-to-face time with the patient.  Greater than 50% of this time was spent in counseling, explanation of  diagnosis, planning of further management, and coordination of care.   Thank you for allowing me to participate in patient's care.  If I can answer any additional questions, I would be pleased to do so.    Sincerely,    Donika K. Posey Pronto, DO

## 2015-05-07 NOTE — Patient Instructions (Addendum)
1.  MRI brain without contrast 2.  Start sinemet 25/100 half tablet three times daily.  Hold tube feeds 30-min prior to administration and 30-min after it has been given.  If he is tolerating this medication after one week, increase to 1 tablet three times daily.   3.  Information on advanced directives provided 4.  Handicap placard given  Return to clinic in 2-3 months

## 2015-05-08 ENCOUNTER — Ambulatory Visit: Payer: Medicare HMO | Admitting: Internal Medicine

## 2015-05-08 ENCOUNTER — Inpatient Hospital Stay (HOSPITAL_COMMUNITY)
Admission: EM | Admit: 2015-05-08 | Discharge: 2015-05-13 | DRG: 377 | Disposition: A | Discharge status: Skilled Nursing Facility | Payer: Medicare HMO | Attending: Internal Medicine | Admitting: Internal Medicine

## 2015-05-08 ENCOUNTER — Encounter (HOSPITAL_COMMUNITY): Payer: Self-pay | Admitting: Emergency Medicine

## 2015-05-08 DIAGNOSIS — R531 Weakness: Secondary | ICD-10-CM | POA: Diagnosis present

## 2015-05-08 DIAGNOSIS — N184 Chronic kidney disease, stage 4 (severe): Secondary | ICD-10-CM | POA: Diagnosis present

## 2015-05-08 DIAGNOSIS — K449 Diaphragmatic hernia without obstruction or gangrene: Secondary | ICD-10-CM | POA: Diagnosis present

## 2015-05-08 DIAGNOSIS — Z681 Body mass index (BMI) 19 or less, adult: Secondary | ICD-10-CM | POA: Diagnosis not present

## 2015-05-08 DIAGNOSIS — Z809 Family history of malignant neoplasm, unspecified: Secondary | ICD-10-CM | POA: Diagnosis not present

## 2015-05-08 DIAGNOSIS — Z7982 Long term (current) use of aspirin: Secondary | ICD-10-CM | POA: Diagnosis not present

## 2015-05-08 DIAGNOSIS — M109 Gout, unspecified: Secondary | ICD-10-CM | POA: Diagnosis present

## 2015-05-08 DIAGNOSIS — Z87891 Personal history of nicotine dependence: Secondary | ICD-10-CM

## 2015-05-08 DIAGNOSIS — E440 Moderate protein-calorie malnutrition: Secondary | ICD-10-CM | POA: Diagnosis not present

## 2015-05-08 DIAGNOSIS — I252 Old myocardial infarction: Secondary | ICD-10-CM | POA: Diagnosis not present

## 2015-05-08 DIAGNOSIS — R634 Abnormal weight loss: Secondary | ICD-10-CM

## 2015-05-08 DIAGNOSIS — E785 Hyperlipidemia, unspecified: Secondary | ICD-10-CM | POA: Diagnosis present

## 2015-05-08 DIAGNOSIS — E43 Unspecified severe protein-calorie malnutrition: Secondary | ICD-10-CM | POA: Diagnosis present

## 2015-05-08 DIAGNOSIS — K921 Melena: Principal | ICD-10-CM | POA: Diagnosis present

## 2015-05-08 DIAGNOSIS — R64 Cachexia: Secondary | ICD-10-CM | POA: Diagnosis present

## 2015-05-08 DIAGNOSIS — K648 Other hemorrhoids: Secondary | ICD-10-CM | POA: Diagnosis present

## 2015-05-08 DIAGNOSIS — R1310 Dysphagia, unspecified: Secondary | ICD-10-CM | POA: Diagnosis not present

## 2015-05-08 DIAGNOSIS — G231 Progressive supranuclear ophthalmoplegia [Steele-Richardson-Olszewski]: Secondary | ICD-10-CM | POA: Diagnosis present

## 2015-05-08 DIAGNOSIS — M1711 Unilateral primary osteoarthritis, right knee: Secondary | ICD-10-CM | POA: Diagnosis present

## 2015-05-08 DIAGNOSIS — I2510 Atherosclerotic heart disease of native coronary artery without angina pectoris: Secondary | ICD-10-CM | POA: Diagnosis present

## 2015-05-08 DIAGNOSIS — D649 Anemia, unspecified: Secondary | ICD-10-CM | POA: Diagnosis present

## 2015-05-08 DIAGNOSIS — L89151 Pressure ulcer of sacral region, stage 1: Secondary | ICD-10-CM | POA: Diagnosis present

## 2015-05-08 DIAGNOSIS — I130 Hypertensive heart and chronic kidney disease with heart failure and stage 1 through stage 4 chronic kidney disease, or unspecified chronic kidney disease: Secondary | ICD-10-CM | POA: Diagnosis present

## 2015-05-08 DIAGNOSIS — I5043 Acute on chronic combined systolic (congestive) and diastolic (congestive) heart failure: Secondary | ICD-10-CM | POA: Diagnosis present

## 2015-05-08 DIAGNOSIS — Z79899 Other long term (current) drug therapy: Secondary | ICD-10-CM

## 2015-05-08 DIAGNOSIS — N179 Acute kidney failure, unspecified: Secondary | ICD-10-CM | POA: Diagnosis present

## 2015-05-08 DIAGNOSIS — Z8249 Family history of ischemic heart disease and other diseases of the circulatory system: Secondary | ICD-10-CM | POA: Diagnosis not present

## 2015-05-08 DIAGNOSIS — Z951 Presence of aortocoronary bypass graft: Secondary | ICD-10-CM

## 2015-05-08 DIAGNOSIS — K644 Residual hemorrhoidal skin tags: Secondary | ICD-10-CM | POA: Diagnosis present

## 2015-05-08 DIAGNOSIS — L8990 Pressure ulcer of unspecified site, unspecified stage: Secondary | ICD-10-CM | POA: Insufficient documentation

## 2015-05-08 LAB — COMPREHENSIVE METABOLIC PANEL
ALK PHOS: 65 U/L (ref 38–126)
ALT: 11 U/L — AB (ref 17–63)
AST: 30 U/L (ref 15–41)
Albumin: 3.6 g/dL (ref 3.5–5.0)
Anion gap: 10 (ref 5–15)
BILIRUBIN TOTAL: 0.7 mg/dL (ref 0.3–1.2)
BUN: 87 mg/dL — ABNORMAL HIGH (ref 6–20)
CALCIUM: 9 mg/dL (ref 8.9–10.3)
CO2: 30 mmol/L (ref 22–32)
CREATININE: 2.59 mg/dL — AB (ref 0.61–1.24)
Chloride: 95 mmol/L — ABNORMAL LOW (ref 101–111)
GFR, EST AFRICAN AMERICAN: 27 mL/min — AB (ref >60)
GFR, EST NON AFRICAN AMERICAN: 23 mL/min — AB (ref >60)
Glucose, Bld: 90 mg/dL (ref 65–99)
Potassium: 4.9 mmol/L (ref 3.5–5.1)
Sodium: 135 mmol/L (ref 135–145)
TOTAL PROTEIN: 7 g/dL (ref 6.5–8.1)

## 2015-05-08 LAB — CBC WITH DIFFERENTIAL/PLATELET
BASOS PCT: 0 %
Basophils Absolute: 0 10*3/uL (ref 0.0–0.1)
Eosinophils Absolute: 0.1 10*3/uL (ref 0.0–0.7)
Eosinophils Relative: 2 %
HCT: 20.8 % — ABNORMAL LOW (ref 39.0–52.0)
HEMOGLOBIN: 6.4 g/dL — AB (ref 13.0–17.0)
LYMPHS ABS: 1.8 10*3/uL (ref 0.7–4.0)
LYMPHS PCT: 36 %
MCH: 24.9 pg — AB (ref 26.0–34.0)
MCHC: 30.8 g/dL (ref 30.0–36.0)
MCV: 80.9 fL (ref 78.0–100.0)
MONOS PCT: 6 %
Monocytes Absolute: 0.3 10*3/uL (ref 0.1–1.0)
NEUTROS ABS: 2.8 10*3/uL (ref 1.7–7.7)
Neutrophils Relative %: 56 %
Platelets: 300 10*3/uL (ref 150–400)
RBC: 2.57 MIL/uL — ABNORMAL LOW (ref 4.22–5.81)
RDW: 17.1 % — ABNORMAL HIGH (ref 11.5–15.5)
WBC: 5 10*3/uL (ref 4.0–10.5)

## 2015-05-08 LAB — POC OCCULT BLOOD, ED: Fecal Occult Bld: POSITIVE — AB

## 2015-05-08 LAB — PREPARE RBC (CROSSMATCH)

## 2015-05-08 LAB — PROTIME-INR
INR: 1.19 (ref 0.00–1.49)
PROTHROMBIN TIME: 15.3 s — AB (ref 11.6–15.2)

## 2015-05-08 LAB — MRSA PCR SCREENING: MRSA BY PCR: NEGATIVE

## 2015-05-08 LAB — APTT: aPTT: 35 seconds (ref 24–37)

## 2015-05-08 MED ORDER — SODIUM CHLORIDE 0.9 % IV SOLN
INTRAVENOUS | Status: DC
  Administered 2015-05-08: 10 mL/h via INTRAVENOUS

## 2015-05-08 MED ORDER — ATORVASTATIN CALCIUM 40 MG PO TABS
80.0000 mg | ORAL_TABLET | Freq: Every day | ORAL | Status: DC
  Administered 2015-05-08 – 2015-05-12 (×5): 80 mg
  Filled 2015-05-08 (×5): qty 2

## 2015-05-08 MED ORDER — CARVEDILOL 25 MG PO TABS
25.0000 mg | ORAL_TABLET | Freq: Two times a day (BID) | ORAL | Status: DC
  Administered 2015-05-08 – 2015-05-13 (×10): 25 mg
  Filled 2015-05-08 (×10): qty 1

## 2015-05-08 MED ORDER — ADULT MULTIVITAMIN W/MINERALS CH
1.0000 | ORAL_TABLET | Freq: Every day | ORAL | Status: DC
  Administered 2015-05-09 – 2015-05-13 (×5): 1
  Filled 2015-05-08 (×5): qty 1

## 2015-05-08 MED ORDER — AMLODIPINE BESYLATE 10 MG PO TABS
10.0000 mg | ORAL_TABLET | Freq: Every day | ORAL | Status: DC
  Administered 2015-05-09 – 2015-05-13 (×5): 10 mg
  Filled 2015-05-08 (×5): qty 1

## 2015-05-08 MED ORDER — ONDANSETRON HCL 4 MG PO TABS: 4.0000 mg | ORAL_TABLET | Freq: Four times a day (QID) | ORAL | Status: DC | PRN

## 2015-05-08 MED ORDER — PANTOPRAZOLE SODIUM 40 MG PO PACK
40.0000 mg | PACK | Freq: Every day | ORAL | Status: DC
  Administered 2015-05-09 – 2015-05-13 (×5): 40 mg
  Filled 2015-05-08 (×5): qty 20

## 2015-05-08 MED ORDER — BECLOMETHASONE DIPROPIONATE 80 MCG/ACT IN AERS: 2.0000 | INHALATION_SPRAY | Freq: Two times a day (BID) | RESPIRATORY_TRACT | Status: DC

## 2015-05-08 MED ORDER — OXYCODONE HCL 5 MG PO TABS
5.0000 mg | ORAL_TABLET | ORAL | Status: AC
  Administered 2015-05-08: 5 mg via ORAL
  Filled 2015-05-08: qty 1

## 2015-05-08 MED ORDER — SODIUM CHLORIDE 0.9 % IV SOLN
Freq: Once | INTRAVENOUS | Status: AC
  Administered 2015-05-08: 19:00:00 via INTRAVENOUS

## 2015-05-08 MED ORDER — COLCHICINE 0.6 MG PO TABS
0.6000 mg | ORAL_TABLET | Freq: Every day | ORAL | Status: DC
  Administered 2015-05-08 – 2015-05-13 (×6): 0.6 mg
  Filled 2015-05-08 (×6): qty 1

## 2015-05-08 MED ORDER — JEVITY 1.5 CAL/FIBER PO LIQD
240.0000 mL | Freq: Three times a day (TID) | ORAL | Status: DC
  Administered 2015-05-09: 240 mL
  Filled 2015-05-08 (×4): qty 1000

## 2015-05-08 MED ORDER — MORPHINE SULFATE (PF) 2 MG/ML IV SOLN: 1.0000 mg | INTRAVENOUS | Status: DC | PRN

## 2015-05-08 MED ORDER — ONDANSETRON HCL 4 MG/2ML IJ SOLN: 4.0000 mg | Freq: Four times a day (QID) | INTRAMUSCULAR | Status: DC | PRN

## 2015-05-08 MED ORDER — IPRATROPIUM-ALBUTEROL 0.5-2.5 (3) MG/3ML IN SOLN: 3.0000 mL | Freq: Four times a day (QID) | RESPIRATORY_TRACT | Status: DC | PRN

## 2015-05-08 MED ORDER — BUDESONIDE 0.5 MG/2ML IN SUSP
0.5000 mg | Freq: Two times a day (BID) | RESPIRATORY_TRACT | Status: DC
  Administered 2015-05-08 – 2015-05-13 (×10): 0.5 mg via RESPIRATORY_TRACT
  Filled 2015-05-08 (×11): qty 2

## 2015-05-08 MED ORDER — NITROGLYCERIN 0.4 MG SL SUBL: 0.4000 mg | SUBLINGUAL_TABLET | SUBLINGUAL | Status: DC | PRN

## 2015-05-08 MED ORDER — CARBIDOPA-LEVODOPA 25-100 MG PO TABS
0.5000 | ORAL_TABLET | Freq: Three times a day (TID) | ORAL | Status: DC
  Administered 2015-05-08 – 2015-05-13 (×14): 0.5
  Filled 2015-05-08 (×14): qty 1

## 2015-05-08 MED ORDER — FOLIC ACID 1 MG PO TABS
1.0000 mg | ORAL_TABLET | Freq: Every day | ORAL | Status: DC
  Administered 2015-05-09 – 2015-05-13 (×5): 1 mg
  Filled 2015-05-08 (×5): qty 1

## 2015-05-08 MED ORDER — ALLOPURINOL 100 MG PO TABS
100.0000 mg | ORAL_TABLET | Freq: Every day | ORAL | Status: DC
  Administered 2015-05-09 – 2015-05-13 (×5): 100 mg
  Filled 2015-05-08 (×5): qty 1

## 2015-05-08 MED ORDER — MOMETASONE FURO-FORMOTEROL FUM 100-5 MCG/ACT IN AERO
2.0000 | INHALATION_SPRAY | Freq: Two times a day (BID) | RESPIRATORY_TRACT | Status: DC
  Administered 2015-05-08 – 2015-05-13 (×10): 2 via RESPIRATORY_TRACT
  Filled 2015-05-08: qty 8.8

## 2015-05-08 MED ORDER — ISOSORBIDE DINITRATE 20 MG PO TABS
40.0000 mg | ORAL_TABLET | Freq: Every day | ORAL | Status: DC
  Administered 2015-05-09 – 2015-05-13 (×5): 40 mg
  Filled 2015-05-08 (×5): qty 2

## 2015-05-08 NOTE — H&P (Addendum)
[facility] History and Physical  Oneill Bais UJW:[id number] DOB: [birth year] DOA: 05/08/2015  Referring physician: EDP PCP: Pcp Not In System   Chief Complaint: low Hb  HPI: Martin Cain is a 69 y.o. 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 He was admitted to [facility] in [place] in September for anemia, underwent an EGD then which was reportedly unremarkable, had a colonoscopy 1 year ago-which was unrevealing as well based on last hospitalization notes, he was discharged with the plan to have a capsule endoscopy as outpatient. In the interim he moved to [facility] and was admitted here again with anemia, was seen by Dr. Michail Sermon, capsule endoscopy was planned however he was found to have severe dysphagia which has been gradually progressive, subsequently underwent placement of a for PEG tube by interventional radiology on 10/25. During this hospitalization he was also seen by palliative care, he was made DO NOT RESUSCITATE and subsequently discharged to skilled nursing facility with palliative care services. 2 days ago his family, primarily his daughter changed the MOST form and CODE STATUS to full code and full aggressive scope of treatment. He had repeat labs drawn at the nursing facility today and was sent in for hemoglobin in the 6 range. He denies any overt bleeding, no melena, reports that his G-tube is working okay. In the ER, labs notable for hemoglobin of 6.4 which is down from discharge of 9.0 less than 10 days ago, he was found to be Hemoccult-positive.   Review of Systems:  positives bolded  Constitutional:  No weight loss, night sweats, Fevers, chills, fatigue.  HEENT:  No headaches, Difficulty swallowing,Tooth/dental problems,Sore throat,  No sneezing, itching, ear ache, nasal congestion, post nasal drip,  Cardio-vascular:    No chest pain, Orthopnea, PND, swelling in lower extremities, anasarca, dizziness, palpitations  GI:  No heartburn, indigestion, abdominal pain, nausea, vomiting, diarrhea, change in bowel habits, loss of appetite  Resp:  No shortness of breath with exertion or at rest. No excess mucus, no productive cough, No non-productive cough, No coughing up of blood.No change in color of mucus.No wheezing.No chest wall deformity  Skin:  no rash or lesions.  GU:  no dysuria, change in color of urine, no urgency or frequency. No flank pain.  Musculoskeletal:  No joint pain or swelling. No decreased range of motion. No back pain.  Psych:  No change in mood or affect. No depression or anxiety. No memory loss.   Past Medical History  Diagnosis Date  . Hypertension   . Hyperlipidemia   . CKD (chronic kidney disease)     a. Probable stage III.  Marland Kitchen CAD (coronary artery disease)     a. CABG 2000 in [place]. b. NSTEMI 04/2013 felt due to malignant hypertension (was instructed to f/u ATL for stress test).  . Gout   . CHF (congestive heart failure) ([place])     a. Echo 04/2013: EF 45-50%, mild AS, mild biatrial enlargement, sev LVH.  . Osteoarthritis     R knee  . Anemia    Past Surgical History  Procedure Laterality Date  . Cardiac surgery      2000; [place]  . Appendectomy    . Abdominal exploration surgery    . Tonsillectomy     Social History:  reports that he has quit smoking. His smoking use included Cigarettes. He has a 20 pack-year smoking history. He has  never used smokeless tobacco. He reports that he drinks alcohol. He reports that he does not use illicit drugs.  No Known Allergies  Family History  Problem Relation Age of Onset  . Heart disease      No family history  . Cancer Father   . Hypertension Father   . Cancer Brother   . Cancer Sister   . Healthy Daughter     Prior to Admission medications   Medication Sig Start Date End Date Taking? Authorizing Provider  allopurinol  (ZYLOPRIM) 100 MG tablet Place 1 tablet (100 mg total) into feeding tube daily. 04/24/15  Yes Maryann Mikhail, DO  amLODipine (NORVASC) 10 MG tablet Place 1 tablet (10 mg total) into feeding tube daily. 04/24/15  Yes Maryann Mikhail, DO  atorvastatin (LIPITOR) 80 MG tablet Place 1 tablet (80 mg total) into feeding tube daily. 04/24/15  Yes Maryann Mikhail, DO  carbidopa-levodopa (SINEMET) 25-100 MG tablet Take 0.5 tablets by mouth 3 (three) times daily. 05/07/15  Yes Donika K Patel, DO  carvedilol (COREG) 25 MG tablet Place 1 tablet (25 mg total) into feeding tube 2 (two) times daily. 04/24/15  Yes Maryann Mikhail, DO  Cholecalciferol (VITAMIN D PO) Place 1,000 mg into feeding tube daily.    Yes Historical Provider, MD  colchicine 0.6 MG tablet Place 0.6 mg into feeding tube daily.    Yes Historical Provider, MD  [facility] ASPIRIN LOW DOSE 81 MG EC tablet Take 81 mg by mouth daily. 04/11/15  Yes Historical Provider, MD  Cyanocobalamin (VITAMIN B-12 PO) Place 1,000 mg into feeding tube daily.    Yes Historical Provider, MD  ferrous sulfate 325 (65 FE) MG tablet Place 325 mg into feeding tube every morning.  04/11/15  Yes Historical Provider, MD  Fluticasone-Salmeterol (ADVAIR) 250-50 MCG/DOSE AEPB Inhale 1 puff into the lungs 2 (two) times daily. 06/09/13  Yes Dorothy Spark, MD  folic acid (FOLVITE) 1 MG tablet Place 1 mg into feeding tube daily.  04/11/15  Yes Historical Provider, MD  furosemide (LASIX) 40 MG tablet Place 1 tablet (40 mg total) into feeding tube daily. 04/24/15  Yes Maryann Mikhail, DO  hydrALAZINE (APRESOLINE) 100 MG tablet Place 1 tablet (100 mg total) into feeding tube every morning. 04/24/15  Yes Maryann Mikhail, DO  ipratropium-albuterol (DUONEB) 0.5-2.5 (3) MG/3ML SOLN Inhale 3 mLs into the lungs every 6 (six) hours as needed (shortness of breath).  04/11/15  Yes Historical Provider, MD  isosorbide dinitrate (ISORDIL) 40 MG tablet Place 40 mg into feeding tube daily.   Yes  Historical Provider, MD  Multiple Vitamin (MULTIVITAMIN WITH MINERALS) TABS tablet Take 1 tablet by mouth daily. Patient taking differently: Place 1 tablet into feeding tube daily.  04/24/15  Yes Maryann Mikhail, DO  nitroGLYCERIN (NITROSTAT) 0.4 MG SL tablet Place 0.4 mg under the tongue every 5 (five) minutes as needed for chest pain.   Yes Historical Provider, MD  Nutritional Supplements (FEEDING SUPPLEMENT, JEVITY 1.5 CAL/FIBER,) LIQD Place 240 mLs into feeding tube 3 (three) times daily. Patient taking differently: Place 240 mLs into feeding tube 6 (six) times daily.  04/24/15  Yes Maryann Mikhail, DO  pantoprazole sodium (PROTONIX) 40 mg/20 mL PACK Place 40 mg into feeding tube daily. In 30 ml of applesauce.   Yes Historical Provider, MD  QVAR 80 MCG/ACT inhaler Inhale 2 puffs into the lungs 2 (two) times daily. 04/11/15  Yes Historical Provider, MD  risperiDONE (RISPERDAL M-TABS) 1 MG disintegrating tablet Take 1 tablet (1 mg total)  by mouth every evening. 05/31/13  Yes Adeline Saralyn Pilar, MD  thiamine 100 MG tablet Take 1 tablet (100 mg total) by mouth daily. Patient taking differently: Place 100 mg into feeding tube daily.  04/24/15  Yes Maryann Mikhail, DO  albuterol (PROVENTIL HFA;VENTOLIN HFA) 108 (90 BASE) MCG/ACT inhaler Inhale 2 puffs into the lungs every 6 (six) hours as needed for wheezing or shortness of breath. Patient not taking: Reported on 05/08/2015 06/28/13   Dorothy Spark, MD  pantoprazole (PROTONIX) 40 MG tablet Take 1 tablet (40 mg total) by mouth daily. Patient not taking: Reported on 05/08/2015 06/22/13   Ripudeep Krystal Eaton, MD   Physical Exam: Filed Vitals:   05/08/15 1515 05/08/15 1600  BP: 128/82 129/59  Pulse: 58 66  Temp: 98.2 F (36.8 C)   TempSrc: Oral   Resp: 16 14  SpO2: 99% 98%    Wt Readings from Last 3 Encounters:  05/07/15 56.785 kg (125 lb 3 oz)  04/25/15 54.568 kg (120 lb 4.8 oz)  04/17/15 67.405 kg (148 lb 9.6 oz)    General:  Appears calm and  comfortable, AAO 2, no distress, emaciated tall gentleman, mild dysarthria noted Eyes: PERRL, normal lids, irises & conjunctiva ENT: grossly normal hearing, lips & tongue Neck: no LAD, masses or thyromegaly Cardiovascular: RRR, no m/r/g. Tachycardic emetry: SR, no arrhythmias  Respiratory: CTA bilaterally, no w/r/r. Normal respiratory effort. Abdomen: soft, scaphoid, nontender, PEG tube noted Skin: no rash or induration seen on limited exam Musculoskeletal: grossly normal tone BUE/BLE Neurologic: grossly non-focal.          Labs on Admission:  Basic Metabolic Panel:  Recent Labs Lab 05/08/15 1611  NA 135  K 4.9  CL 95*  CO2 30  GLUCOSE 90  BUN 87*  CREATININE 2.59*  CALCIUM 9.0   Liver Function Tests:  Recent Labs Lab 05/08/15 1611  AST 30  ALT 11*  ALKPHOS 65  BILITOT 0.7  PROT 7.0  ALBUMIN 3.6   No results for input(s): LIPASE, AMYLASE in the last 168 hours. No results for input(s): AMMONIA in the last 168 hours. CBC:  Recent Labs Lab 05/08/15 1611  WBC 5.0  NEUTROABS PENDING  HGB 6.4*  HCT 20.8*  MCV 80.9  PLT 300   Cardiac Enzymes: No results for input(s): CKTOTAL, CKMB, CKMBINDEX, TROPONINI in the last 168 hours.  BNP (last 3 results)  Recent Labs  04/17/15 2216  BNP 2795.6*    ProBNP (last 3 results) No results for input(s): PROBNP in the last 8760 hours.  CBG: No results for input(s): GLUCAP in the last 168 hours.  Radiological Exams on Admission: No results found.  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 -hold ACE  H/o CAD/CABG -Hold aspirin, continue Coreg, statin, Imdur  Code Status: Full Code DVT  Prophylaxis: SCDs Family Communication: none at bedside,, called and d/w daughter Disposition Plan: admit to inpatient  Time spent: 19mn  Treyshon Buchanon [facility] Pager [phone]

## 2015-05-08 NOTE — ED Notes (Addendum)
[facility] for generalized fatigue. Labs at facility report HBG 7.2  Last VS: 120/90, 72hr, resp 18

## 2015-05-08 NOTE — ED Notes (Signed)
Bed: [id number] Expected date:  Expected time:  Means of arrival:  Comments: [facility]

## 2015-05-08 NOTE — ED Notes (Signed)
17:48 

## 2015-05-08 NOTE — ED Provider Notes (Signed)
CSN: [id number]     Arrival date & time 05/08/15  1446 History   First MD Initiated Contact with Patient 05/08/15 1515     No chief complaint on file.    (Consider location/radiation/quality/duration/timing/severity/associated sxs/prior Treatment) HPI Comments: Pt here w/ weakness x several days Blood work at [facility] showed Hb of 7.2 Pt denies hematemeis, bloody stools No prior h/o PUD or LGB, but has been anemic before requiring transfusion No current abd pain, fever. Sx persistent and nothing makes them better but weakness worse with exertion and a/w some dyspnea No tx used pta  The history is provided by the patient.    Past Medical History  Diagnosis Date  . Hypertension   . Hyperlipidemia   . CKD (chronic kidney disease)     a. Probable stage III.  Marland Kitchen CAD (coronary artery disease)     a. CABG 2000 in [place]. b. NSTEMI 04/2013 felt due to malignant hypertension (was instructed to f/u ATL for stress test).  . Gout   . CHF (congestive heart failure) ([facility])     a. Echo 04/2013: EF 45-50%, mild AS, mild biatrial enlargement, sev LVH.  . Osteoarthritis     R knee  . Anemia    Past Surgical History  Procedure Laterality Date  . Cardiac surgery      2000; [place]  . Appendectomy    . Abdominal exploration surgery    . Tonsillectomy     Family History  Problem Relation Age of Onset  . Heart disease      No family history  . Cancer Father   . Hypertension Father   . Cancer Brother   . Cancer Sister   . Healthy Daughter    Social History  Substance Use Topics  . Smoking status: Former Smoker -- 0.50 packs/day for 40 years    Types: Cigarettes  . Smokeless tobacco: Never Used  . Alcohol Use: 0.0 oz/week    0 Standard drinks or equivalent per week     Comment: previously admitted to 1 pint per day. denies use for 1 month.    Review of Systems  All other systems reviewed and are negative.     Allergies  Review of patient's allergies indicates no known  allergies.  Home Medications   Prior to Admission medications   Medication Sig Start Date End Date Taking? Authorizing Provider  allopurinol (ZYLOPRIM) 100 MG tablet Place 1 tablet (100 mg total) into feeding tube daily. 04/24/15  Yes Maryann Mikhail, DO  amLODipine (NORVASC) 10 MG tablet Place 1 tablet (10 mg total) into feeding tube daily. 04/24/15  Yes Maryann Mikhail, DO  atorvastatin (LIPITOR) 80 MG tablet Place 1 tablet (80 mg total) into feeding tube daily. 04/24/15  Yes Maryann Mikhail, DO  carbidopa-levodopa (SINEMET) 25-100 MG tablet Take 0.5 tablets by mouth 3 (three) times daily. 05/07/15  Yes Donika K Patel, DO  carvedilol (COREG) 25 MG tablet Place 1 tablet (25 mg total) into feeding tube 2 (two) times daily. 04/24/15  Yes Maryann Mikhail, DO  Cholecalciferol (VITAMIN D PO) Place 1,000 mg into feeding tube daily.    Yes Historical Provider, MD  colchicine 0.6 MG tablet Place 0.6 mg into feeding tube daily.    Yes Historical Provider, MD  [facility] ASPIRIN LOW DOSE 81 MG EC tablet Take 81 mg by mouth daily. 04/11/15  Yes Historical Provider, MD  Cyanocobalamin (VITAMIN B-12 PO) Place 1,000 mg into feeding tube daily.    Yes Historical Provider, MD  ferrous  sulfate 325 (65 FE) MG tablet Place 325 mg into feeding tube every morning.  04/11/15  Yes Historical Provider, MD  Fluticasone-Salmeterol (ADVAIR) 250-50 MCG/DOSE AEPB Inhale 1 puff into the lungs 2 (two) times daily. 06/09/13  Yes Lars Masson, MD  folic acid (FOLVITE) 1 MG tablet Place 1 mg into feeding tube daily.  04/11/15  Yes Historical Provider, MD  furosemide (LASIX) 40 MG tablet Place 1 tablet (40 mg total) into feeding tube daily. 04/24/15  Yes Maryann Mikhail, DO  hydrALAZINE (APRESOLINE) 100 MG tablet Place 1 tablet (100 mg total) into feeding tube every morning. 04/24/15  Yes Maryann Mikhail, DO  ipratropium-albuterol (DUONEB) 0.5-2.5 (3) MG/3ML SOLN Inhale 3 mLs into the lungs every 6 (six) hours as needed (shortness  of breath).  04/11/15  Yes Historical Provider, MD  isosorbide dinitrate (ISORDIL) 40 MG tablet Place 40 mg into feeding tube daily.   Yes Historical Provider, MD  Multiple Vitamin (MULTIVITAMIN WITH MINERALS) TABS tablet Take 1 tablet by mouth daily. Patient taking differently: Place 1 tablet into feeding tube daily.  04/24/15  Yes Maryann Mikhail, DO  nitroGLYCERIN (NITROSTAT) 0.4 MG SL tablet Place 0.4 mg under the tongue every 5 (five) minutes as needed for chest pain.   Yes Historical Provider, MD  Nutritional Supplements (FEEDING SUPPLEMENT, JEVITY 1.5 CAL/FIBER,) LIQD Place 240 mLs into feeding tube 3 (three) times daily. Patient taking differently: Place 240 mLs into feeding tube 6 (six) times daily.  04/24/15  Yes Maryann Mikhail, DO  pantoprazole sodium (PROTONIX) 40 mg/20 mL PACK Place 40 mg into feeding tube daily. In 30 ml of applesauce.   Yes Historical Provider, MD  QVAR 80 MCG/ACT inhaler Inhale 2 puffs into the lungs 2 (two) times daily. 04/11/15  Yes Historical Provider, MD  risperiDONE (RISPERDAL M-TABS) 1 MG disintegrating tablet Take 1 tablet (1 mg total) by mouth every evening. 05/31/13  Yes Adeline Joselyn Glassman, MD  thiamine 100 MG tablet Take 1 tablet (100 mg total) by mouth daily. Patient taking differently: Place 100 mg into feeding tube daily.  04/24/15  Yes Maryann Mikhail, DO  albuterol (PROVENTIL HFA;VENTOLIN HFA) 108 (90 BASE) MCG/ACT inhaler Inhale 2 puffs into the lungs every 6 (six) hours as needed for wheezing or shortness of breath. Patient not taking: Reported on 05/08/2015 06/28/13   Lars Masson, MD  pantoprazole (PROTONIX) 40 MG tablet Take 1 tablet (40 mg total) by mouth daily. Patient not taking: Reported on 05/08/2015 06/22/13   Ripudeep K Rai, MD   BP 128/82 mmHg  Pulse 58  Temp(Src) 98.2 F (36.8 C) (Oral)  Resp 16  SpO2 99% Physical Exam  Constitutional: He is oriented to person, place, and time. He appears cachectic.  Non-toxic appearance. No  distress.  HENT:  Head: Normocephalic and atraumatic.  Eyes: Conjunctivae, EOM and lids are normal. Pupils are equal, round, and reactive to light.  Conjunctiva pale  Neck: Normal range of motion. Neck supple. No tracheal deviation present. No thyroid mass present.  Cardiovascular: Normal rate, regular rhythm and normal heart sounds.  Exam reveals no gallop.   No murmur heard. Pulmonary/Chest: Effort normal and breath sounds normal. No stridor. No respiratory distress. He has no decreased breath sounds. He has no wheezes. He has no rhonchi. He has no rales.  Abdominal: Soft. Normal appearance and bowel sounds are normal. He exhibits no distension. There is no tenderness. There is no rebound and no CVA tenderness.  Musculoskeletal: Normal range of motion. He exhibits no edema  or tenderness.  Neurological: He is alert and oriented to person, place, and time. He has normal strength. No cranial nerve deficit or sensory deficit. GCS eye subscore is 4. GCS verbal subscore is 5. GCS motor subscore is 6.  Skin: Skin is warm and dry. No abrasion and no rash noted.  Psychiatric: His behavior is normal. His affect is blunt. His speech is delayed.  Nursing note and vitals reviewed.   ED Course  Procedures (including critical care time) Labs Review Labs Reviewed  CBC WITH DIFFERENTIAL/PLATELET  COMPREHENSIVE METABOLIC PANEL  PROTIME-INR  APTT  POC OCCULT BLOOD, ED  TYPE AND SCREEN    Imaging Review No results found. I have personally reviewed and evaluated these images and lab results as part of my medical decision-making.   EKG Interpretation None      MDM   Final diagnoses:  None    Patient has 2 units of packed red blood cells ordered. Will be admitted to the medicine service    Lorre NickAnthony Matilde Markie, MD 05/08/15 [id number]

## 2015-05-09 ENCOUNTER — Ambulatory Visit (HOSPITAL_COMMUNITY): Payer: Medicare HMO

## 2015-05-09 DIAGNOSIS — D649 Anemia, unspecified: Secondary | ICD-10-CM

## 2015-05-09 DIAGNOSIS — E440 Moderate protein-calorie malnutrition: Secondary | ICD-10-CM

## 2015-05-09 DIAGNOSIS — L8990 Pressure ulcer of unspecified site, unspecified stage: Secondary | ICD-10-CM | POA: Insufficient documentation

## 2015-05-09 DIAGNOSIS — E43 Unspecified severe protein-calorie malnutrition: Secondary | ICD-10-CM | POA: Insufficient documentation

## 2015-05-09 LAB — TYPE AND SCREEN
ABO/RH(D): O POS
ANTIBODY SCREEN: NEGATIVE
Unit division: 0
Unit division: 0

## 2015-05-09 LAB — CBC
HCT: 30.4 % — ABNORMAL LOW (ref 39.0–52.0)
Hemoglobin: 9.7 g/dL — ABNORMAL LOW (ref 13.0–17.0)
MCH: 26.4 pg (ref 26.0–34.0)
MCHC: 31.9 g/dL (ref 30.0–36.0)
MCV: 82.8 fL (ref 78.0–100.0)
PLATELETS: 307 10*3/uL (ref 150–400)
RBC: 3.67 MIL/uL — AB (ref 4.22–5.81)
RDW: 16.2 % — ABNORMAL HIGH (ref 11.5–15.5)
WBC: 6.1 10*3/uL (ref 4.0–10.5)

## 2015-05-09 LAB — COMPREHENSIVE METABOLIC PANEL
ALBUMIN: 3.8 g/dL (ref 3.5–5.0)
ALK PHOS: 70 U/L (ref 38–126)
ALT: <5 U/L — ABNORMAL LOW (ref 17–63)
ANION GAP: 8 (ref 5–15)
AST: 31 U/L (ref 15–41)
BILIRUBIN TOTAL: 1 mg/dL (ref 0.3–1.2)
BUN: 84 mg/dL — ABNORMAL HIGH (ref 6–20)
CO2: 32 mmol/L (ref 22–32)
CREATININE: 2.53 mg/dL — AB (ref 0.61–1.24)
Calcium: 9.1 mg/dL (ref 8.9–10.3)
Chloride: 98 mmol/L — ABNORMAL LOW (ref 101–111)
GFR calc Af Amer: 28 mL/min — ABNORMAL LOW (ref >60)
GFR, EST NON AFRICAN AMERICAN: 24 mL/min — AB (ref >60)
GLUCOSE: 106 mg/dL — AB (ref 65–99)
Potassium: 4.7 mmol/L (ref 3.5–5.1)
Sodium: 138 mmol/L (ref 135–145)
Total Protein: 7.5 g/dL (ref 6.5–8.1)

## 2015-05-09 LAB — ABO/RH: ABO/RH(D): O POS

## 2015-05-09 LAB — GLUCOSE, CAPILLARY: Glucose-Capillary: 83 mg/dL (ref 65–99)

## 2015-05-09 MED ORDER — JEVITY 1.5 CAL/FIBER PO LIQD
240.0000 mL | Freq: Every day | ORAL | Status: DC
  Administered 2015-05-09 – 2015-05-13 (×18): 240 mL
  Filled 2015-05-09 (×22): qty 1000

## 2015-05-09 MED ORDER — FREE WATER
100.0000 mL | Freq: Every day | Status: DC
  Administered 2015-05-09 – 2015-05-13 (×19): 100 mL

## 2015-05-09 MED ORDER — JEVITY 1.2 CAL PO LIQD: 1000.0000 mL | ORAL | Status: DC

## 2015-05-09 MED ORDER — IOHEXOL 300 MG/ML  SOLN
25.0000 mL | INTRAMUSCULAR | Status: AC
  Administered 2015-05-09 (×2): 25 mL via ORAL

## 2015-05-09 NOTE — Progress Notes (Signed)
Initial Nutrition Assessment  DOCUMENTATION CODES:   Severe malnutrition in context of acute illness/injury, Underweight  INTERVENTION:  - If diet unable to be re-advanced, recommend 1 can Jevity 1.5 five times/day to meet 100% kcal and protein needs. This regimen would provide 1778 kcal, 75 grams protein, and 900 mL free water. - Will monitor for diet advancement and PO intakes compared to needs to determine if TF regimen needs to be adjusted - RD will continue to monitor for needs   NUTRITION DIAGNOSIS:   Inadequate oral intake related to inability to eat as evidenced by NPO status.  GOAL:   Patient will meet greater than or equal to 90% of their needs  MONITOR:   PO intake, Weight trends, Labs, Skin, I & O's  REASON FOR ASSESSMENT:   Other (Comment) (underweight BMI)  ASSESSMENT:   69 y.o. 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  Pt seen for underweight BMI. Pt is NPO at this time but chart review shows that pt consumed 75% dinner on Heart Healthy diet yesterday (11/8). Pt reports not having anything PO x2 days PTA. He had PEG placed 04/24/15 and states that he takes 4 cans of TF via PEG at home but is unsure of the formula that is used.  He is currently ordered to receive 1 can Jevity 1.5 TID which provides 1066 kcal (62% minimum estimated kcal needs), 45 grams protein (64% minimum estimated protein needs), and 540 mL free water. If PO diet unable to be re-advanced, TF recommendations to meet 100% kcal and protein needs outlined above.  Physical assessment shows moderate fat and severe muscle wasting. Pt reports he gained 18 lbs in the past few months. Per chart review, pt has lost 3 lbs (2.5% body weight) in the past 14 days which is significant for time frame.  Pt unable to meet needs at this time. Medications reviewed. Labs  reviewed; CBGs: 77-159 mg/dL, Cl: 98 mmol/L, BUN/creatinine elevated.   Diet Order:  Diet NPO time specified  Skin:  Wound (see comment) (Stage 1 sacral pressure ulcer)  Last BM:  PTA  Height:   Ht Readings from Last 1 Encounters:  05/08/15 5\' 9"  (1.753 m)    Weight:   Wt Readings from Last 1 Encounters:  05/09/15 117 lb 12.8 oz (53.434 kg)    Ideal Body Weight:  72.73 kg (kg)  BMI:  Body mass index is 17.39 kg/(m^2).  Estimated Nutritional Needs:   Kcal:  1610-96041725-1925  Protein:  70-80 grams  Fluid:  2-2.2 L/day  EDUCATION NEEDS:   No education needs identified at this time     Trenton GammonJessica Jaylise Peek, RD, LDN Inpatient Clinical Dietitian Pager # [phone] After hours/weekend pager # [phone]

## 2015-05-09 NOTE — Consult Note (Signed)
[facility] Gastroenterology Consultation Note  Referring Provider: Dr. Zannie Cove [facility]) Primary Care Physician:  Pcp Not In System  Reason for Consultation:  anemia  HPI: Martin Cain is a 69 y.o. 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  Endoscopy and colonoscopy for these reasons done in [place] one month and one year ago, respectively, reportedly normal, but no records available.  Is having hemoccult positive stools, but no hematemesis, melena, or hematochezia.  Has lost large amount of weight and has progressive dysphagia and failure-to-thrive.  No abdominal pain.   Past Medical History  Diagnosis Date  . Hypertension   . Hyperlipidemia   . CKD (chronic kidney disease)     a. Probable stage III.  Marland Kitchen CAD (coronary artery disease)     a. CABG 2000 in [place]. b. NSTEMI 04/2013 felt due to malignant hypertension (was instructed to f/u [facility] for stress test).  . Gout   . CHF (congestive heart failure) (HCC)     a. Echo 04/2013: EF 45-50%, mild AS, mild biatrial enlargement, sev LVH.  . Osteoarthritis     R knee  . Anemia     Past Surgical History  Procedure Laterality Date  . Cardiac surgery      2000; [place]  . Appendectomy    . Abdominal exploration surgery    . Tonsillectomy      Prior to Admission medications   Medication Sig Start Date End Date Taking? Authorizing Provider  allopurinol (ZYLOPRIM) 100 MG tablet Place 1 tablet (100 mg total) into feeding tube daily. 04/24/15  Yes Maryann Mikhail, DO  amLODipine (NORVASC) 10 MG tablet Place 1 tablet (10 mg total) into feeding tube daily. 04/24/15  Yes Maryann Mikhail, DO  atorvastatin (LIPITOR) 80 MG tablet Place 1 tablet (80 mg total) into feeding tube daily. 04/24/15  Yes Maryann Mikhail, DO  carbidopa-levodopa (SINEMET) 25-100 MG tablet Take 0.5 tablets by mouth 3 (three) times daily. 05/07/15  Yes Donika K Patel, DO   carvedilol (COREG) 25 MG tablet Place 1 tablet (25 mg total) into feeding tube 2 (two) times daily. 04/24/15  Yes Maryann Mikhail, DO  Cholecalciferol (VITAMIN D PO) Place 1,000 mg into feeding tube daily.    Yes Historical Provider, MD  colchicine 0.6 MG tablet Place 0.6 mg into feeding tube daily.    Yes Historical Provider, MD  [facility] ASPIRIN LOW DOSE 81 MG EC tablet Take 81 mg by mouth daily. 04/11/15  Yes Historical Provider, MD  Cyanocobalamin (VITAMIN B-12 PO) Place 1,000 mg into feeding tube daily.    Yes Historical Provider, MD  ferrous sulfate 325 (65 FE) MG tablet Place 325 mg into feeding tube every morning.  04/11/15  Yes Historical Provider, MD  Fluticasone-Salmeterol (ADVAIR) 250-50 MCG/DOSE AEPB Inhale 1 puff into the lungs 2 (two) times daily. 06/09/13  Yes Lars Masson, MD  folic acid (FOLVITE) 1 MG tablet Place 1 mg into feeding tube daily.  04/11/15  Yes Historical Provider, MD  furosemide (LASIX) 40 MG tablet Place 1 tablet (40 mg total) into feeding tube daily. 04/24/15  Yes Maryann Mikhail, DO  hydrALAZINE (APRESOLINE) 100 MG tablet Place 1 tablet (100 mg total) into feeding tube every morning. 04/24/15  Yes Maryann Mikhail, DO  ipratropium-albuterol (DUONEB) 0.5-2.5 (3) MG/3ML SOLN Inhale 3 mLs into the lungs every 6 (six) hours as needed (shortness of breath).  04/11/15  Yes Historical Provider, MD  isosorbide dinitrate (ISORDIL) 40  MG tablet Place 40 mg into feeding tube daily.   Yes Historical Provider, MD  Multiple Vitamin (MULTIVITAMIN WITH MINERALS) TABS tablet Take 1 tablet by mouth daily. Patient taking differently: Place 1 tablet into feeding tube daily.  04/24/15  Yes Maryann Mikhail, DO  nitroGLYCERIN (NITROSTAT) 0.4 MG SL tablet Place 0.4 mg under the tongue every 5 (five) minutes as needed for chest pain.   Yes Historical Provider, MD  Nutritional Supplements (FEEDING SUPPLEMENT, JEVITY 1.5 CAL/FIBER,) LIQD Place 240 mLs into feeding tube 3 (three) times  daily. Patient taking differently: Place 240 mLs into feeding tube 6 (six) times daily.  04/24/15  Yes Maryann Mikhail, DO  pantoprazole sodium (PROTONIX) 40 mg/20 mL PACK Place 40 mg into feeding tube daily. In 30 ml of applesauce.   Yes Historical Provider, MD  QVAR 80 MCG/ACT inhaler Inhale 2 puffs into the lungs 2 (two) times daily. 04/11/15  Yes Historical Provider, MD  risperiDONE (RISPERDAL M-TABS) 1 MG disintegrating tablet Take 1 tablet (1 mg total) by mouth every evening. 05/31/13  Yes Adeline Joselyn Glassman Viyuoh, MD  thiamine 100 MG tablet Take 1 tablet (100 mg total) by mouth daily. Patient taking differently: Place 100 mg into feeding tube daily.  04/24/15  Yes Maryann Mikhail, DO  albuterol (PROVENTIL HFA;VENTOLIN HFA) 108 (90 BASE) MCG/ACT inhaler Inhale 2 puffs into the lungs every 6 (six) hours as needed for wheezing or shortness of breath. Patient not taking: Reported on 05/08/2015 06/28/13   Lars MassonKatarina H Nelson, MD  pantoprazole (PROTONIX) 40 MG tablet Take 1 tablet (40 mg total) by mouth daily. Patient not taking: Reported on 05/08/2015 06/22/13   Ripudeep Jenna LuoK Rai, MD    Current Facility-Administered Medications  Medication Dose Route Frequency Provider Last Rate Last Dose  . allopurinol (ZYLOPRIM) tablet 100 mg  100 mg Per Tube Daily Zannie CovePreetha Joseph, MD   100 mg at 05/09/15 0918  . amLODipine (NORVASC) tablet 10 mg  10 mg Per Tube Daily Zannie CovePreetha Joseph, MD   10 mg at 05/09/15 0919  . atorvastatin (LIPITOR) tablet 80 mg  80 mg Per Tube q1800 Zannie CovePreetha Joseph, MD   80 mg at 05/08/15 2021  . budesonide (PULMICORT) nebulizer solution 0.5 mg  0.5 mg Nebulization BID Zannie CovePreetha Joseph, MD   0.5 mg at 05/09/15 0840  . carbidopa-levodopa (SINEMET IR) 25-100 MG per tablet immediate release 0.5 tablet  0.5 tablet Per Tube TID Zannie CovePreetha Joseph, MD   0.5 tablet at 05/09/15 0919  . carvedilol (COREG) tablet 25 mg  25 mg Per Tube BID WC Zannie CovePreetha Joseph, MD   25 mg at 05/09/15 0918  . colchicine tablet 0.6 mg  0.6 mg  Per Tube Daily Zannie CovePreetha Joseph, MD   0.6 mg at 05/09/15 0918  . feeding supplement (JEVITY 1.5 CAL/FIBER) liquid 240 mL  240 mL Per Tube TID Zannie CovePreetha Joseph, MD   Stopped at 05/09/15 1000  . folic acid (FOLVITE) tablet 1 mg  1 mg Per Tube Daily Zannie CovePreetha Joseph, MD   1 mg at 05/09/15 0919  . ipratropium-albuterol (DUONEB) 0.5-2.5 (3) MG/3ML nebulizer solution 3 mL  3 mL Inhalation Q6H PRN Zannie CovePreetha Joseph, MD      . isosorbide dinitrate (ISORDIL) tablet 40 mg  40 mg Per Tube Daily Zannie CovePreetha Joseph, MD   40 mg at 05/09/15 1200  . mometasone-formoterol (DULERA) 100-5 MCG/ACT inhaler 2 puff  2 puff Inhalation BID Zannie CovePreetha Joseph, MD   2 puff at 05/09/15 0841  . morphine 2 MG/ML injection 1 mg  1 mg Intravenous Q4H PRN Leda Gauze, NP      . multivitamin with minerals tablet 1 tablet  1 tablet Per Tube Daily Zannie Cove, MD   1 tablet at 05/09/15 0919  . nitroGLYCERIN (NITROSTAT) SL tablet 0.4 mg  0.4 mg Sublingual Q5 min PRN Zannie Cove, MD      . ondansetron [facility]) tablet 4 mg  4 mg Oral Q6H PRN Zannie Cove, MD       Or  . ondansetron [facility]) injection 4 mg  4 mg Intravenous Q6H PRN Zannie Cove, MD      . pantoprazole sodium (PROTONIX) 40 mg/20 mL oral suspension 40 mg  40 mg Per Tube Daily Zannie Cove, MD   40 mg at 05/09/15 1159    Allergies as of 05/08/2015  . (No Known Allergies)    Family History  Problem Relation Age of Onset  . Heart disease      No family history  . Cancer Father   . Hypertension Father   . Cancer Brother   . Cancer Sister   . Healthy Daughter     Social History   Social History  . Marital Status: Divorced    Spouse Name: N/A  . Number of Children: 2  . Years of Education: N/A   Occupational History  . Not on file.   Social History Main Topics  . Smoking status: Former Smoker -- 0.50 packs/day for 40 years    Types: Cigarettes  . Smokeless tobacco: Never Used  . Alcohol Use: 0.0 oz/week    0 Standard drinks or equivalent per week      Comment: previously admitted to 1 pint per day. denies use for 1 month.  . Drug Use: No  . Sexual Activity: Not on file   Other Topics Concern  . Not on file   Social History Narrative   Currently lives at [facility].     Retired from being an Lexicographer.    Review of Systems: ROS Dr. Jomarie Longs 05/08/15 reviewed and I agree  Physical Exam: Vital signs in last 24 hours: Temp:  [97.2 F (36.2 C)-98.2 F (36.8 C)] 97.2 F (36.2 C) (11/09 0820) Pulse Rate:  [58-85] 71 (11/09 0820) Resp:  [13-28] 18 (11/09 0820) BP: (126-155)/(53-94) 143/57 mmHg (11/09 0820) SpO2:  [93 %-100 %] 99 % (11/09 0842) Weight:  [52.481 kg (115 lb 11.2 oz)-53.434 kg (117 lb 12.8 oz)] 53.434 kg (117 lb 12.8 oz) (11/09 0401) Last BM Date: 05/08/15 General:   Alert,  Profoundly cachectic, but is in NAD Head:  Normocephalic and atraumatic. Eyes:  Sclera clear, no icterus.   Conjunctiva pink. Ears:  Normal auditory acuity. Nose:  No deformity, discharge,  or lesions. Mouth:  No deformity or lesions.  Oropharynx pink & moist. Neck:  Supple; no masses or thyromegaly. Lungs:  Clear throughout to auscultation.   No wheezes, crackles, or rhonchi. No acute distress. Heart:  Regular rate and rhythm; no murmurs, clicks, rubs,  or gallops. Abdomen:  Soft, scaphoid, old midline surgical scar, left upper quadrant gastrostomy tube; nontender and nondistended. No masses, hepatosplenomegaly or hernias noted. Normal bowel sounds, without guarding, and without rebound.     Msk:  Profound cachexia, supraclavicular wasting, but symmetrical without gross deformities. Normal posture. Pulses:  Normal pulses noted. Extremities:  Without clubbing or edema. Neurologic:  Alert and  oriented x4;  Diffusely weak, some dysarthria Skin:  Intact without significant lesions or rashes. Cervical Nodes:  No significant cervical  adenopathy. Psych:  Alert and cooperative. Normal mood and affect.   Lab  Results:  Recent Labs  05/08/15 1611 05/09/15 0508  WBC 5.0 6.1  HGB 6.4* 9.7*  HCT 20.8* 30.4*  PLT 300 307   BMET  Recent Labs  05/08/15 1611 05/09/15 0508  NA 135 138  K 4.9 4.7  CL 95* 98*  CO2 30 32  GLUCOSE 90 106*  BUN 87* 84*  CREATININE 2.59* 2.53*  CALCIUM 9.0 9.1   LFT  Recent Labs  05/09/15 0508  PROT 7.5  ALBUMIN 3.8  AST 31  ALT <5*  ALKPHOS 70  BILITOT 1.0   PT/INR  Recent Labs  05/08/15 1611  LABPROT 15.3*  INR 1.19    Studies/Results: No results found.  Impression:  1.  Anemia.  Apparently had normal endoscopy and colonoscopy in [place] one month and one year ago, respectively.  No records available. 2.  Hemoccult positive stool.  No overt GI bleeding. 3.  History dysphagia with aspiration, PEG placed last month. 4.  Weight loss. 5.  Severe protein calorie malnutrition.  Plan:  1. Speech therapy evaluation to assess for microaspiration; patient apparently was given regular food tray last night; keep NPO for now, pending Speech Therapy recommendations. 2.  Ok to resume PEG feeds. 3.  CT pelvis (without contrast, given CKD) to complete Gi tract non-invasive evaluation for malignancy. 4.  Advised daughter of importance of obtaining outside endoscopy and colonoscopy records. 5.  Patient is far too weak and deconditioned to consider any type of invasive or interventional GI tract evaluation.  Patient is not appropriate candidate for capsule at this time and, regardless of findings, he is too weak and deconditioned to consider any type of therapy for whatever process might be found. 6.  Will follow.   LOS: 1 day   Trevonn Hallum M  05/09/2015, 12:14 PM  Pager [phone] If no answer or after 5 PM call [phone]

## 2015-05-09 NOTE — Progress Notes (Signed)
SLP Cancellation Note  Patient Details Name: Martin Cain MRN: [id number] DOB: 09/24/1945   Cancelled treatment:       Reason Eval/Treat Not Completed: Other (comment)- NPO for procedure per RN.  Pt known to SLP services from last admission, during which he was assessed for dysphagia.  Has had severe dysphagia with poor management of secretions; PEG and NPO at SNF per pt (excluding ice chips.)    Will f/u next date to determine if MBS may be of value.   Blenda MountsCouture, Gianne Shugars Laurice 05/09/2015, 3:12 PM

## 2015-05-09 NOTE — Progress Notes (Addendum)
NUTRITION NOTE  New consult for TF initiation and management received per GI MD. Notes indicate pt to remain NPO pending SLP evaluation and recommendations.   Please see full nutrition assessment note from today at 1059.  Will order: 1 can Jevity 1.5 five times/day (0600, 1000, 1400, 1800, 2200). This regimen will provide 100% kcal and protein needs: 1778 kcal, 75 grams protein, and 900 mL free water.  Will also order 50 mL free water to be given before and after each bolus; this will add an additional 500cc free water/day.   RD will continue to follow per protocol.  Trenton GammonJessica Reianna Batdorf, RD, LDN Inpatient Clinical Dietitian Pager # [phone] After hours/weekend pager # [phone]

## 2015-05-09 NOTE — Progress Notes (Signed)
[facility] PROGRESS NOTE  Martin Cain [id number]:[id number] DOB: 21-May-1946 DOA: 05/08/2015 PCP: Pcp Not In System  Assessment/Plan: 1. Gi BLEED:  So far outpatient work up did not yield any.  His stool for occult blood is positive.  He received 2 untis and his repeat hemoglobin is 9.7.  Check H&h in am.  Gi consulted and recommended CT pelvis to evaluate further.  Improve his nutritional status.  SLP eval.     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  Plan to obtain iron levels.   Anemia suspect a combination of blood loss anemia from Gi and anemia of chronic disease.  Anemia panel ordered to check iron levels.       Code Status: full code, changed from DNR. Family Communication: daughter at bedside. Updated her Disposition Plan: pending further investigation.    Consultants:  GI  Procedures:  none  Antibiotics:  none  HPI/Subjective: Wants me to start tube feeds assoon as possible.   Objective: Filed Vitals:   05/09/15 1418  BP: 115/51  Pulse: 65  Temp: 97.5 F (36.4 C)  Resp: 18    Intake/Output Summary (Last 24 hours) at 05/09/15 1740 Last data filed at 05/09/15 1720  Gross per 24 hour  Intake 1819.5 ml  Output    700 ml  Net 1119.5 ml   Filed Weights   05/08/15 1835 05/09/15 0401  Weight: 52.481 kg (115 lb 11.2 oz) 53.434 kg (117 lb 12.8 oz)    Exam:   General:  Alert afebrile comfortable  Cardiovascular: s1s2  Respiratory: ctab  Abdomen: soft non tender non distended bowel sounds heard.   Musculoskeletal: no pedal edema.   Data Reviewed: Basic Metabolic Panel:  Recent Labs Lab 05/08/15 1611 05/09/15 0508  NA 135 138  K 4.9 4.7  CL 95* 98*  CO2 30 32  GLUCOSE 90 106*  BUN 87* 84*  CREATININE 2.59* 2.53*  CALCIUM 9.0 9.1   Liver Function Tests:  Recent Labs Lab 05/08/15 1611 05/09/15 0508  AST 30 31  ALT 11* <5*  ALKPHOS 65 70  BILITOT 0.7 1.0  PROT 7.0 7.5  ALBUMIN 3.6 3.8   No results for  input(s): LIPASE, AMYLASE in the last 168 hours. No results for input(s): AMMONIA in the last 168 hours. CBC:  Recent Labs Lab 05/08/15 1611 05/09/15 0508  WBC 5.0 6.1  NEUTROABS 2.8  --   HGB 6.4* 9.7*  HCT 20.8* 30.4*  MCV 80.9 82.8  PLT 300 307   Cardiac Enzymes: No results for input(s): CKTOTAL, CKMB, CKMBINDEX, TROPONINI in the last 168 hours. BNP (last 3 results)  Recent Labs  04/17/15 2216  BNP 2795.6*    ProBNP (last 3 results) No results for input(s): PROBNP in the last 8760 hours.  CBG:  Recent Labs Lab 05/09/15 1654  GLUCAP 83    Recent Results (from the past 240 hour(s))  MRSA PCR Screening     Status: None   Collection Time: 05/08/15  6:42 PM  Result Value Ref Range Status   MRSA by PCR NEGATIVE NEGATIVE Final    Comment:        The GeneXpert MRSA Assay (FDA approved for NASAL specimens only), is one component of a comprehensive MRSA colonization surveillance program. It is not intended to diagnose MRSA infection nor to guide or monitor treatment for MRSA infections.      Studies: Ct Pelvis Wo Contrast  05/09/2015  CLINICAL DATA:  Heme-positive stools. Weight loss, failure to thrive.  EXAM: CT PELVIS WITHOUT CONTRAST TECHNIQUE: Multidetector CT imaging of the pelvis was performed following the standard protocol without intravenous contrast. COMPARISON:  CT abdomen 04/20/2015. FINDINGS: Low-attenuation lesions in the right kidney measure up to approximately 1.6 cm, difficult to characterize without post-contrast imaging. Visualized portions of the small bowel and colon are grossly unremarkable. Difficult to exclude eccentric rectal wall thickening (series 2, image 45). Prostate is normal in size. Bladder is grossly unremarkable. Atherosclerotic calcification of the arterial vasculature. Infrarenal aorta measures up to 2.9 cm. No free fluid. No definite pathologically enlarged lymph nodes. No worrisome lytic or sclerotic lesions. Remote sacral and  left superior pubic rami fractures. IMPRESSION: 1. Difficult to exclude eccentric rectal wall thickening. No additional findings to explain the patient's history. 2. Mildly ectatic infrarenal aorta. Electronically Signed   By: Leanna BattlesMelinda  Blietz M.D.   On: 05/09/2015 16:30    Scheduled Meds: . allopurinol  100 mg Per Tube Daily  . amLODipine  10 mg Per Tube Daily  . atorvastatin  80 mg Per Tube q1800  . budesonide (PULMICORT) nebulizer solution  0.5 mg Nebulization BID  . carbidopa-levodopa  0.5 tablet Per Tube TID  . carvedilol  25 mg Per Tube BID WC  . colchicine  0.6 mg Per Tube Daily  . feeding supplement (JEVITY 1.5 CAL/FIBER)  240 mL Per Tube 5 X Daily  . folic acid  1 mg Per Tube Daily  . free water  100 mL Per Tube 5 X Daily  . isosorbide dinitrate  40 mg Per Tube Daily  . mometasone-formoterol  2 puff Inhalation BID  . multivitamin with minerals  1 tablet Per Tube Daily  . pantoprazole sodium  40 mg Per Tube Daily   Continuous Infusions:   Active Problems:   Malnutrition of moderate degree (HCC)   Symptomatic anemia   Dysphagia   Anemia   Pressure ulcer   Protein-calorie malnutrition, severe    Time spent: 30 MIN    Detravion Tester  [facility] Pager [phone] If 7PM-7AM, please contact night-coverage at [email or web address], password [email or web address] 05/09/2015, 5:40 PM  LOS: 1 day

## 2015-05-09 NOTE — Progress Notes (Signed)
PT Cancellation Note  Patient Details Name: Martin Cain MRN: [id number] DOB: 12/16/1945   Cancelled Treatment:    Reason Eval/Treat Not Completed: Fatigue/lethargy limiting ability to participate Pt reports he feels too weak and hungry to participate; states he has no energy.  Pt would like to eat prior to working with PT.   Maida SaleLEMYRE,KATHrine E 05/09/2015, 1:52 PM Zenovia JarredKati Notnamed Scholz, PT, DPT 05/09/2015 Pager: [phone]

## 2015-05-10 ENCOUNTER — Other Ambulatory Visit (HOSPITAL_COMMUNITY): Payer: Medicare HMO

## 2015-05-10 DIAGNOSIS — R1310 Dysphagia, unspecified: Secondary | ICD-10-CM

## 2015-05-10 LAB — IRON AND TIBC
Iron: 25 ug/dL — ABNORMAL LOW (ref 45–182)
SATURATION RATIOS: 7 % — AB (ref 17.9–39.5)
TIBC: 346 ug/dL (ref 250–450)
UIBC: 321 ug/dL

## 2015-05-10 LAB — CBC
HCT: 28 % — ABNORMAL LOW (ref 39.0–52.0)
Hemoglobin: 9 g/dL — ABNORMAL LOW (ref 13.0–17.0)
MCH: 27 pg (ref 26.0–34.0)
MCHC: 32.1 g/dL (ref 30.0–36.0)
MCV: 84.1 fL (ref 78.0–100.0)
PLATELETS: 309 10*3/uL (ref 150–400)
RBC: 3.33 MIL/uL — AB (ref 4.22–5.81)
RDW: 16.7 % — AB (ref 11.5–15.5)
WBC: 6.1 10*3/uL (ref 4.0–10.5)

## 2015-05-10 LAB — VITAMIN B12: VITAMIN B 12: 937 pg/mL — AB (ref 180–914)

## 2015-05-10 LAB — RETICULOCYTES
RBC.: 3.42 MIL/uL — AB (ref 4.22–5.81)
RETIC CT PCT: 2.1 % (ref 0.4–3.1)
Retic Count, Absolute: 71.8 10*3/uL (ref 19.0–186.0)

## 2015-05-10 LAB — FOLATE: Folate: 56.7 ng/mL (ref >5.9)

## 2015-05-10 LAB — FERRITIN: FERRITIN: 26 ng/mL (ref 24–336)

## 2015-05-10 NOTE — Progress Notes (Signed)
[facility] PROGRESS NOTE  [name] [id number] DOB: 09/13/1945 DOA: 05/08/2015 PCP: Pcp Not In System  Assessment/Plan: 1. Gi BLEED:  His stool for occult blood is positive on admission.  He received 2 untis and his repeat hemoglobin is stable around 9.  [facility]. Martin Cain.  Gi consulted and recommended CT pelvis  Showed rectal wall thickening. Plan for flex sigmoidoscopy.  Improve his nutritional status.  SLP eval.     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  Low iron levels.   Anemia suspect a combination of blood loss anemia from Gi and anemia of chronic disease.  Anemia panel shows low iron levels. Low saturation ratios, and ferritin of 26.        Code Status: full code, changed from DNR. Family Communication: none at bedside.  Disposition Plan: pending further investigation.    Consultants:  GI  Procedures:  none  Antibiotics:  none  HPI/Subjective: Reports feeling better today.   Objective: Filed Vitals:   05/10/15 0913  BP: 146/59  Pulse: 71  Temp: 97.4 F (36.3 C)  Resp: 18    Intake/Output Summary (Last 24 hours) at 05/10/15 1800 Last data filed at 05/10/15 1202  Gross per 24 hour  Intake    990 ml  Output      0 ml  Net    990 ml   Filed Weights   05/08/15 1835 05/09/15 0401 05/10/15 0500  Weight: 52.481 kg (115 lb 11.2 oz) 53.434 kg (117 lb 12.8 oz) 57.063 kg (125 lb 12.8 oz)    Exam:   General:  Alert afebrile comfortable  Cardiovascular: s1s2  Respiratory: ctab  Abdomen: soft non tender non distended bowel sounds heard.   Musculoskeletal: no pedal edema.   Data Reviewed: Basic Metabolic Panel:  Recent Labs Lab 05/08/15 1611 05/09/15 0508  NA 135 138  K 4.9 4.7  CL 95* 98*  CO2 30 32  GLUCOSE 90 106*  BUN 87* 84*  CREATININE 2.59* 2.53*  CALCIUM 9.0 9.1   Liver Function Tests:  Recent Labs Lab 05/08/15 1611 05/09/15 0508  AST 30 31  ALT 11* <5*   ALKPHOS 65 70  BILITOT 0.7 1.0  PROT 7.0 7.5  ALBUMIN 3.6 3.8   No results for input(s): LIPASE, AMYLASE in the last 168 hours. No results for input(s): AMMONIA in the last 168 hours. CBC:  Recent Labs Lab 05/08/15 1611 05/09/15 0508 05/10/15 0523  WBC 5.0 6.1 6.1  NEUTROABS 2.8  --   --   HGB 6.4* 9.7* 9.0*  HCT 20.8* 30.4* 28.0*  MCV 80.9 82.8 84.1  PLT 300 307 309   Cardiac Enzymes: No results for input(s): CKTOTAL, CKMB, CKMBINDEX, TROPONINI in the last 168 hours. BNP (last 3 results)  Recent Labs  04/17/15 2216  BNP 2795.6*    ProBNP (last 3 results) No results for input(s): PROBNP in the last 8760 hours.  CBG:  Recent Labs Lab 05/09/15 1654  GLUCAP 83    Recent Results (from the past 240 hour(s))  MRSA PCR Screening     Status: None   Collection Time: 05/08/15  6:42 PM  Result Value Ref Range Status   MRSA by PCR NEGATIVE NEGATIVE Final    Comment:        The GeneXpert MRSA Assay (FDA approved for NASAL specimens only), is one component of a comprehensive MRSA colonization surveillance program. It is not intended to diagnose MRSA infection nor to  guide or monitor treatment for MRSA infections.      Studies: Ct Pelvis Wo Contrast  05/09/2015  CLINICAL DATA:  Heme-positive stools. Weight loss, failure to thrive. EXAM: CT PELVIS WITHOUT CONTRAST TECHNIQUE: Multidetector CT imaging of the pelvis was performed following the standard protocol without intravenous contrast. COMPARISON:  CT abdomen 04/20/2015. FINDINGS: Low-attenuation lesions in the right kidney measure up to approximately 1.6 cm, difficult to characterize without post-contrast imaging. Visualized portions of the small bowel and colon are grossly unremarkable. Difficult to exclude eccentric rectal wall thickening (series 2, image 45). Prostate is normal in size. Bladder is grossly unremarkable. Atherosclerotic calcification of the arterial vasculature. Infrarenal aorta measures up to 2.9  cm. No free fluid. No definite pathologically enlarged lymph nodes. No worrisome lytic or sclerotic lesions. Remote sacral and left superior pubic rami fractures. IMPRESSION: 1. Difficult to exclude eccentric rectal wall thickening. No additional findings to explain the patient's history. 2. Mildly ectatic infrarenal aorta. Electronically Signed   By: Leanna Battles M.D.   On: 05/09/2015 16:30    Scheduled Meds: . allopurinol  100 mg Per Tube Daily  . amLODipine  10 mg Per Tube Daily  . atorvastatin  80 mg Per Tube q1800  . budesonide (PULMICORT) nebulizer solution  0.5 mg Nebulization BID  . carbidopa-levodopa  0.5 tablet Per Tube TID  . carvedilol  25 mg Per Tube BID WC  . colchicine  0.6 mg Per Tube Daily  . feeding supplement (JEVITY 1.5 CAL/FIBER)  240 mL Per Tube 5 X Daily  . folic acid  1 mg Per Tube Daily  . free water  100 mL Per Tube 5 X Daily  . isosorbide dinitrate  40 mg Per Tube Daily  . mometasone-formoterol  2 puff Inhalation BID  . multivitamin with minerals  1 tablet Per Tube Daily  . pantoprazole sodium  40 mg Per Tube Daily   Continuous Infusions:   Active Problems:   Malnutrition of moderate degree (HCC)   Symptomatic anemia   Dysphagia   Anemia   Pressure ulcer   Protein-calorie malnutrition, severe    Time spent: 025 [facility]  [facility] Pager [phone] If 7PM-7AM, please contact night-coverage at [email or web address], password [facility] 05/10/2015, 6:00 PM  LOS: 2 days

## 2015-05-10 NOTE — Clinical Social Work Note (Signed)
Clinical Social Work Assessment  Patient Details  Name: Martin Cain MRN: [id number] Date of Birth: October 01, 1945  Date of referral:  05/10/15               Reason for consult:  Facility Placement                Permission sought to share information with:  Facility Art therapist granted to share information::  Yes, Verbal Permission Granted  Name::        Agency::  [facility]  Relationship::     Contact Information:     Housing/Transportation Living arrangements for the past 2 months:  Das[place] of Information:  Patient Patient Interpreter Needed:  None Criminal Activity/Legal Involvement Pertinent to Current Situation/Hospitalization:  No - Comment as needed Significant Relationships:  Adult Children, Significant Other Lives with:  Facility Resident Do you feel safe going back to the place where you live?  Yes Need for family participation in patient care:  No (Coment)  Care giving concerns: Pt readmitted to hospital within the last 30 days. Pt admitted from [facility].   Social Worker assessment / plan:  Pt was admitted from [facility].  BSW Intern met with pt at bedside. Pt agreeable to dc to [facility] when medically ready. Pt discussed moving from [place] to [place] for 24 hour care with his daughter. Pt pleased to be back in [place] where he is originally from. Pt stated he has been at the facility for about a month.   CSW contacted [facility] and they can accept pt back and will began insurance authorization through [facility].   CSW to continue to follow and provide pt with support.  Employment status:  Retired Nurse, adult PT Recommendations:  Fl[place]ick / Referral to community resources:  [place]  Patient/Family's Response to care:  Pt alert and oriented x4. Pt expressed no complaints about [facility]. Pt is pleasant and engaged in  conversation.    Patient/Family's Understanding of and Emotional Response to Diagnosis, Current Treatment, and Prognosis:  Pt is understanding and accepting of dc plan.   Emotional Assessment Appearance:  Appears stated age Attitude/Demeanor/Rapport:   (Appropriate) Affect (typically observed):  Pleasant, Appropriate Orientation:  Oriented to Self, Oriented to Place, Oriented to  Time, Oriented to Situation Alcohol / Substance use:  Not Applicable Psych involvement (Current and /or in the community):  No (Comment)  Discharge Needs  Concerns to be addressed:  Discharge Planning Concerns Readmission within the last 30 days:  Yes Current discharge risk:  None Barriers to Discharge:  Continued Medical Work up   [employer], Student-SW 05/10/2015, 2:23 PM

## 2015-05-10 NOTE — NC FL2 (Signed)
[address] MEDICAID FL2 LEVEL OF CARE SCREENING TOOL     IDENTIFICATION  Patient Name: Martin Cain Birthdate: May 28, 1946 Sex: male Admission Date (Current Location): 05/08/2015  [facility] and [place] Number: Victoriano Lain   Facility and Address:  [facility],  [address] [place] [address]      Provider Number: [id number]  Attending Physician Name and Address:  Kathlen Mody, MD  Relative Name and Phone Number:       Current Level of Care: Hospital Recommended Level of Care: Skilled Nursing Facility Prior Approval Number:    Date Approved/Denied:   PASRR Number: [id number]  Discharge Plan: SNF    Current Diagnoses: Patient Active Problem List   Diagnosis Date Noted  . Pressure ulcer 05/09/2015  . Protein-calorie malnutrition, severe 05/09/2015  . Anemia 05/08/2015  . Absolute anemia   . Dysphagia   . Palliative care encounter   . Symptomatic anemia 04/17/2015  . Malnutrition of moderate degree (HCC) 06/21/2013  . HTN (hypertension) 06/20/2013  . Acute on chronic combined systolic and diastolic heart failure (HCC) 06/20/2013  . Acute on chronic renal failure (HCC) 05/30/2013  . Acute on chronic combined systolic and diastolic CHF (congestive heart failure) (HCC) 05/28/2013  . Renal insufficiency 05/27/2013  . Acute exacerbation of CHF (congestive heart failure) (HCC) 05/27/2013  . Elevated troponin 05/27/2013  . Gout flare 05/27/2013  . HTN (hypertension), malignant 05/27/2013    Orientation ACTIVITIES/SOCIAL BLADDER RESPIRATION    Self, Time, Situation, Place  Active Continent Normal  BEHAVIORAL SYMPTOMS/MOOD NEUROLOGICAL BOWEL NUTRITION STATUS      Continent Feeding tube  PHYSICIAN VISITS COMMUNICATION OF NEEDS Height & Weight Skin    Verbally  (175.3 cm) 125 lbs. PU Stage and Appropriate Care PU Stage 1 Dressing: Daily        AMBULATORY STATUS RESPIRATION    Assist extensive Normal      Personal Care Assistance Level of Assistance   Bathing, Dressing, Feeding Bathing Assistance: Limited assistance Feeding assistance: Independent Dressing Assistance: Limited assistance      Functional Limitations Info  Sight Sight Info: Impaired           SPECIAL CARE FACTORS FREQUENCY  PT (By licensed PT)     PT Frequency: 5x/week             Additional Factors Info  Code Status, Allergies Code Status Info: FULL Allergies Info: No Known Allergies            Current Medications (05/10/2015): Current Facility-Administered Medications  Medication Dose Route Frequency Provider Last Rate Last Dose  . allopurinol (ZYLOPRIM) tablet 100 mg  100 mg Per Tube Daily Zannie Cove, MD   100 mg at 05/10/15 1010  . amLODipine (NORVASC) tablet 10 mg  10 mg Per Tube Daily Zannie Cove, MD   10 mg at 05/10/15 1152  . atorvastatin (LIPITOR) tablet 80 mg  80 mg Per Tube q1800 Zannie Cove, MD   80 mg at 05/09/15 1718  . budesonide (PULMICORT) nebulizer solution 0.5 mg  0.5 mg Nebulization BID Zannie Cove, MD   0.5 mg at 05/10/15 0831  . carbidopa-levodopa (SINEMET IR) 25-100 MG per tablet immediate release 0.5 tablet  0.5 tablet Per Tube TID Zannie Cove, MD   0.5 tablet at 05/10/15 1010  . carvedilol (COREG) tablet 25 mg  25 mg Per Tube BID WC Zannie Cove, MD   25 mg at 05/10/15 1010  . colchicine tablet 0.6 mg  0.6 mg Per Tube Daily Zannie Cove, MD  0.6 mg at 05/10/15 1010  . feeding supplement (JEVITY 1.5 CAL/FIBER) liquid 240 mL  240 mL Per Tube 5 X Daily Anderson [place], RD   240 mL at 05/10/15 1033  . folic acid (FOLVITE) tablet 1 mg  1 mg Per Tube Daily Zannie Cove, MD   1 mg at 05/10/15 1010  . free water 100 mL  100 mL Per Tube 5 X Daily Anderson Malta [place], RD   100 mL at 05/10/15 1033  . ipratropium-albuterol (DUONEB) 0.5-2.5 (3) MG/3ML nebulizer solution 3 mL  3 mL Inhalation Q6H PRN Zannie Cove, MD      . isosorbide dinitrate (ISORDIL) tablet 40 mg  40 mg Per Tube Daily Zannie Cove, MD   40 mg  at 05/10/15 1152  . mometasone-formoterol (DULERA) 100-5 MCG/ACT inhaler 2 puff  2 puff Inhalation BID Zannie Cove, MD   2 puff at 05/10/15 0831  . morphine 2 MG/ML injection 1 mg  1 mg Intravenous Q4H PRN Leda Gauze, NP      . multivitamin with minerals tablet 1 tablet  1 tablet Per Tube Daily Zannie Cove, MD   1 tablet at 05/10/15 1010  . nitroGLYCERIN (NITROSTAT) SL tablet 0.4 mg  0.4 mg Sublingual Q5 min PRN Zannie Cove, MD      . ondansetron [facility]) tablet 4 mg  4 mg Oral Q6H PRN Zannie Cove, MD       Or  . ondansetron [facility]) injection 4 mg  4 mg Intravenous Q6H PRN Zannie Cove, MD      . pantoprazole sodium (PROTONIX) 40 mg/20 mL oral suspension 40 mg  40 mg Per Tube Daily Zannie Cove, MD   40 mg at 05/10/15 1010   Do not use this list as official medication orders. Please verify with discharge summary.  Discharge Medications:   Medication List    ASK your doctor about these medications        albuterol 108 (90 BASE) MCG/ACT inhaler  Commonly known as:  PROVENTIL HFA;VENTOLIN HFA  Inhale 2 puffs into the lungs every 6 (six) hours as needed for wheezing or shortness of breath.     allopurinol 100 MG tablet  Commonly known as:  ZYLOPRIM  Place 1 tablet (100 mg total) into feeding tube daily.     amLODipine 10 MG tablet  Commonly known as:  NORVASC  Place 1 tablet (10 mg total) into feeding tube daily.     atorvastatin 80 MG tablet  Commonly known as:  LIPITOR  Place 1 tablet (80 mg total) into feeding tube daily.     carbidopa-levodopa 25-100 MG tablet  Commonly known as:  SINEMET  Take 0.5 tablets by mouth 3 (three) times daily.     carvedilol 25 MG tablet  Commonly known as:  COREG  Place 1 tablet (25 mg total) into feeding tube 2 (two) times daily.     colchicine 0.6 MG tablet  Place 0.6 mg into feeding tube daily.     [employer] ASPIRIN LOW DOSE 81 MG EC tablet  Generic drug:  aspirin  Take 81 mg by mouth daily.     feeding supplement  (JEVITY 1.5 CAL/FIBER) Liqd  Place 240 mLs into feeding tube 3 (three) times daily.     ferrous sulfate 325 (65 FE) MG tablet  Place 325 mg into feeding tube every morning.     Fluticasone-Salmeterol 250-50 MCG/DOSE Aepb  Commonly known as:  ADVAIR  Inhale 1 puff into the lungs 2 (two) times  daily.     folic acid 1 MG tablet  Commonly known as:  FOLVITE  Place 1 mg into feeding tube daily.     furosemide 40 MG tablet  Commonly known as:  LASIX  Place 1 tablet (40 mg total) into feeding tube daily.     hydrALAZINE 100 MG tablet  Commonly known as:  APRESOLINE  Place 1 tablet (100 mg total) into feeding tube every morning.     ipratropium-albuterol 0.5-2.5 (3) MG/3ML Soln  Commonly known as:  DUONEB  Inhale 3 mLs into the lungs every 6 (six) hours as needed (shortness of breath).     isosorbide dinitrate 40 MG tablet  Commonly known as:  ISORDIL  Place 40 mg into feeding tube daily.     multivitamin with minerals Tabs tablet  Take 1 tablet by mouth daily.     nitroGLYCERIN 0.4 MG SL tablet  Commonly known as:  NITROSTAT  Place 0.4 mg under the tongue every 5 (five) minutes as needed for chest pain.     PROTONIX 40 mg/20 mL Pack  Generic drug:  pantoprazole sodium  Place 40 mg into feeding tube daily. In 30 ml of applesauce.     pantoprazole 40 MG tablet  Commonly known as:  PROTONIX  Take 1 tablet (40 mg total) by mouth daily.     QVAR 80 MCG/ACT inhaler  Generic drug:  beclomethasone  Inhale 2 puffs into the lungs 2 (two) times daily.     risperiDONE 1 MG disintegrating tablet  Commonly known as:  RISPERDAL M-TABS  Take 1 tablet (1 mg total) by mouth every evening.     thiamine 100 MG tablet  Take 1 tablet (100 mg total) by mouth daily.     VITAMIN B-12 PO  Place 1,000 mg into feeding tube daily.     VITAMIN D PO  Place 1,000 mg into feeding tube daily.        Relevant Imaging Results:  Relevant Lab Results:  Recent Labs    Additional  Information SSN: [id number]  KIDD, SUZANNA A, LCSW

## 2015-05-10 NOTE — Evaluation (Signed)
Physical Therapy Evaluation Patient Details Name: Martin Cain MRN: [id number] DOB: 1946/05/04 Today's Date: 05/10/2015   History of Present Illness  69 yo male admitted with anemia and severe malnutrition. PMH of HTN, HLD, CKD stage 4, CAD, gout, CHF, dysphagia, and ongoing alcohol abuse. 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  Clinical Impression  Pt admitted with above diagnosis. Pt currently with functional limitations due to the deficits listed below (see PT Problem List). Pt will benefit from skilled PT to increase their independence and safety with mobility to allow discharge to the venue listed below. Didn't ambulate today, pt only willing to transfer to recliner d/t fatigue; transfer to recliner with min assist from therapist. Recommend continuous rehab at SNF at d/c.        Follow Up Recommendations SNF;Supervision for mobility/OOB    Equipment Recommendations  None recommended by PT    Recommendations for Other Services       Precautions / Restrictions Precautions Precautions: Fall Restrictions Weight Bearing Restrictions: No      Mobility  Bed Mobility Overal bed mobility: Needs Assistance Bed Mobility: Supine to Sit     Supine to sit: Min assist     General bed mobility comments: Increased time, min assist with scooting to edge of bed, min VC's for self-assist with trunk elevation   Transfers Overall transfer level: Needs assistance Equipment used: 1 person hand held assist Transfers: Stand Pivot Transfers;Sit to/from Stand Sit to Stand: Min assist Stand pivot transfers: Min assist       General transfer comment: Steady assist with rise and maintaining erect posture, multiple multi-modal cues for taking steps and turning towards recliner, increased time, min guard assist with descent  Ambulation/Gait             General Gait Details: Pt declined amb at this time   Stairs            Wheelchair Mobility    Modified  Rankin (Stroke Patients Only)       Balance Overall balance assessment: Needs assistance Sitting-balance support: Bilateral upper extremity supported Sitting balance-Leahy Scale: Poor Sitting balance - Comments: Unable to sit without support (UEs support or recliner), requires multiple cues and increased time for scooting and using B UEs   Standing balance support: Bilateral upper extremity supported Standing balance-Leahy Scale: Poor Standing balance comment: Pt required assist from therapist to maintain standing balance and to safely transfer to the recliner                              Pertinent Vitals/Pain Pain Assessment: No/denies pain    Home Living Family/patient expects to be discharged to:: Skilled nursing facility                      Prior Function Level of Independence: Needs assistance         Comments: family member is not present to clarify PLOF; pt states he was walking in rehab using RW      Hand Dominance        Extremity/Trunk Assessment   Upper Extremity Assessment: Overall WFL for tasks assessed           Lower Extremity Assessment: Overall WFL for tasks assessed (strength at least 4/5 B LEs, pt presents with decreased motor control )      Cervical / Trunk Assessment: Normal  Communication   Communication: Expressive difficulties (low tone, raspy voice, coughing)  Cognition Arousal/Alertness: Awake/alert Behavior During Therapy: WFL for tasks assessed/performed Overall Cognitive Status: No family/caregiver present to determine baseline cognitive functioning                      General Comments      Exercises        Assessment/Plan    PT Assessment Patient needs continued PT services  PT Diagnosis Difficulty walking   PT Problem List Decreased activity tolerance;Decreased balance;Decreased mobility;Decreased knowledge of precautions;Decreased coordination;Decreased safety awareness;Decreased  knowledge of use of DME  PT Treatment Interventions DME instruction;Gait training;Functional mobility training;Therapeutic activities;Therapeutic exercise;Balance training;Patient/family education   PT Goals (Current goals can be found in the Care Plan section) Acute Rehab PT Goals Patient Stated Goal: pt wants to discharge today  PT Goal Formulation: With patient Time For Goal Achievement: 05/17/15 Potential to Achieve Goals: Good    Frequency Min 3X/week   Barriers to discharge        Co-evaluation               End of Session Equipment Utilized During Treatment: Gait belt Activity Tolerance: Patient limited by fatigue Patient left: in chair;with call bell/phone within reach;with chair alarm set Nurse Communication: Mobility status         Time: 1051-1107 PT Time Calculation (min) (ACUTE ONLY): 16 min   Charges:   PT Evaluation $Initial PT Evaluation Tier I: 1 Procedure     PT G Codes:        Dinorah Masullo, SPT 05/10/2015, 12:34 PM

## 2015-05-10 NOTE — Evaluation (Signed)
Clinical/Bedside Swallow Evaluation Patient Details  Name: Martin Cain MRN: [id number] Date of Birth: 11-Dec-1945  Today's Date: 05/10/2015 Time: SLP Start Time (ACUTE ONLY): 1214 SLP Stop Time (ACUTE ONLY): 1301 SLP Time Calculation (min) (ACUTE ONLY): 47 min  Past Medical History:  Past Medical History  Diagnosis Date  . Hypertension   . Hyperlipidemia   . CKD (chronic kidney disease)     a. Probable stage III.  Marland Kitchen CAD (coronary artery disease)     a. CABG 2000 in [place]. b. NSTEMI 04/2013 felt due to malignant hypertension (was instructed to f/u ATL for stress test).  . Gout   . CHF (congestive heart failure) (HCC)     a. Echo 04/2013: EF 45-50%, mild AS, mild biatrial enlargement, sev LVH.  . Osteoarthritis     R knee  . Anemia    Past Surgical History:  Past Surgical History  Procedure Laterality Date  . Cardiac surgery      2000; [place]  . Appendectomy    . Abdominal exploration surgery    . Tonsillectomy     HPI:  69 yo male adm to [facility] with anemia.  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  Pt is s/p endoscopy and colonoscopy= plan for sigmoidoscopy tomorrow per GI.  Swallow evaluation ordered.    Assessment / Plan / Recommendation Clinical Impression  Pt continutes to present with symptoms of mild oral and severe pharyngeal dysphagia.  Single ice chips given only due to level of dysphagia noted on prior FEES study.  Decreased laryngeal elevation with multiple swallows and wet voice/delayed cough noted with single ice chips.   Pt instructed to "hock" and use suction to clear pharyngeal secretions initially requiring max visual/verbal cues - fading to min at end of session.  Further implored pt to cough/clear this throat if voice is gurgly due to suspected secretion aspiration.  Suspect pt continues with severe deficits, however he is motivated, reports improved swallow since last admit and desires to eat.    Recommend pt be  allowed ice chips only with strict precautions to decrease disuse muscle atrophy and undergo MBS 05/11/15 pm *as pt scheduled for GI procedure in am.   Using teach back and written instructions for pt, educated him to ice chips consumption strategies.  SLP spoke to Dr Dulce Sellar to relay information and he agreed.      Aspiration Risk  Severe aspiration risk    Diet Recommendation   npo except single ice chips   Medication Administration: Via alternative means    Other  Recommendations Oral Care Recommendations: Oral care QID Other Recommendations: Have oral suction available   Follow up Recommendations  Skilled Nursing facility    Frequency and Duration min 2x/week  2 weeks       Swallow Study   General HPI: 69 yo male adm to [facility] with anemia.  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  Pt is s/p endoscopy and colonoscopy= plan for sigmoidoscopy tomorrow per GI.  Swallow evaluation ordered.  Diet Prior to this Study: NPO Temperature Spikes Noted: No Respiratory Status: Room air History of Recent Intubation: No Behavior/Cognition: Alert;Cooperative;Pleasant mood Oral Cavity Assessment: Within Functional Limits Oral Care Completed by SLP: Other (Comment) (recently completed by pt - brushed his teeth this am) Oral Cavity - Dentition: Adequate natural dentition Vision: Functional for self-feeding Self-Feeding Abilities: Able to feed self Patient Positioning: Upright in chair Baseline Vocal Quality: Low vocal intensity;Suspected  CN X (Vagus) involvement;Wet;Breathy Volitional Cough: Weak Volitional Swallow: Able to elicit (with significant effort)    Oral/Motor/Sensory Function Facial ROM: Within Functional Limits Facial Symmetry: Within Functional Limits Facial Strength: Within Functional Limits Lingual ROM: Suspected CN XII (hypoglossal) dysfunction Lingual Symmetry: Within Functional Limits Lingual Strength: Reduced;Suspected CN XII  (hypoglossal) dysfunction Velum:  (sluggish palatal elevation) Mandible: Within Functional Limits   Ice Chips Ice chips: Impaired Presentation: Spoon Pharyngeal Phase Impairments: Decreased hyoid-laryngeal movement;Multiple swallows;Throat Clearing - Immediate;Cough - Delayed   Thin Liquid Thin Liquid: Not tested    Nectar Thick Nectar Thick Liquid: Not tested   Honey Thick Honey Thick Liquid: Not tested   Puree Puree: Not tested   Solid Solid: Not tested       Martin Burnetamara Jaaron Oleson, MS [facility] SLP [phone]

## 2015-05-10 NOTE — Progress Notes (Signed)
Subjective: No complaints.  Objective: Vital signs in last 24 hours: Temp:  [97.4 F (36.3 C)-98.1 F (36.7 C)] 97.4 F (36.3 C) (11/10 0913) Pulse Rate:  [65-71] 71 (11/10 0913) Resp:  [18] 18 (11/10 0913) BP: (115-146)/(51-63) 146/59 mmHg (11/10 0913) SpO2:  [99 %-100 %] 100 % (11/10 0913) Weight:  [57.063 kg (125 lb 12.8 oz)] 57.063 kg (125 lb 12.8 oz) (11/10 0500) Weight change: 4.581 kg (10 lb 1.6 oz) Last BM Date: 05/09/15  PE: GEN:  Thin and cachectic-appearing, but is in no acute distress  Lab Results: CBC    Component Value Date/Time   WBC 6.1 05/10/2015 0523   RBC 3.42* 05/10/2015 0523   RBC 3.33* 05/10/2015 0523   HGB 9.0* 05/10/2015 0523   HCT 28.0* 05/10/2015 0523   PLT 309 05/10/2015 0523   MCV 84.1 05/10/2015 0523   MCH 27.0 05/10/2015 0523   MCHC 32.1 05/10/2015 0523   RDW 16.7* 05/10/2015 0523   LYMPHSABS 1.8 05/08/2015 1611   MONOABS 0.3 05/08/2015 1611   EOSABS 0.1 05/08/2015 1611   BASOSABS 0.0 05/08/2015 1611   CMP     Component Value Date/Time   NA 138 05/09/2015 0508   K 4.7 05/09/2015 0508   CL 98* 05/09/2015 0508   CO2 32 05/09/2015 0508   GLUCOSE 106* 05/09/2015 0508   BUN 84* 05/09/2015 0508   CREATININE 2.53* 05/09/2015 0508   CREATININE 2.52* 04/17/2015 0928   CALCIUM 9.1 05/09/2015 0508   PROT 7.5 05/09/2015 0508   ALBUMIN 3.8 05/09/2015 0508   AST 31 05/09/2015 0508   ALT <5* 05/09/2015 0508   ALKPHOS 70 05/09/2015 0508   BILITOT 1.0 05/09/2015 0508   GFRNONAA 24* 05/09/2015 0508   GFRAA 28* 05/09/2015 0508   Studies/Results: CT pelvis (without contrast): Possible asymmetrical rectal thickening  Assessment:  1.  Anemia and hemoccult positive stools.  No overt GI bleeding.  Endoscopy this year and colonoscopy last year at [facility], awaiting records. 2.  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 3.  Rectal thickening on CT scan,  Unclear etiology or significance.  Plan:  1.  Plan  for flexible sigmoidoscopy tomorrow, tentatively 1130 am, to assess rectal wall thickening.   2.  Really need outside endoscopy and colonoscopy records. 3.  Would like repeat Speech Therapy consultation prior to discharge, to see if he is going to be able to eat anything by mouth. 4.  Stop PEG feeds after midnight. 5.  Discussed case with patient's daughter over the phone, and she's in agreement with our plan. 6.  Will follow.   Tabetha Haraway M 05/10/2015, 12:07 PM   Pager [phone] If no answer or after 5 PM call [phone]  

## 2015-05-11 ENCOUNTER — Encounter (HOSPITAL_COMMUNITY)
Admission: EM | Disposition: A | Discharge status: Skilled Nursing Facility | Payer: Self-pay | Source: Home / Self Care | Attending: Internal Medicine

## 2015-05-11 ENCOUNTER — Inpatient Hospital Stay (HOSPITAL_COMMUNITY): Payer: Medicare HMO

## 2015-05-11 ENCOUNTER — Encounter (HOSPITAL_COMMUNITY): Payer: Self-pay | Admitting: *Deleted

## 2015-05-11 HISTORY — PX: FLEXIBLE SIGMOIDOSCOPY: SHX5431

## 2015-05-11 LAB — GLUCOSE, CAPILLARY: Glucose-Capillary: 107 mg/dL — ABNORMAL HIGH (ref 65–99)

## 2015-05-11 LAB — BASIC METABOLIC PANEL
ANION GAP: 6 (ref 5–15)
BUN: 71 mg/dL — ABNORMAL HIGH (ref 6–20)
CHLORIDE: 101 mmol/L (ref 101–111)
CO2: 30 mmol/L (ref 22–32)
Calcium: 8.9 mg/dL (ref 8.9–10.3)
Creatinine, Ser: 2.16 mg/dL — ABNORMAL HIGH (ref 0.61–1.24)
GFR calc Af Amer: 34 mL/min — ABNORMAL LOW (ref >60)
GFR, EST NON AFRICAN AMERICAN: 29 mL/min — AB (ref >60)
Glucose, Bld: 83 mg/dL (ref 65–99)
POTASSIUM: 5 mmol/L (ref 3.5–5.1)
SODIUM: 137 mmol/L (ref 135–145)

## 2015-05-11 LAB — CBC
HCT: 29.1 % — ABNORMAL LOW (ref 39.0–52.0)
HEMOGLOBIN: 9.2 g/dL — AB (ref 13.0–17.0)
MCH: 26.7 pg (ref 26.0–34.0)
MCHC: 31.6 g/dL (ref 30.0–36.0)
MCV: 84.6 fL (ref 78.0–100.0)
PLATELETS: 296 10*3/uL (ref 150–400)
RBC: 3.44 MIL/uL — AB (ref 4.22–5.81)
RDW: 16.8 % — ABNORMAL HIGH (ref 11.5–15.5)
WBC: 7.5 10*3/uL (ref 4.0–10.5)

## 2015-05-11 SURGERY — SIGMOIDOSCOPY, FLEXIBLE
Anesthesia: Moderate Sedation | Laterality: Left

## 2015-05-11 MED ORDER — DIPHENHYDRAMINE HCL 50 MG/ML IJ SOLN
INTRAMUSCULAR | Status: AC
  Filled 2015-05-11: qty 1

## 2015-05-11 MED ORDER — MIDAZOLAM HCL 5 MG/ML IJ SOLN
INTRAMUSCULAR | Status: AC
  Filled 2015-05-11: qty 2

## 2015-05-11 MED ORDER — FENTANYL CITRATE (PF) 100 MCG/2ML IJ SOLN
INTRAMUSCULAR | Status: AC
  Filled 2015-05-11: qty 2

## 2015-05-11 MED ORDER — MIDAZOLAM HCL 10 MG/2ML IJ SOLN
INTRAMUSCULAR | Status: DC | PRN
  Administered 2015-05-11 (×2): 1 mg via INTRAVENOUS

## 2015-05-11 MED ORDER — FENTANYL CITRATE (PF) 100 MCG/2ML IJ SOLN
INTRAMUSCULAR | Status: DC | PRN
  Administered 2015-05-11 (×2): 12.5 ug via INTRAVENOUS

## 2015-05-11 NOTE — Care Management Important Message (Signed)
Important Message  Patient Details  Name: Martin Cain MRN: [id number] Date of Birth: 03/14/1946   Medicare Important Message Given:  Yes    Haskell FlirtJamison, Deauna Yaw 05/11/2015, 1:53 PMImportant Message  Patient Details  Name: Martin Cain MRN: [id number] Date of Birth: 09/06/1945   Medicare Important Message Given:  Yes    Haskell FlirtJamison, Momin Misko 05/11/2015, 1:53 PM

## 2015-05-11 NOTE — H&P (View-Only) (Signed)
Subjective: No complaints.  Objective: Vital signs in last 24 hours: Temp:  [97.4 F (36.3 C)-98.1 F (36.7 C)] 97.4 F (36.3 C) (11/10 0913) Pulse Rate:  [65-71] 71 (11/10 0913) Resp:  [18] 18 (11/10 0913) BP: (115-146)/(51-63) 146/59 mmHg (11/10 0913) SpO2:  [99 %-100 %] 100 % (11/10 0913) Weight:  [57.063 kg (125 lb 12.8 oz)] 57.063 kg (125 lb 12.8 oz) (11/10 0500) Weight change: 4.581 kg (10 lb 1.6 oz) Last BM Date: 05/09/15  PE: GEN:  Thin and cachectic-appearing, but is in no acute distress  Lab Results: CBC    Component Value Date/Time   WBC 6.1 05/10/2015 0523   RBC 3.42* 05/10/2015 0523   RBC 3.33* 05/10/2015 0523   HGB 9.0* 05/10/2015 0523   HCT 28.0* 05/10/2015 0523   PLT 309 05/10/2015 0523   MCV 84.1 05/10/2015 0523   MCH 27.0 05/10/2015 0523   MCHC 32.1 05/10/2015 0523   RDW 16.7* 05/10/2015 0523   LYMPHSABS 1.8 05/08/2015 1611   MONOABS 0.3 05/08/2015 1611   EOSABS 0.1 05/08/2015 1611   BASOSABS 0.0 05/08/2015 1611   CMP     Component Value Date/Time   NA 138 05/09/2015 0508   K 4.7 05/09/2015 0508   CL 98* 05/09/2015 0508   CO2 32 05/09/2015 0508   GLUCOSE 106* 05/09/2015 0508   BUN 84* 05/09/2015 0508   CREATININE 2.53* 05/09/2015 0508   CREATININE 2.52* 04/17/2015 0928   CALCIUM 9.1 05/09/2015 0508   PROT 7.5 05/09/2015 0508   ALBUMIN 3.8 05/09/2015 0508   AST 31 05/09/2015 0508   ALT <5* 05/09/2015 0508   ALKPHOS 70 05/09/2015 0508   BILITOT 1.0 05/09/2015 0508   GFRNONAA 24* 05/09/2015 0508   GFRAA 28* 05/09/2015 0508   Studies/Results: CT pelvis (without contrast): Possible asymmetrical rectal thickening  Assessment:  1.  Anemia and hemoccult positive stools.  No overt GI bleeding.  Endoscopy this year and colon[facility] last [facility], awaiting records. 2.  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 3.  Rectal thickening on CT scan,  Unclear etiology or significance.  Plan:  1.  Plan  for flexible sigmoidoscopy tomorrow, tentatively 1130 am, to assess rectal wall thickening.   2.  Really need outside endoscopy and colonoscopy records. 3.  Would like repeat Speech Therapy consultation prior to discharge, to see if he is going to be able to eat anything by mouth. 4.  Stop PEG feeds after midnight. 5.  Discussed case with patient's daughter over the phone, and she's in agreement with our plan. 6.  Will follow.   Freddy JakschOUTLAW,Tirza Senteno M 05/10/2015, 12:07 PM   Pager [phone] If no answer or after 5 PM call [phone]

## 2015-05-11 NOTE — Progress Notes (Signed)
SLP Cancellation Note  Patient Details Name: Martin DriversFrank Prouse MRN: [id number] DOB: 12/05/1945   Cancelled treatment:       Reason Eval/Treat Not Completed: Fatigue/lethargy limiting ability to participate. Pt arrived for recommended MBS, barely able to arouse following procedure. When asked do feel you are at your best?, pt able to say, "far from it" before falling asleep. Will defer MBS until pt able to participate.   Harlon DittyBonnie Charnelle Bergeman, [place] CCC-SLP [phone]   [name], Wilma Michaelson Caroline 05/11/2015, 1:41 PM

## 2015-05-11 NOTE — Interval H&P Note (Signed)
History and Physical Interval Note:  05/11/2015 8:10 AM  Read DriversFrank Lazalde  has presented today for surgery, with the diagnosis of anemia, hemoccult positive stools, abnormal CT scan (rectal wall thickening)  The various methods of treatment have been discussed with the patient and family. After consideration of risks, benefits and other options for treatment, the patient has consented to  Procedure(s): FLEXIBLE SIGMOIDOSCOPY (Left) as a surgical intervention .  The patient's history has been reviewed, patient examined, no change in status, stable for surgery.  I have reviewed the patient's chart and labs.  Questions were answered to the patient's satisfaction.     Declyn Delsol M  Assessment:  1.  Rectal wall thickening. 2.  Anemia.  Endoscopy and colonoscopy done in [place] within the past one year.  Plan:  1.  Flexible sigmoidoscopy for further evaluation. 2.  Risks (bleeding, infection, bowel perforation that could require surgery, sedation-related changes in cardiopulmonary systems), benefits (identification and possible treatment of source of symptoms, exclusion of certain causes of symptoms), and alternatives (watchful waiting, radiographic imaging studies, empiric medical treatment) of flexible sigmoidoscopy were explained to patient/family (daughter [name]) in detail and patient wishes to proceed.

## 2015-05-11 NOTE — Op Note (Signed)
[facility] [address] [place], [id number]   FLEXIBLE SIGMOIDOSCOPY PROCEDURE REPORT  PATIENT: Martin Cain, Martin Cain  MR#: [id number] BIRTHDATE: Nov 05, 1945 , 69  yrs. old GENDER: male ENDOSCOPIST: Willis ModenaWilliam Deztinee Lohmeyer, MD REFERRED BY:  [facility] PROCEDURE DATE:  05/11/2015 PROCEDURE:   Sigmoidoscopy, diagnostic ASA CLASS:   Class III INDICATIONS:anemia, hemoccult-positive stool, rectal thickening on CT scan. MEDICATIONS: Fentanyl 25 mcg IV and Versed 2 mg IV  DESCRIPTION OF PROCEDURE:   After the risks benefits and alternatives of the procedure were thoroughly explained, informed consent was obtained.  Digital exam revealed external hemorrhoids. The     endoscope was introduced through the anus  and advanced to the sigmoid colon , The exam was Without limitations.    The quality of the prep was    . Estimated blood loss is zero unless otherwise noted in this procedure report. The instrument was then slowly withdrawn as the mucosa was fully examined.    FINDINGS:  Tender anal canal with some non-thrombosed external hemorrhoids; no palpable anorectal mass.  Prep quality was adequate in rectum, after extensive lavage of rectal vault.  No mass or abnormality was seen in the distal sigmoid colon or rectum. Retroflexed view of rectum showed internal hemorrhoids, but was otherwise normal.               The scope was then withdrawn from the patient and the procedure terminated.  COMPLICATIONS: There were no immediate complications.  ENDOSCOPIC IMPRESSION: As above.  Internal and external hemorrhoids, could certainly account for patient's hemoccult-positive stool.  No rectal mass or inflammation.  No explanation for rectal thickening which was seen on recent CT scan; suspect CT finding was artifactual.  RECOMMENDATIONS: 1.  Watch for potential complications of procedure. 2.  Need outside endoscopy and colonoscopy reports. 3.  Speech therapy evaluation to reassess  his need for strict NPO status. 4.  [facility] will follow.  REPEAT EXAM:  eSigned:  Willis ModenaWilliam Castin Donaghue, MD 05/11/2015 8:52 AM   CC:

## 2015-05-11 NOTE — Progress Notes (Signed)
Nutrition Follow-up  DOCUMENTATION CODES:   Severe malnutrition in context of acute illness/injury, Underweight  INTERVENTION:  - Continue 1 can Jevity 1.5 five times daily with 50 mL free water before and after each bolus - RD will continue to monitor for needs  NUTRITION DIAGNOSIS:   Inadequate oral intake related to inability to eat as evidenced by NPO status. -ongoing  GOAL:   Patient will meet greater than or equal to 90% of their needs -met with TF regimen  MONITOR:   TF tolerance, Weight trends, Labs, Skin, I & O's  ASSESSMENT:   69 y.o. 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  11/11 Pt currently receiving can Jevity 1.5 five times/day (0600, 1000, 1400, 1800, 2200) with 50 mL free water before and after each bolus. This regimen will provide 100% kcal and protein needs: 1778 kcal, 75 grams protein, and 1400 mL free water.  SLP saw pt yesterday (11/10) and recommended he remain NPO with single ice chips only. Pt is s/p flex sig this AM. Per CSW note 11/10, pt to return to SNF upon d/c; RD will monitor for needs related to TF as it pertains to this.  Meeting needs with TF regimen at this time. Medications reviewed. Labs reviewed; BUN/creatinine elevated, GFR: 34.    11/9 - Pt is NPO at this time but chart review shows that pt consumed 75% dinner on Heart Healthy diet yesterday (11/8).  - Pt reports not having anything PO x2 days PTA.  - He had PEG placed 04/24/15 and states that he takes 4 cans of TF via PEG at home but is unsure of the formula that is used. - He is currently ordered to receive 1 can Jevity 1.5 TID which provides 1066 kcal (62% minimum estimated kcal needs), 45 grams protein (64% minimum estimated protein needs), and 540 mL free water. - Physical assessment shows moderate fat and severe muscle wasting.  - Pt  reports he gained 18 lbs in the past few months.  - Per chart review, pt has lost 3 lbs (2.5% body weight) in the past 14 days which is significant for time frame. -**RD to manage TF order placed after full assessment done this day   Diet Order:  Diet NPO time specified  Skin:  Wound (see comment) (Stage 1 sacral pressure ulcer)  Last BM:  11/9  Height:   Ht Readings from Last 1 Encounters:  05/08/15 _0  (1.753 m)    Weight:   Wt Readings from Last 1 Encounters:  05/11/15 126 lb 15.8 oz (57.6 kg)    Ideal Body Weight:  72.73 kg (kg)  BMI:  Body mass index is 18.74 kg/(m^2).  Estimated Nutritional Needs:   Kcal:  1829-9371  Protein:  70-80 grams  Fluid:  2-2.2 L/day  EDUCATION NEEDS:   No education needs identified at this time     Jarome Matin, RD, LDN Inpatient Clinical Dietitian Pager # [phone] After hours/weekend pager # [phone]

## 2015-05-11 NOTE — Progress Notes (Signed)
RN notified the PCP to obtain an order to travel without telemetry.  Awaiting new orders.

## 2015-05-11 NOTE — Progress Notes (Signed)
CSW continuing to follow.   Pt admitted [facility] and plans to return.  Per MD, anticipated d/c tomorrow.   CSW [facility] with [facility] that facility could accept [facility] back and [facility] authorization and pt can admit on Saturday or Sunday.   CSW updated pt daughter via telephone.  CSW to continue to follow to provide support and assist with pt disposition needs.   Loletta SpecterSuzanna Kidd, MSW, LCSW Clinical Social Work [phone]

## 2015-05-11 NOTE — Progress Notes (Signed)
[facility] PROGRESS NOTE  Martin DriversFrank Cain MWU:[id number] DOB: 12/22/1945 DOA: 05/08/2015 PCP: Pcp Not In System  Assessment/Plan: 1. Gi BLEED:  His stool for occult blood is positive on admission.  He received 2 untis and his repeat hemoglobin is stable around 9.  [facility] in the chart but the biopsy reports are not in, requested for the biopsy results today .  Gi consulted and recommended CT pelvis  Showed rectal wall thickening. Underwent flex sig showing hemorrhoids.  Improve his nutritional status.  SLP eval recommended MBS, but he was too sleepy to undergo the test.     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  Low iron levels.   Anemia suspect a combination of blood loss anemia from Gi and anemia of chronic disease.  Anemia panel shows low iron levels. Low saturation ratios, and ferritin of 26.   CAD S/P cabg: currently denies any chest pain . resum e home meds.    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 Family Communication: none at bedside updated daughter over the phone.  Disposition Plan: awaiting SLP eval before discharge back to SNF.    Consultants:  GI  Procedures:  none  Antibiotics:  none  HPI/Subjective: SLEEPY, he reports feeling exhausted.   Objective: Filed Vitals:   05/11/15 1413  BP: 122/57  Pulse: 64  Temp: 97.3 F (36.3 C)  Resp: 20    Intake/Output Summary (Last 24 hours) at 05/11/15 1738 Last data filed at 05/11/15 1720  Gross per 24 hour  Intake    680 ml  Output    200 ml  Net    480 ml   Filed Weights   05/09/15 0401 05/10/15 0500 05/11/15 0500  Weight: 53.434 kg (117 lb 12.8 oz) 57.063 kg (125 lb 12.8 oz) 57.6 kg (126 lb 15.8 oz)    Exam:   General:  Alert afebrile comfortable  Cardiovascular: s1s2  Respiratory: ctab  Abdomen: soft non tender  non distended bowel sounds heard.   Musculoskeletal: no pedal edema.   Data Reviewed: Basic Metabolic Panel:  Recent Labs Lab 05/08/15 1611 05/09/15 0508 05/11/15 0424  NA 135 138 137  K 4.9 4.7 5.0  CL 95* 98* 101  CO2 30 32 30  GLUCOSE 90 106* 83  BUN 87* 84* 71*  CREATININE 2.59* 2.53* 2.16*  CALCIUM 9.0 9.1 8.9   Liver Function Tests:  Recent Labs Lab 05/08/15 1611 05/09/15 0508  AST 30 31  ALT 11* <5*  ALKPHOS 65 70  BILITOT 0.7 1.0  PROT 7.0 7.5  ALBUMIN 3.6 3.8   No results for input(s): LIPASE, AMYLASE in the last 168 hours. No results for input(s): AMMONIA in the last 168 hours. CBC:  Recent Labs Lab 05/08/15 1611 05/09/15 0508 05/10/15 0523 05/11/15 0424  WBC 5.0 6.1 6.1 7.5  NEUTROABS 2.8  --   --   --   HGB 6.4* 9.7* 9.0* 9.2*  HCT 20.8* 30.4* 28.0* 29.1*  MCV 80.9 82.8 84.1 84.6  PLT 300 307 309 296   Cardiac Enzymes: No results for input(s): CKTOTAL, CKMB, CKMBINDEX, TROPONINI in the last 168 hours. BNP (last 3 results)  Recent Labs  04/17/15 2216  BNP 2795.6*    ProBNP (last 3 results) No results for input(s): PROBNP in the last 8760 hours.  CBG:  Recent Labs Lab 05/09/15 1654  GLUCAP 83    Recent Results (from the past 240 hour(s))  MRSA PCR Screening     Status: None   Collection Time: 05/08/15  6:42 PM  Result Value Ref Range Status   MRSA by PCR NEGATIVE NEGATIVE Final    Comment:        The GeneXpert MRSA Assay (FDA approved for NASAL specimens only), is one component of a comprehensive MRSA colonization surveillance program. It is not intended to diagnose MRSA infection nor to guide or monitor treatment for MRSA infections.      Studies: No results found.  Scheduled Meds: . allopurinol  100 mg Per Tube Daily  . amLODipine  10 mg Per Tube Daily  . atorvastatin  80 mg Per Tube q1800  . budesonide (PULMICORT) nebulizer solution  0.5 mg Nebulization BID  . carbidopa-levodopa  0.5 tablet Per Tube TID  .  carvedilol  25 mg Per Tube BID WC  . colchicine  0.6 mg Per Tube Daily  . feeding supplement (JEVITY 1.5 CAL/FIBER)  240 mL Per Tube 5 X Daily  . folic acid  1 mg Per Tube Daily  . free water  100 mL Per Tube 5 X Daily  . isosorbide dinitrate  40 mg Per Tube Daily  . mometasone-formoterol  2 puff Inhalation BID  . multivitamin with minerals  1 tablet Per Tube Daily  . pantoprazole sodium  40 mg Per Tube Daily   Continuous Infusions:   Active Problems:   Malnutrition of moderate degree (HCC)   Symptomatic anemia   Dysphagia   Anemia   Pressure ulcer   Protein-calorie malnutrition, severe    Time spent: 15 MIN    Rhonna Holster  [facility] Pager [phone] If 7PM-7AM, please contact night-coverage at [email or web address], password [facility] 05/11/2015, 5:38 PM  LOS: 3 days

## 2015-05-12 LAB — GLUCOSE, CAPILLARY
GLUCOSE-CAPILLARY: 83 mg/dL (ref 65–99)
GLUCOSE-CAPILLARY: 114 mg/dL — AB (ref 65–99)
Glucose-Capillary: 136 mg/dL — ABNORMAL HIGH (ref 65–99)
Glucose-Capillary: 142 mg/dL — ABNORMAL HIGH (ref 65–99)
Glucose-Capillary: 144 mg/dL — ABNORMAL HIGH (ref 65–99)
Glucose-Capillary: 155 mg/dL — ABNORMAL HIGH (ref 65–99)
Glucose-Capillary: 98 mg/dL (ref 65–99)

## 2015-05-12 NOTE — Progress Notes (Signed)
SLP Cancellation Note  Patient Details Name: Martin Cain MRN: [id number] DOB: 06/13/1946   Cancelled treatment:       Reason Eval/Treat Not Completed: Fatigue/lethargy limiting ability to participate; spoke with MD at length re: swallowing function and recent FEES results/recent po trials and determined that MBS would not give any new information at this point d/t fair prognosis.   ADAMS,PAT, M.S., CCC-SLP 05/12/2015, 3:52 PM

## 2015-05-12 NOTE — Progress Notes (Signed)
[facility] PROGRESS NOTE  Martin Cain ZOX:[id number] DOB: May 29, 1946 DOA: 05/08/2015 PCP: Pcp Not In System  Assessment/Plan: 1. Gi BLEED:  His stool for occult blood is positive on admission.  He received 2 untis and his repeat hemoglobin is stable around 9.  Outpatient records from [facility] in the chart but the biopsy reports are not in, requested for the biopsy results .  Gi consulted and recommended CT pelvis  Showed rectal wall thickening. Underwent flex sig showing hemorrhoids. No new abnormality  SLP eval  Initially recommended MBS, but he was too sleepy to undergo the test on 11/11, but today, after further eval and recent FEES, determined no need for MBS as nothing has changed int he last few weeks.  Plan to update the daughter and if they still want to take the risk pf aspiration, then patient can take po with strict aspiration precaution.      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  Low iron levels.   Anemia suspect a combination of blood loss anemia from Gi and anemia of chronic disease.  Anemia panel shows low iron levels. Low saturation ratios, and ferritin of 26. Recommended iron supplements on discharge.   CAD S/P cabg: currently denies any chest pain . resum e home meds.    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       Code Status: full code, changed from DNR. Family Communication: none at bedside taday, plan to update the daughter by the end of the day.   Disposition Plan: to [facility] in am.   Consultants:  GI  Procedures:  none  Antibiotics:  none  HPI/Subjective: No new complaints wants to know when he can go home.   Objective: Filed Vitals:   05/12/15 1350  BP: 131/54  Pulse: 62  Temp: 97.5 F (36.4 C)  Resp: 16    Intake/Output Summary (Last 24 hours) at 05/12/15 1840 Last data filed at 05/12/15 0550  Gross per 24 hour  Intake    680 ml  Output    500 ml   Net    180 ml   Filed Weights   05/10/15 0500 05/11/15 0500 05/12/15 0426  Weight: 57.063 kg (125 lb 12.8 oz) 57.6 kg (126 lb 15.8 oz) 56.7 kg (125 lb)    Exam:   General:  Alert afebrile comfortable  Cardiovascular: s1s2  Respiratory: ctab  Abdomen: soft non tender non distended bowel sounds heard.   Musculoskeletal: no pedal edema.   Data Reviewed: Basic Metabolic Panel:  Recent Labs Lab 05/08/15 1611 05/09/15 0508 05/11/15 0424  NA 135 138 137  K 4.9 4.7 5.0  CL 95* 98* 101  CO2 30 32 30  GLUCOSE 90 106* 83  BUN 87* 84* 71*  CREATININE 2.59* 2.53* 2.16*  CALCIUM 9.0 9.1 8.9   Liver Function Tests:  Recent Labs Lab 05/08/15 1611 05/09/15 0508  AST 30 31  ALT 11* <5*  ALKPHOS 65 70  BILITOT 0.7 1.0  PROT 7.0 7.5  ALBUMIN 3.6 3.8   No results for input(s): LIPASE, AMYLASE in the last 168 hours. No results for input(s): AMMONIA in the last 168 hours. CBC:  Recent Labs Lab 05/08/15 1611 05/09/15 0508 05/10/15 0523 05/11/15 0424  WBC 5.0 6.1 6.1 7.5  NEUTROABS 2.8  --   --   --   HGB 6.4* 9.7* 9.0* 9.2*  HCT 20.8* 30.4* 28.0* 29.1*  MCV 80.9 82.8 84.1 84.6  PLT 300  307 309 296   Cardiac Enzymes: No results for input(s): CKTOTAL, CKMB, CKMBINDEX, TROPONINI in the last 168 hours. BNP (last 3 results)  Recent Labs  04/17/15 2216  BNP 2795.6*    ProBNP (last 3 results) No results for input(s): PROBNP in the last 8760 hours.  CBG:  Recent Labs Lab 05/12/15 0003 05/12/15 0419 05/12/15 0934 05/12/15 1245 05/12/15 1639  GLUCAP 155* 83 98 136* 114*    Recent Results (from the past 240 hour(s))  MRSA PCR Screening     Status: None   Collection Time: 05/08/15  6:42 PM  Result Value Ref Range Status   MRSA by PCR NEGATIVE NEGATIVE Final    Comment:        The GeneXpert MRSA Assay (FDA approved for NASAL specimens only), is one component of a comprehensive MRSA colonization surveillance program. It is not intended to diagnose  MRSA infection nor to guide or monitor treatment for MRSA infections.      Studies: No results found.  Scheduled Meds: . allopurinol  100 mg Per Tube Daily  . amLODipine  10 mg Per Tube Daily  . atorvastatin  80 mg Per Tube q1800  . budesonide (PULMICORT) nebulizer solution  0.5 mg Nebulization BID  . carbidopa-levodopa  0.5 tablet Per Tube TID  . carvedilol  25 mg Per Tube BID WC  . colchicine  0.6 mg Per Tube Daily  . feeding supplement (JEVITY 1.5 CAL/FIBER)  240 mL Per Tube 5 X Daily  . folic acid  1 mg Per Tube Daily  . free water  100 mL Per Tube 5 X Daily  . isosorbide dinitrate  40 mg Per Tube Daily  . mometasone-formoterol  2 puff Inhalation BID  . multivitamin with minerals  1 tablet Per Tube Daily  . pantoprazole sodium  40 mg Per Tube Daily   Continuous Infusions:   Active Problems:   Malnutrition of moderate degree (HCC)   Symptomatic anemia   Dysphagia   Anemia   Pressure ulcer   Protein-calorie malnutrition, severe    Time spent: 15 MIN    Diyana Starrett  [facility] Pager [phone] If 7PM-7AM, please contact night-coverage at [email or web address], password [email or web address] 05/12/2015, 6:40 PM  LOS: 4 days

## 2015-05-12 NOTE — Progress Notes (Signed)
[name] 10:57 AM  Subjective: Patient doing well without any complaints and his hospital computer chart was reviewed and his case discussed with my partner and his faxed records were reviewed unfortunately they sent a 2009 colonoscopy but a hospital note said a recent endoscopy was normal and he had a colonoscopy last year and he tells me he's had 4 colonoscopies in his lifetime and he has not seen any blood and has no new complaints  Objective: Vital signs stable afebrile no acute distress abdomen is soft nontender no new labs swallowing eval pending  Assessment: Chronic anemia questionable etiology  Plan: Await swallowing eval and final neurologic diagnosis and will be on standby to help as needed  [facility],Martin Cain  Pager [phone] After 5PM or if no answer call [phone]

## 2015-05-12 NOTE — Clinical Social Work Note (Signed)
CSW informed [facility] that patient is not ready for discharge yet per MD.  [facility] did say if patient is ready on Sunday or Monday they can accept him.  CSW to continue to follow patient's progress.  Ervin KnackEric R. Markes Shatswell, MSW, Theresia MajorsLCSWA [phone] 05/12/2015 4:31 PM

## 2015-05-13 LAB — GLUCOSE, CAPILLARY
GLUCOSE-CAPILLARY: 84 mg/dL (ref 65–99)
GLUCOSE-CAPILLARY: 137 mg/dL — AB (ref 65–99)
Glucose-Capillary: 82 mg/dL (ref 65–99)

## 2015-05-13 MED ORDER — HYDROCORTISONE 2.5 % RE CREA
TOPICAL_CREAM | Freq: Four times a day (QID) | RECTAL | Status: DC
  Filled 2015-05-13: qty 28.35

## 2015-05-13 MED ORDER — HYDROCORTISONE 2.5 % RE CREA: TOPICAL_CREAM | Freq: Four times a day (QID) | RECTAL | Status: DC

## 2015-05-13 MED ORDER — FREE WATER: 100.0000 mL | Freq: Every day | Status: AC

## 2015-05-13 NOTE — Clinical Social Work Note (Signed)
CSW called pt's daughter and left voicemail message for call back to discuss discharge plans.  CSW called pt's significant other and left voicemail message for call back.  Unclear if this was correct phone number as a male voice was on the message.  CSW called and let [facility] know that pt is ready to be discharged back to their facility.  CSW also left message asking for additional family contacts.  Elray Buba.Breon Diss, LCSW [facility] Clinical Social Worker - Weekend Coverage cell #: [phone]

## 2015-05-13 NOTE — Clinical Social Work Note (Signed)
CSW facilitated discharge by calling [facility], preparing discharge packet, calling for transportion and providing packet to RN.   Family was contacted/Erica Daughter regarding discharge  .Martin Bubaegina Bryah Ocheltree, LCSW [facility] Clinical Social Worker - Weekend Coverage cell #: [phone]

## 2015-05-13 NOTE — Care Management Note (Signed)
Case Management Note  Patient Details  Name: Martin Cain MRN: [id number] Date of Birth: 02/20/1946  Subjective/Objective:     GI bleed, anemia               Action/Plan: Scheduled dc to SNF. CSW following for SNF placement.  Expected Discharge Date:  05/13/2015              Expected Discharge Plan:  Skilled Nursing Facility  In-House Referral:  Clinical Social Work  Discharge planning Services  CM Consult   Status of Service:  complete  Medicare Important Message Given:  Yes Date Medicare IM Given:    Medicare IM give by:    Date Additional Medicare IM Given:    Additional Medicare Important Message give by:     If discussed at Long Length of Stay Meetings, dates discussed:    Additional Comments:  Elliot CousinShavis, Zaire Vanbuskirk Ellen, RN 05/13/2015, 1:52 PM

## 2015-05-13 NOTE — Discharge Summary (Signed)
Physician Discharge Summary  [name] ONG:[id number] DOB: 02/14/1946 DOA: 05/08/2015  PCP: Pcp Not In System  Admit date: 05/08/2015 Discharge date: 05/13/2015  Time spent: 30 minutes  Recommendations for Outpatient Follow-up:  Follow up with PCP in one week.  Please check cbc and BMP in am to check hemoglobin and renal function.  Please follow up with gastroenterology and neurology as recommended.  Please follow up with SLP as outpatient as recommended by SLP.    Discharge Diagnoses:  Active Problems:   Malnutrition of moderate degree (HCC)   Symptomatic anemia   Dysphagia   Anemia   Pressure ulcer   Protein-calorie malnutrition, severe   Discharge Condition: improved  Diet recommendation: dysphagia 2 diet with nectar thick liquid , crush medications with puree , small sips and bites and strict aspiration precautions.   Filed Weights   05/11/15 0500 05/12/15 0426 05/13/15 0403  Weight: 57.6 kg (126 lb 15.8 oz) 56.7 kg (125 lb) 52.39 kg (115 lb 8 oz)    History of present illness:  [name] is a 69 y.o. 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   Hospital Course:  1. Gi BLEED: His stool for occult blood is positive on admission.  He received 2 untis and his repeat hemoglobin is stable around 9.  [facility] in the chart .  Gi consulted and recommended CT pelvis Showed rectal wall thickening. Underwent flex sig showing hemorrhoids. No new abnormality  SLP eval Initially recommended MBS, but he was too sleepy to undergo the test on 11/11, but today, after further eval and recent FEES, determined no need for MBS as nothing has changed int he last few weeks.  Follow [facility] with [facility] as  recommended.     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  Low iron levels.   Anemia suspect a combination of blood loss anemia from Gi and anemia of chronic disease.  Anemia panel shows low iron levels. Low saturation ratios, and ferritin of 26. Recommended iron supplements on discharge.   CAD S/P cabg: currently denies any chest pain . resume home meds.    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 And discussed the recommendations of SLP on 10/19, with the daughter and she feels he can go back to dysphagia 2 diet with nectar thick liquids and understand the aspiration complications.    Procedures:  fllex sigmoidoscopy  Consultations:  gastroenterology  On your next visit with your primary care physician please Get Medicines reviewed and adjusted.   Please request your Prim.MD to go over all Hospital Tests and Procedure/Radiological results at the follow up, please get all Hospital records sent to your Prim MD by signing hospital release before you go home.   If you experience worsening of your admission symptoms, develop shortness of breath, life threatening emergency, suicidal or homicidal thoughts you must seek medical attention immediately by calling 911 or calling your MD immediately if symptoms less severe.  You Must read complete instructions/literature along with all the possible adverse reactions/side effects for all the Medicines you take and that have been prescribed to you. Take any new Medicines after you have completely understood and accpet all the possible adverse reactions/side effects.   Do not drive,  operating heavy machinery, perform activities at heights, swimming or participation in water activities or provide baby sitting services if your were admitted for syncope or siezures until you have seen by Primary MD or a Neurologist and advised to do so again.  Do not drive when taking Pain medications.     Do not take more than prescribed Pain, Sleep and Anxiety Medications  Special Instructions: If you have smoked or chewed Tobacco in the last 2 yrs please stop smoking, stop any regular Alcohol and or any Recreational drug use.  Wear Seat belts while driving.   Please note  You were cared for by a hospitalist during your hospital stay. If you have any questions about your discharge medications or the care you received while you were in the hospital after you are discharged, you can call the unit and asked to speak with the hospitalist on call if the hospitalist that took care of you is not available. Once you are discharged, your primary care physician will handle any further medical issues. Please note that NO REFILLS for any discharge medications will be authorized once you are discharged, as it is imperative that you return to your primary care physician (or establish a relationship with a primary care physician if you do not have one) for your aftercare needs so that they can reassess your need for medications and monitor your lab values.  Discharge Exam: Filed Vitals:   05/13/15 0403  BP: 135/47  Pulse: 60  Temp: 97.9 F (36.6 C)  Resp: 16    General: alert comfortable Cardiovascular: s1s2 Respiratory: ctab  Discharge Instructions   Discharge Instructions    Diet - low sodium heart healthy    Complete by:  As directed      Discharge instructions    Complete by:  As directed   Follow up with [facility] gastroenterology as recommended.  Please follow up with SLP as recommended at SNF AS OUTPATIENT.  Follow up with neurology for progressive supranuclear palsy.  Please get CBC done to check hemoglobin tomorrow.          Current Discharge Medication List    START taking these medications   Details  hydrocortisone (ANUSOL-HC) 2.5 % rectal cream Place rectally 4 (four) times daily. Qty: 30 g, Refills: 0    Water For Irrigation, Sterile (FREE WATER) SOLN Place 100 mLs  into feeding tube 5 (five) times daily.      CONTINUE these medications which have NOT CHANGED   Details  allopurinol (ZYLOPRIM) 100 MG tablet Place 1 tablet (100 mg total) into feeding tube daily. Refills: 1    amLODipine (NORVASC) 10 MG tablet Place 1 tablet (10 mg total) into feeding tube daily. Qty: 30 tablet, Refills: 3    atorvastatin (LIPITOR) 80 MG tablet Place 1 tablet (80 mg total) into feeding tube daily. Refills: 1    carbidopa-levodopa (SINEMET) 25-100 MG tablet Take 0.5 tablets by mouth 3 (three) times daily. Qty: 90 tablet, Refills: 1    carvedilol (COREG) 25 MG tablet Place 1 tablet (25 mg total) into feeding tube 2 (two) times daily. Qty: 60 tablet, Refills: 6    Cholecalciferol (VITAMIN D PO) Place 1,000 mg into feeding tube daily.     colchicine 0.6 MG tablet Place 0.6 mg into feeding tube daily.     [employer] ASPIRIN LOW DOSE 81 MG EC tablet Take 81 mg by mouth daily. Refills: 0    Cyanocobalamin (VITAMIN B-12 PO) Place 1,000 mg into feeding tube daily.  ferrous sulfate 325 (65 FE) MG tablet Place 325 mg into feeding tube every morning.  Refills: 3    Fluticasone-Salmeterol (ADVAIR) 250-50 MCG/DOSE AEPB Inhale 1 puff into the lungs 2 (two) times daily.    folic acid (FOLVITE) 1 MG tablet Place 1 mg into feeding tube daily.  Refills: 3    furosemide (LASIX) 40 MG tablet Place 1 tablet (40 mg total) into feeding tube daily. Qty: 90 tablet, Refills: 3   Associated Diagnoses: Acute on chronic combined systolic and diastolic CHF (congestive heart failure) (HCC); Acute on chronic congestive heart failure, unspecified congestive heart failure type (HCC)    hydrALAZINE (APRESOLINE) 100 MG tablet Place 1 tablet (100 mg total) into feeding tube every morning. Refills: 3    ipratropium-albuterol (DUONEB) 0.5-2.5 (3) MG/3ML SOLN Inhale 3 mLs into the lungs every 6 (six) hours as needed (shortness of breath).  Refills: 1    isosorbide dinitrate (ISORDIL) 40 MG  tablet Place 40 mg into feeding tube daily.    Multiple Vitamin (MULTIVITAMIN WITH MINERALS) TABS tablet Take 1 tablet by mouth daily.    nitroGLYCERIN (NITROSTAT) 0.4 MG SL tablet Place 0.4 mg under the tongue every 5 (five) minutes as needed for chest pain.    Nutritional Supplements (FEEDING SUPPLEMENT, JEVITY 1.5 CAL/FIBER,) LIQD Place 240 mLs into feeding tube 3 (three) times daily.    pantoprazole sodium (PROTONIX) 40 mg/20 mL PACK Place 40 mg into feeding tube daily. In 30 ml of applesauce.    risperiDONE (RISPERDAL M-TABS) 1 MG disintegrating tablet Take 1 tablet (1 mg total) by mouth every evening. Qty: 30 tablet, Refills: 0    thiamine 100 MG tablet Take 1 tablet (100 mg total) by mouth daily.    albuterol (PROVENTIL HFA;VENTOLIN HFA) 108 (90 BASE) MCG/ACT inhaler Inhale 2 puffs into the lungs every 6 (six) hours as needed for wheezing or shortness of breath. Qty: 1 Inhaler, Refills: 3      STOP taking these medications     QVAR 80 MCG/ACT inhaler      pantoprazole (PROTONIX) 40 MG tablet        No Known Allergies Follow-up Information    Follow up with [facility] .   Specialty:  Skilled Nursing Facility   Contact information:   [address] [place] [address] [phone]      Follow up with Shirley Friar., MD.   Specialty:  Gastroenterology   Why:  As needed   Contact information:   [address] [place] [address] [phone]        The results of significant diagnostics from this hospitalization (including imaging, microbiology, ancillary and laboratory) are listed below for reference.    Significant Diagnostic Studies: Ct Abdomen Wo Contrast  2015-05-01  CLINICAL DATA:  Evaluate for percutaneous gastrostomy tube placement. EXAM: CT ABDOMEN WITHOUT CONTRAST TECHNIQUE: Multidetector CT imaging of the abdomen was performed following the standard protocol without IV contrast.  COMPARISON:  None. FINDINGS: Lower chest: The heart is markedly enlarged. No pericardial effusion. Coronary artery calcifications are noted. Advanced aortic calcifications without focal aneurysm. The distal esophagus is grossly normal. There are small bilateral pleural effusions and overlying atelectasis. Hepatobiliary: No obvious hepatic lesions or intrahepatic biliary dilatation. The gallbladder is grossly normal. No common bile duct dilatation. Pancreas: No mass, inflammation or ductal dilatation. Spleen: Small spleen.  No focal lesions. Adrenals/Urinary Tract: Numerous bilateral renal cysts and extensive renal artery calcifications. No hydronephrosis. Stomach/Bowel: The stomach, duodenum, visualized  small bowel and visualized colon are grossly normal without oral contrast. There is no interposing of the transverse colon between the anterior wall of the stomach and the anterior abdominal wall. The transverse colon is just below the stomach. Vascular/Lymphatic: Advanced atherosclerotic calcifications involving the aorta and branch vessels. Maximum aortic dimension is 29.5 mm. No obvious mesenteric or retroperitoneal mass or adenopathy. Scattered lymph nodes are noted. Other: No ascites or abdominal wall hernia Musculoskeletal: No significant osseous findings. IMPRESSION: 1. Small bilateral pleural effusions and overlying atelectasis. 2. Cardiac enlargement. 3. Advanced atherosclerotic calcifications involving the aorta and branch vessels. 4. Numerous bilateral renal cysts. 5. The transverse colon is not interposed between the anterior wall of the stomach and the anterior abdominal wall. Electronically Signed   By: Rudie Meyer M.D.   On: 04/20/2015 11:25   Ct Pelvis Wo Contrast  05/09/2015  CLINICAL DATA:  Heme-positive stools. Weight loss, failure to thrive. EXAM: CT PELVIS WITHOUT CONTRAST TECHNIQUE: Multidetector CT imaging of the pelvis was performed following the standard protocol without intravenous  contrast. COMPARISON:  CT abdomen 04/20/2015. FINDINGS: Low-attenuation lesions in the right kidney measure up to approximately 1.6 cm, difficult to characterize without post-contrast imaging. Visualized portions of the small bowel and colon are grossly unremarkable. Difficult to exclude eccentric rectal wall thickening (series 2, image 45). Prostate is normal in size. Bladder is grossly unremarkable. Atherosclerotic calcification of the arterial vasculature. Infrarenal aorta measures up to 2.9 cm. No free fluid. No definite pathologically enlarged lymph nodes. No worrisome lytic or sclerotic lesions. Remote sacral and left superior pubic rami fractures. IMPRESSION: 1. Difficult to exclude eccentric rectal wall thickening. No additional findings to explain the patient's history. 2. Mildly ectatic infrarenal aorta. Electronically Signed   By: Leanna Battles M.D.   On: 05/09/2015 16:30   Ir Gastrostomy Tube Mod Sed  04/23/2015  CLINICAL DATA:  69 year old male with a history of dysphagia. He presents for gastrostomy tube placement. EXAM: PERCUTANEOUS GASTROSTOMY FLUOROSCOPY TIME:  1 minutes 42 seconds MEDICATIONS AND MEDICAL HISTORY: Versed 1.0 mg, Fentanyl 50 mcg. ANESTHESIA/SEDATION: Moderate sedation time: 11 minutes CONTRAST:  10 cc through the tube PROCEDURE: The procedure, risks, benefits, and alternatives were explained to the patient and the patient's family. Questions regarding the procedure were encouraged and answered. The patient understands and consents to the procedure. The epigastrium was prepped with Betadine in a sterile fashion, and a sterile drape was applied covering the operative field. A sterile gown and sterile gloves were used for the procedure. A 5-French orogastric tube is placed under fluoroscopic guidance. Scout imaging of the abdomen confirms barium within the transverse colon. The stomach was distended with gas. Under fluoroscopic guidance, an 18 gauge needle was utilized to puncture  the anterior wall of the body of the stomach. An Amplatz wire was advanced through the needle passing a T fastener into the lumen of the stomach. The T fastener was secured for gastropexy. A 9-French sheath was inserted. A snare was advanced through the 9-French sheath. A Teena Dunk was advanced through the orogastric tube. It was snared then pulled out the oral cavity, pulling the snare, as well. The leading edge of the gastrostomy was attached to the snare. It was then pulled down the esophagus and out the percutaneous site. It was secured in place. Contrast was injected. Patient tolerated the procedure well and remained hemodynamically stable throughout. No complications encountered and no significant blood loss encountered. FINDINGS: The image demonstrates placement of a 20-French pull-through type gastrostomy tube into  the body of the stomach. IMPRESSION: Status post image guided percutaneous gastrostomy tube. Signed, Yvone Neu. Loreta Ave, DO [facility] Electronically Signed   By: Gilmer Mor D.O.   On: 04/23/2015 11:13    Microbiology: Recent Results (from the past 240 hour(s))  MRSA PCR Screening     Status: None   Collection Time: 05/08/15  6:42 PM  Result Value Ref Range Status   MRSA by PCR NEGATIVE NEGATIVE Final    Comment:        The GeneXpert MRSA Assay (FDA approved for NASAL specimens only), is one component of a comprehensive MRSA colonization surveillance program. It is not intended to diagnose MRSA infection nor to guide or monitor treatment for MRSA infections.      Labs: Basic Metabolic Panel:  Recent Labs Lab 05/08/15 1611 05/09/15 0508 05/11/15 0424  NA 135 138 137  K 4.9 4.7 5.0  CL 95* 98* 101  CO2 30 32 30  GLUCOSE 90 106* 83  BUN 87* 84* 71*  CREATININE 2.59* 2.53* 2.16*  CALCIUM 9.0 9.1 8.9   Liver Function Tests:  Recent Labs Lab 05/08/15 1611 05/09/15 0508  AST 30 31  ALT 11* <5*  ALKPHOS  65 70  BILITOT 0.7 1.0  PROT 7.0 7.5  ALBUMIN 3.6 3.8   No results for input(s): LIPASE, AMYLASE in the last 168 hours. No results for input(s): AMMONIA in the last 168 hours. CBC:  Recent Labs Lab 05/08/15 1611 05/09/15 0508 05/10/15 0523 05/11/15 0424  WBC 5.0 6.1 6.1 7.5  NEUTROABS 2.8  --   --   --   HGB 6.4* 9.7* 9.0* 9.2*  HCT 20.8* 30.4* 28.0* 29.1*  MCV 80.9 82.8 84.1 84.6  PLT 300 307 309 296   Cardiac Enzymes: No results for input(s): CKTOTAL, CKMB, CKMBINDEX, TROPONINI in the last 168 hours. BNP: BNP (last 3 results)  Recent Labs  04/17/15 2216  BNP 2795.6*    ProBNP (last 3 results) No results for input(s): PROBNP in the last 8760 hours.  CBG:  Recent Labs Lab 05/12/15 2106 05/12/15 2351 05/13/15 0357 05/13/15 0811 05/13/15 1237  GLUCAP 144* 142* 84 137* 82       Signed:  Lavaun Greenfield  [facility] 05/13/2015, 12:44 PM

## 2015-05-13 NOTE — [place] FL2 (Signed)
[place] MEDICAID FL2 LEVEL OF CARE SCREENING TOOL     IDENTIFICATION  Patient Name: Martin Cain Birthdate: [birth year] Sex: male Admission Date (Current Location): 05/08/2015  [facility] and [place] Number: Producer, television/film/video and Address:  [facility],  [address], [place] [address]      Provider Number: [id number]  Attending Physician Name and Address:  Kathlen Mody, MD  Relative Name and Phone Number:       Current Level of Care: Hospital Recommended Level of Care: Skilled Nursing Facility Prior Approval Number:    Date Approved/Denied:   PASRR Number: [id number]  Discharge Plan: SNF    Current Diagnoses: Patient Active Problem List   Diagnosis Date Noted  . Pressure ulcer 05/09/2015  . Protein-calorie malnutrition, severe 05/09/2015  . Anemia 05/08/2015  . Absolute anemia   . Dysphagia   . Palliative care encounter   . Symptomatic anemia 04/17/2015  . Malnutrition of moderate degree (HCC) 06/21/2013  . HTN (hypertension) 06/20/2013  . Acute on chronic combined systolic and diastolic heart failure (HCC) 06/20/2013  . Acute on chronic renal failure (HCC) 05/30/2013  . Acute on chronic combined systolic and diastolic CHF (congestive heart failure) (HCC) 05/28/2013  . Renal insufficiency 05/27/2013  . Acute exacerbation of CHF (congestive heart failure) (HCC) 05/27/2013  . Elevated troponin 05/27/2013  . Gout flare 05/27/2013  . 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 Normal PU Stage 1 Dressing: Daily        AMBULATORY STATUS RESPIRATION    Supervision limited Normal      Personal Care Assistance Level of Assistance  Bathing, Dressing,  Feeding Bathing Assistance: Limited assistance Feeding assistance: Limited assistance Dressing Assistance: Limited assistance      Functional Limitations Info  Sight Sight Info: Adequate           SPECIAL CARE FACTORS FREQUENCY  PT (By licensed PT)     PT Frequency: 5x/week             Additional Factors Info  Code Status, Allergies Code Status Info: full code Allergies Info: no known allergies           Current Medications (05/13/2015): Current Facility-Administered Medications  Medication Dose Route Frequency Provider Last Rate Last Dose  . allopurinol (ZYLOPRIM) tablet 100 mg  100 mg Per Tube Daily Zannie Cove, MD   100 mg at 05/13/15 0916  . amLODipine (NORVASC) tablet 10 mg  10 mg Per Tube Daily Zannie Cove, MD   10 mg at 05/13/15 0916  . atorvastatin (LIPITOR) tablet 80 mg  80 mg Per Tube q1800 Zannie Cove, MD   80 mg at 05/12/15 1711  . budesonide (PULMICORT) nebulizer solution 0.5 mg  0.5 mg Nebulization BID Zannie Cove, MD   0.5 mg at 05/13/15 0819  . carbidopa-levodopa (SINEMET IR) 25-100 MG per tablet immediate release 0.5 tablet  0.5 tablet Per Tube TID Zannie Cove, MD   0.5 tablet at 05/13/15 0916  . carvedilol (COREG) tablet 25 mg  25 mg Per Tube BID WC Zannie Cove, MD   25 mg at 05/13/15 0916  . colchicine tablet 0.6 mg  0.6 mg Per Tube Daily Zannie Cove, MD   0.6  mg at 05/13/15 0916  . feeding supplement (JEVITY 1.5 CAL/FIBER) liquid 240 mL  240 mL Per Tube 5 X Daily Anderson Malta [place], RD   240 mL at 05/13/15 0916  . folic acid (FOLVITE) tablet 1 mg  1 mg Per Tube Daily Zannie Cove, MD   1 mg at 05/13/15 0916  . free water 100 mL  100 mL Per Tube 5 X Daily Anderson Malta [place], RD   100 mL at 05/13/15 0917  . hydrocortisone (ANUSOL-HC) 2.5 % rectal cream   Rectal QID Kathlen Mody, MD      . ipratropium-albuterol (DUONEB) 0.5-2.5 (3) MG/3ML nebulizer solution 3 mL  3 mL Inhalation Q6H PRN Zannie Cove, MD      . isosorbide dinitrate  (ISORDIL) tablet 40 mg  40 mg Per Tube Daily Zannie Cove, MD   40 mg at 05/13/15 0916  . mometasone-formoterol (DULERA) 100-5 MCG/ACT inhaler 2 puff  2 puff Inhalation BID Zannie Cove, MD   2 puff at 05/13/15 [phone]  . morphine 2 MG/ML injection 1 mg  1 mg Intravenous Q4H PRN Leda Gauze, NP      . multivitamin with minerals tablet 1 tablet  1 tablet Per Tube Daily Zannie Cove, MD   1 tablet at 05/13/15 0916  . nitroGLYCERIN (NITROSTAT) SL tablet 0.4 mg  0.4 mg Sublingual Q5 min PRN Zannie Cove, MD      . ondansetron [facility]) tablet 4 mg  4 mg Oral Q6H PRN Zannie Cove, MD       Or  . ondansetron [facility]) injection 4 mg  4 mg Intravenous Q6H PRN Zannie Cove, MD      . pantoprazole sodium (PROTONIX) 40 mg/20 mL oral suspension 40 mg  40 mg Per Tube Daily Zannie Cove, MD   40 mg at 05/13/15 1191   Do not use this list as official medication orders. Please verify with discharge summary.  Discharge Medications:   Medication List    STOP taking these medications        QVAR 80 MCG/ACT inhaler  Generic drug:  beclomethasone      TAKE these medications        albuterol 108 (90 BASE) MCG/ACT inhaler  Commonly known as:  PROVENTIL HFA;VENTOLIN HFA  Inhale 2 puffs into the lungs every 6 (six) hours as needed for wheezing or shortness of breath.     allopurinol 100 MG tablet  Commonly known as:  ZYLOPRIM  Place 1 tablet (100 mg total) into feeding tube daily.     amLODipine 10 MG tablet  Commonly known as:  NORVASC  Place 1 tablet (10 mg total) into feeding tube daily.     atorvastatin 80 MG tablet  Commonly known as:  LIPITOR  Place 1 tablet (80 mg total) into feeding tube daily.     carbidopa-levodopa 25-100 MG tablet  Commonly known as:  SINEMET  Take 0.5 tablets by mouth 3 (three) times daily.     carvedilol 25 MG tablet  Commonly known as:  COREG  Place 1 tablet (25 mg total) into feeding tube 2 (two) times daily.     colchicine 0.6 MG tablet   Place 0.6 mg into feeding tube daily.     [facility] ASPIRIN LOW DOSE 81 MG EC tablet  Generic drug:  aspirin  Take 81 mg by mouth daily.     feeding supplement (JEVITY 1.5 CAL/FIBER) Liqd  Place 240 mLs into feeding tube 3 (three) times daily.  ferrous sulfate 325 (65 FE) MG tablet  Place 325 mg into feeding tube every morning.     Fluticasone-Salmeterol 250-50 MCG/DOSE Aepb  Commonly known as:  ADVAIR  Inhale 1 puff into the lungs 2 (two) times daily.     folic acid 1 MG tablet  Commonly known as:  FOLVITE  Place 1 mg into feeding tube daily.     free water Soln  Place 100 mLs into feeding tube 5 (five) times daily.     furosemide 40 MG tablet  Commonly known as:  LASIX  Place 1 tablet (40 mg total) into feeding tube daily.     hydrALAZINE 100 MG tablet  Commonly known as:  APRESOLINE  Place 1 tablet (100 mg total) into feeding tube every morning.     hydrocortisone 2.5 % rectal cream  Commonly known as:  ANUSOL-HC  Place rectally 4 (four) times daily.     ipratropium-albuterol 0.5-2.5 (3) MG/3ML Soln  Commonly known as:  DUONEB  Inhale 3 mLs into the lungs every 6 (six) hours as needed (shortness of breath).     isosorbide dinitrate 40 MG tablet  Commonly known as:  ISORDIL  Place 40 mg into feeding tube daily.     multivitamin with minerals Tabs tablet  Take 1 tablet by mouth daily.     nitroGLYCERIN 0.4 MG SL tablet  Commonly known as:  NITROSTAT  Place 0.4 mg under the tongue every 5 (five) minutes as needed for chest pain.     PROTONIX 40 mg/20 mL Pack  Generic drug:  pantoprazole sodium  Place 40 mg into feeding tube daily. In 30 ml of applesauce.     risperiDONE 1 MG disintegrating tablet  Commonly known as:  RISPERDAL M-TABS  Take 1 tablet (1 mg total) by mouth every evening.     thiamine 100 MG tablet  Take 1 tablet (100 mg total) by mouth daily.     VITAMIN B-12 PO  Place 1,000 mg into feeding tube daily.     VITAMIN D PO  Place 1,000 mg into  feeding tube daily.        Relevant Imaging Results:  Relevant Lab Results:  Recent Labs    Additional Information SSN: [id number]  Annetta MawKujawa,Will Heinkel G, LCSW

## 2015-05-14 ENCOUNTER — Encounter (HOSPITAL_COMMUNITY): Payer: Self-pay | Admitting: Gastroenterology

## 2015-05-16 ENCOUNTER — Observation Stay (HOSPITAL_COMMUNITY)
Admission: EM | Admit: 2015-05-16 | Discharge: 2015-05-19 | Disposition: A | Discharge status: Skilled Nursing Facility | Payer: Medicare HMO | Attending: Internal Medicine | Admitting: Internal Medicine

## 2015-05-16 ENCOUNTER — Encounter (HOSPITAL_COMMUNITY): Payer: Self-pay

## 2015-05-16 ENCOUNTER — Emergency Department (HOSPITAL_COMMUNITY): Payer: Medicare HMO

## 2015-05-16 DIAGNOSIS — N183 Chronic kidney disease, stage 3 unspecified: Secondary | ICD-10-CM | POA: Diagnosis present

## 2015-05-16 DIAGNOSIS — I2510 Atherosclerotic heart disease of native coronary artery without angina pectoris: Secondary | ICD-10-CM | POA: Diagnosis not present

## 2015-05-16 DIAGNOSIS — R1310 Dysphagia, unspecified: Secondary | ICD-10-CM | POA: Diagnosis not present

## 2015-05-16 DIAGNOSIS — R251 Tremor, unspecified: Principal | ICD-10-CM | POA: Insufficient documentation

## 2015-05-16 DIAGNOSIS — I5042 Chronic combined systolic (congestive) and diastolic (congestive) heart failure: Secondary | ICD-10-CM | POA: Insufficient documentation

## 2015-05-16 DIAGNOSIS — G231 Progressive supranuclear ophthalmoplegia [Steele-Richardson-Olszewski]: Secondary | ICD-10-CM | POA: Insufficient documentation

## 2015-05-16 DIAGNOSIS — Z79899 Other long term (current) drug therapy: Secondary | ICD-10-CM | POA: Diagnosis not present

## 2015-05-16 DIAGNOSIS — E43 Unspecified severe protein-calorie malnutrition: Secondary | ICD-10-CM

## 2015-05-16 DIAGNOSIS — I130 Hypertensive heart and chronic kidney disease with heart failure and stage 1 through stage 4 chronic kidney disease, or unspecified chronic kidney disease: Secondary | ICD-10-CM | POA: Diagnosis not present

## 2015-05-16 DIAGNOSIS — E785 Hyperlipidemia, unspecified: Secondary | ICD-10-CM | POA: Insufficient documentation

## 2015-05-16 DIAGNOSIS — I252 Old myocardial infarction: Secondary | ICD-10-CM | POA: Insufficient documentation

## 2015-05-16 DIAGNOSIS — G252 Other specified forms of tremor: Secondary | ICD-10-CM

## 2015-05-16 DIAGNOSIS — N179 Acute kidney failure, unspecified: Secondary | ICD-10-CM | POA: Diagnosis not present

## 2015-05-16 DIAGNOSIS — Z7982 Long term (current) use of aspirin: Secondary | ICD-10-CM | POA: Insufficient documentation

## 2015-05-16 DIAGNOSIS — R253 Fasciculation: Secondary | ICD-10-CM | POA: Diagnosis not present

## 2015-05-16 DIAGNOSIS — Z87891 Personal history of nicotine dependence: Secondary | ICD-10-CM | POA: Diagnosis not present

## 2015-05-16 DIAGNOSIS — D62 Acute posthemorrhagic anemia: Secondary | ICD-10-CM

## 2015-05-16 DIAGNOSIS — E875 Hyperkalemia: Secondary | ICD-10-CM

## 2015-05-16 DIAGNOSIS — Z8673 Personal history of transient ischemic attack (TIA), and cerebral infarction without residual deficits: Secondary | ICD-10-CM | POA: Diagnosis not present

## 2015-05-16 DIAGNOSIS — Z681 Body mass index (BMI) 19 or less, adult: Secondary | ICD-10-CM | POA: Insufficient documentation

## 2015-05-16 DIAGNOSIS — R259 Unspecified abnormal involuntary movements: Secondary | ICD-10-CM

## 2015-05-16 DIAGNOSIS — Z951 Presence of aortocoronary bypass graft: Secondary | ICD-10-CM | POA: Insufficient documentation

## 2015-05-16 DIAGNOSIS — D649 Anemia, unspecified: Secondary | ICD-10-CM | POA: Diagnosis present

## 2015-05-16 DIAGNOSIS — I10 Essential (primary) hypertension: Secondary | ICD-10-CM

## 2015-05-16 HISTORY — DX: Progressive supranuclear ophthalmoplegia (steele-Richardson-olszewski): G23.1

## 2015-05-16 LAB — CBC WITH DIFFERENTIAL/PLATELET
Basophils Absolute: 0 10*3/uL (ref 0.0–0.1)
Basophils Relative: 0 %
EOS ABS: 0.1 10*3/uL (ref 0.0–0.7)
EOS PCT: 1 %
HCT: 29.3 % — ABNORMAL LOW (ref 39.0–52.0)
Hemoglobin: 9.1 g/dL — ABNORMAL LOW (ref 13.0–17.0)
LYMPHS ABS: 2.4 10*3/uL (ref 0.7–4.0)
Lymphocytes Relative: 29 %
MCH: 26.2 pg (ref 26.0–34.0)
MCHC: 31.1 g/dL (ref 30.0–36.0)
MCV: 84.4 fL (ref 78.0–100.0)
MONOS PCT: 5 %
Monocytes Absolute: 0.4 10*3/uL (ref 0.1–1.0)
Neutro Abs: 5.3 10*3/uL (ref 1.7–7.7)
Neutrophils Relative %: 65 %
PLATELETS: 209 10*3/uL (ref 150–400)
RBC: 3.47 MIL/uL — ABNORMAL LOW (ref 4.22–5.81)
RDW: 16.4 % — ABNORMAL HIGH (ref 11.5–15.5)
WBC: 8.2 10*3/uL (ref 4.0–10.5)

## 2015-05-16 LAB — COMPREHENSIVE METABOLIC PANEL
ALT: 17 U/L (ref 17–63)
ANION GAP: 9 (ref 5–15)
AST: 33 U/L (ref 15–41)
Albumin: 3.9 g/dL (ref 3.5–5.0)
Alkaline Phosphatase: 72 U/L (ref 38–126)
BUN: 63 mg/dL — ABNORMAL HIGH (ref 6–20)
CHLORIDE: 103 mmol/L (ref 101–111)
CO2: 28 mmol/L (ref 22–32)
Calcium: 9.2 mg/dL (ref 8.9–10.3)
Creatinine, Ser: 2.41 mg/dL — ABNORMAL HIGH (ref 0.61–1.24)
GFR calc non Af Amer: 25 mL/min — ABNORMAL LOW (ref >60)
GFR, EST AFRICAN AMERICAN: 29 mL/min — AB (ref >60)
Glucose, Bld: 89 mg/dL (ref 65–99)
POTASSIUM: 5.5 mmol/L — AB (ref 3.5–5.1)
SODIUM: 140 mmol/L (ref 135–145)
Total Bilirubin: 0.6 mg/dL (ref 0.3–1.2)
Total Protein: 7.4 g/dL (ref 6.5–8.1)

## 2015-05-16 LAB — CK: Total CK: 99 U/L (ref 49–397)

## 2015-05-16 MED ORDER — IPRATROPIUM-ALBUTEROL 0.5-2.5 (3) MG/3ML IN SOLN: 3.0000 mL | Freq: Four times a day (QID) | RESPIRATORY_TRACT | Status: DC | PRN

## 2015-05-16 MED ORDER — AMLODIPINE BESYLATE 10 MG PO TABS: 10.0000 mg | ORAL_TABLET | Freq: Every day | ORAL | Status: DC

## 2015-05-16 MED ORDER — SODIUM CHLORIDE 0.9 % IV SOLN
INTRAVENOUS | Status: DC
  Administered 2015-05-16: 22:00:00 via INTRAVENOUS

## 2015-05-16 MED ORDER — FOLIC ACID 1 MG PO TABS
1.0000 mg | ORAL_TABLET | Freq: Every day | ORAL | Status: DC
  Administered 2015-05-17 – 2015-05-19 (×3): 1 mg
  Filled 2015-05-16 (×3): qty 1

## 2015-05-16 MED ORDER — CARBIDOPA-LEVODOPA 25-100 MG PO TABS
0.5000 | ORAL_TABLET | Freq: Three times a day (TID) | ORAL | Status: DC
  Administered 2015-05-16 – 2015-05-19 (×9): 0.5 via ORAL
  Filled 2015-05-16 (×10): qty 1

## 2015-05-16 MED ORDER — LORAZEPAM 2 MG/ML IJ SOLN
2.0000 mg | Freq: Once | INTRAMUSCULAR | Status: AC
  Administered 2015-05-16: 2 mg via INTRAVENOUS
  Filled 2015-05-16: qty 1

## 2015-05-16 MED ORDER — ATORVASTATIN CALCIUM 40 MG PO TABS
80.0000 mg | ORAL_TABLET | Freq: Every day | ORAL | Status: DC
  Administered 2015-05-17 – 2015-05-19 (×3): 80 mg
  Filled 2015-05-16 (×3): qty 2

## 2015-05-16 MED ORDER — ASPIRIN 81 MG PO CHEW
81.0000 mg | CHEWABLE_TABLET | Freq: Every day | ORAL | Status: DC
  Administered 2015-05-17 – 2015-05-19 (×3): 81 mg via ORAL
  Filled 2015-05-16 (×3): qty 1

## 2015-05-16 MED ORDER — FREE WATER
100.0000 mL | Freq: Every day | Status: DC
Start: 2015-05-16 — End: 2015-05-19
  Administered 2015-05-16 – 2015-05-19 (×14): 100 mL

## 2015-05-16 MED ORDER — VITAMIN B-12 1000 MCG PO TABS
1000.0000 ug | ORAL_TABLET | Freq: Every day | ORAL | Status: DC
  Administered 2015-05-17 – 2015-05-19 (×3): 1000 ug
  Filled 2015-05-16 (×3): qty 10

## 2015-05-16 MED ORDER — NITROGLYCERIN 0.4 MG SL SUBL: 0.4000 mg | SUBLINGUAL_TABLET | SUBLINGUAL | Status: DC | PRN

## 2015-05-16 MED ORDER — JEVITY 1.2 CAL PO LIQD
240.0000 mL | Freq: Every day | ORAL | Status: DC
  Administered 2015-05-16 – 2015-05-17 (×2): 240 mL
  Filled 2015-05-16: qty 237
  Filled 2015-05-16: qty 1000
  Filled 2015-05-16 (×3): qty 237
  Filled 2015-05-16: qty 1000

## 2015-05-16 MED ORDER — ONDANSETRON HCL 4 MG PO TABS: 4.0000 mg | ORAL_TABLET | Freq: Four times a day (QID) | ORAL | Status: DC | PRN

## 2015-05-16 MED ORDER — ONDANSETRON HCL 4 MG/2ML IJ SOLN: 4.0000 mg | Freq: Four times a day (QID) | INTRAMUSCULAR | Status: DC | PRN

## 2015-05-16 MED ORDER — VITAMIN B-1 100 MG PO TABS
100.0000 mg | ORAL_TABLET | Freq: Every day | ORAL | Status: DC
  Administered 2015-05-17 – 2015-05-19 (×3): 100 mg
  Filled 2015-05-16 (×3): qty 1

## 2015-05-16 MED ORDER — LEVETIRACETAM 500 MG/5ML IV SOLN
1000.0000 mg | Freq: Once | INTRAVENOUS | Status: DC
  Administered 2015-05-16: 1000 mg via INTRAVENOUS
  Filled 2015-05-16: qty 10

## 2015-05-16 MED ORDER — HYDRALAZINE HCL 50 MG PO TABS
100.0000 mg | ORAL_TABLET | Freq: Every day | ORAL | Status: DC
  Administered 2015-05-17 – 2015-05-19 (×3): 100 mg
  Filled 2015-05-16 (×3): qty 2

## 2015-05-16 MED ORDER — ACETAMINOPHEN 325 MG PO TABS: 650.0000 mg | ORAL_TABLET | Freq: Four times a day (QID) | ORAL | Status: DC | PRN

## 2015-05-16 MED ORDER — ALLOPURINOL 100 MG PO TABS
100.0000 mg | ORAL_TABLET | Freq: Every day | ORAL | Status: DC
  Administered 2015-05-17 – 2015-05-19 (×3): 100 mg
  Filled 2015-05-16 (×3): qty 1

## 2015-05-16 MED ORDER — RISPERIDONE 0.5 MG PO TBDP: 0.5000 mg | ORAL_TABLET | Freq: Every day | ORAL | Status: DC

## 2015-05-16 MED ORDER — COLCHICINE 0.6 MG PO TABS
0.3000 mg | ORAL_TABLET | Freq: Every day | ORAL | Status: DC
  Administered 2015-05-17 – 2015-05-19 (×3): 0.3 mg
  Filled 2015-05-16 (×3): qty 1

## 2015-05-16 MED ORDER — SODIUM POLYSTYRENE SULFONATE 15 GM/60ML PO SUSP
15.0000 g | Freq: Once | ORAL | Status: AC
  Administered 2015-05-16: 15 g
  Filled 2015-05-16: qty 60

## 2015-05-16 MED ORDER — ADULT MULTIVITAMIN W/MINERALS CH
1.0000 | ORAL_TABLET | Freq: Every day | ORAL | Status: DC
  Administered 2015-05-17 – 2015-05-19 (×3): 1
  Filled 2015-05-16 (×3): qty 1

## 2015-05-16 MED ORDER — HYDROMORPHONE HCL 1 MG/ML IJ SOLN
0.5000 mg | INTRAMUSCULAR | Status: DC | PRN
  Filled 2015-05-16: qty 1

## 2015-05-16 MED ORDER — ISOSORBIDE DINITRATE 20 MG PO TABS
40.0000 mg | ORAL_TABLET | Freq: Every day | ORAL | Status: DC
  Administered 2015-05-17 – 2015-05-19 (×3): 40 mg
  Filled 2015-05-16 (×3): qty 2

## 2015-05-16 MED ORDER — ACETAMINOPHEN 650 MG RE SUPP: 650.0000 mg | Freq: Four times a day (QID) | RECTAL | Status: DC | PRN

## 2015-05-16 MED ORDER — PANTOPRAZOLE SODIUM 40 MG PO PACK
40.0000 mg | PACK | Freq: Every day | ORAL | Status: DC
  Administered 2015-05-17 – 2015-05-19 (×3): 40 mg
  Filled 2015-05-16 (×3): qty 20

## 2015-05-16 MED ORDER — LEVETIRACETAM 500 MG PO TABS
500.0000 mg | ORAL_TABLET | Freq: Two times a day (BID) | ORAL | Status: DC
  Filled 2015-05-16: qty 1

## 2015-05-16 MED ORDER — SODIUM CHLORIDE 0.9 % IJ SOLN
3.0000 mL | Freq: Two times a day (BID) | INTRAMUSCULAR | Status: DC
  Administered 2015-05-16 – 2015-05-19 (×3): 3 mL via INTRAVENOUS

## 2015-05-16 MED ORDER — CARVEDILOL 25 MG PO TABS
25.0000 mg | ORAL_TABLET | Freq: Two times a day (BID) | ORAL | Status: DC
  Administered 2015-05-16 – 2015-05-19 (×6): 25 mg
  Filled 2015-05-16 (×7): qty 1

## 2015-05-16 NOTE — ED Notes (Signed)
Delay in labs due to pt not being in room 

## 2015-05-16 NOTE — ED Provider Notes (Addendum)
CSN: [id number]     Arrival date & time 05/16/15  1607 History   First MD Initiated Contact with Patient 05/16/15 1618     Chief Complaint  Patient presents with  . 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  Patient also has recent admission here for GI bleed, anemia. Discharged 2 days ago.  Patient is unable to clearly explain why he is here today. We called [place] nursing home. Reportedly the facial twitching and upper arm bilateral rigidity started today at noon. Patient has a history of progressive supranuclear palsy but people who know the patient says that this completely different than baseline.  Past Medical History  Diagnosis Date  . Hypertension   . Hyperlipidemia   . CKD (chronic kidney disease)     a. Probable stage III.  Marland Kitchen CAD (coronary artery disease)     a. CABG 2000 in [place]. b. NSTEMI 04/2013 felt due to malignant hypertension (was instructed to f/u [facility] for stress test).  . Gout   . CHF (congestive heart failure) (HCC)     a. Echo 04/2013: EF 45-50%, mild AS, mild biatrial enlargement, sev LVH.  . Osteoarthritis     R knee  . Anemia   . Progressive supranuclear palsies [facility])    Past Surgical History  Procedure Laterality Date  . Cardiac surgery      2000; [place]  . Appendectomy    . Abdominal exploration surgery    . Tonsillectomy    . Flexible sigmoidoscopy Left 05/11/2015    Procedure: FLEXIBLE SIGMOIDOSCOPY;  Surgeon: Willis Modena, MD;  Location: [facility];  Service: Endoscopy;  Laterality: Left;   Family History  Problem Relation Age of Onset  . Heart disease      No family history  . Cancer Father   . Hypertension Father   . Cancer Brother   . Cancer Sister   . Healthy Daughter    Social History  Substance Use Topics  . Smoking status: Former Smoker -- 0.50 packs/day for 40 years     Types: Cigarettes  . Smokeless tobacco: Never Used  . Alcohol Use: 0.0 oz/week    0 Standard drinks or equivalent per week     Comment: previously admitted to 1 pint per day. denies use for 1 month.    Review of Systems  All other systems reviewed and are negative.     Allergies  Review of patient's allergies indicates no known allergies.  Home Medications   Prior to Admission medications   Medication Sig Start Date End Date Taking? Authorizing Provider  allopurinol (ZYLOPRIM) 100 MG tablet Place 1 tablet (100 mg total) into feeding tube daily. 04/24/15  Yes Maryann Mikhail, DO  amLODipine (NORVASC) 10 MG tablet Place 1 tablet (10 mg total) into feeding tube daily. 04/24/15  Yes Maryann Mikhail, DO  atorvastatin (LIPITOR) 80 MG tablet Place 1 tablet (80 mg total) into feeding tube daily. 04/24/15  Yes Maryann Mikhail, DO  carbidopa-levodopa (SINEMET) 25-100 MG tablet Take 0.5 tablets by mouth 3 (three) times daily. 05/07/15  Yes Donika K Patel, DO  carvedilol (COREG) 25 MG tablet Place 1 tablet (25 mg total) into feeding tube 2 (two) times daily. 04/24/15  Yes Maryann Mikhail, DO  Cholecalciferol (VITAMIN D PO) Place 1,000 mg into feeding tube daily.    Yes Historical Provider, MD  colchicine 0.6 MG tablet Place 0.6 mg into feeding tube  daily.    Yes Historical Provider, MD  CVS ASPIRIN LOW DOSE 81 MG EC tablet Take 81 mg by mouth daily. 04/11/15  Yes Historical Provider, MD  Cyanocobalamin (VITAMIN B-12 PO) Place 1,000 mg into feeding tube daily.    Yes Historical Provider, MD  ferrous sulfate 325 (65 FE) MG tablet Place 325 mg into feeding tube every morning.  04/11/15  Yes Historical Provider, MD  Fluticasone-Salmeterol (ADVAIR) 250-50 MCG/DOSE AEPB Inhale 1 puff into the lungs 2 (two) times daily. 06/09/13  Yes Lars MassonKatarina H Nelson, MD  folic acid (FOLVITE) 1 MG tablet Place 1 mg into feeding tube daily.  04/11/15  Yes Historical Provider, MD  furosemide (LASIX) 40 MG tablet Place  1 tablet (40 mg total) into feeding tube daily. 04/24/15  Yes Maryann Mikhail, DO  hydrALAZINE (APRESOLINE) 100 MG tablet Place 1 tablet (100 mg total) into feeding tube every morning. 04/24/15  Yes Maryann Mikhail, DO  hydrocortisone (ANUSOL-HC) 2.5 % rectal cream Place rectally 4 (four) times daily. 05/13/15  Yes Kathlen ModyVijaya Akula, MD  isosorbide dinitrate (ISORDIL) 40 MG tablet Place 40 mg into feeding tube daily.   Yes Historical Provider, MD  Multiple Vitamin (MULTIVITAMIN WITH MINERALS) TABS tablet Take 1 tablet by mouth daily. Patient taking differently: Place 1 tablet into feeding tube daily.  04/24/15  Yes Maryann Mikhail, DO  Nutritional Supplements (FEEDING SUPPLEMENT, JEVITY 1.5 CAL/FIBER,) LIQD Place 240 mLs into feeding tube 3 (three) times daily. Patient taking differently: Place 240 mLs into feeding tube 6 (six) times daily.  04/24/15  Yes Maryann Mikhail, DO  pantoprazole sodium (PROTONIX) 40 mg/20 mL PACK Place 40 mg into feeding tube daily. In 30 ml of applesauce.   Yes Historical Provider, MD  risperiDONE (RISPERDAL M-TABS) 0.5 MG disintegrating tablet 0.5 mg by PEG Tube route daily.    Yes Historical Provider, MD  thiamine 100 MG tablet Take 1 tablet (100 mg total) by mouth daily. Patient taking differently: Place 100 mg into feeding tube daily.  04/24/15  Yes Maryann Mikhail, DO  Water For Irrigation, Sterile (FREE WATER) SOLN Place 100 mLs into feeding tube 5 (five) times daily. 05/13/15  Yes Kathlen ModyVijaya Akula, MD  albuterol (PROVENTIL HFA;VENTOLIN HFA) 108 (90 BASE) MCG/ACT inhaler Inhale 2 puffs into the lungs every 6 (six) hours as needed for wheezing or shortness of breath. 06/28/13   Lars MassonKatarina H Nelson, MD  ipratropium-albuterol (DUONEB) 0.5-2.5 (3) MG/3ML SOLN Inhale 3 mLs into the lungs every 6 (six) hours as needed (shortness of breath).  04/11/15   Historical Provider, MD  nitroGLYCERIN (NITROSTAT) 0.4 MG SL tablet Place 0.4 mg under the tongue every 5 (five) minutes as needed for  chest pain.    Historical Provider, MD  risperiDONE (RISPERDAL M-TABS) 1 MG disintegrating tablet Take 1 tablet (1 mg total) by mouth every evening. Patient not taking: Reported on 05/16/2015 05/31/13   Kela MillinAdeline C Viyuoh, MD   BP 108/56 mmHg  Pulse 79  Temp(Src) 98.5 F (36.9 C) (Oral)  Resp 18  SpO2 93% Physical Exam  Constitutional:  Patient is cachectic. Both upper arms are in a flexed position. Patient has severe facial twitching  HENT:  Head: Normocephalic.  Eyes: Conjunctivae are normal.  Neck: No tracheal deviation present.  Cardiovascular: Normal rate.   Pulmonary/Chest: Effort normal. No respiratory distress.  Abdominal: Soft. There is no tenderness. There is no guarding.  Neurological:  Patient has extensive facial twitching. Both arms held in flexion.  Skin: Skin is warm and dry. No rash  noted. He is not diaphoretic.  Nursing note and vitals reviewed.   ED Course  Procedures (including critical care time) Labs Review Labs Reviewed  CBC WITH DIFFERENTIAL/PLATELET - Abnormal; Notable for the following:    RBC 3.47 (*)    Hemoglobin 9.1 (*)    HCT 29.3 (*)    RDW 16.4 (*)    All other components within normal limits  COMPREHENSIVE METABOLIC PANEL  CK    Imaging Review Ct Head Wo Contrast  05/16/2015  CLINICAL DATA:  Progressive supranuclear palsy, fall, facial twitching, upper arm BILATERAL rigidity beginning at noon today, hypertension, coronary artery disease, CHF, former smoker EXAM: CT HEAD WITHOUT CONTRAST TECHNIQUE: Contiguous axial images were obtained from the base of the skull through the vertex without intravenous contrast. COMPARISON:  None; correlation MR brain 05/30/2013 FINDINGS: Generalized atrophy. Normal ventricular morphology. No midline shift or mass effect. Small old RIGHT basal ganglia lacunar infarct. Mild small vessel chronic ischemic changes of deep cerebral white matter. No intracranial hemorrhage, mass lesion, or evidence acute infarction. No  extra-axial fluid collections. Visualized paranasal sinuses and mastoid air cells clear. Extensive atherosclerotic calcifications at the carotid siphons. No acute osseous findings. IMPRESSION: Atrophy with small vessel chronic ischemic changes of deep cerebral white matter. Old RIGHT basal ganglia lacunar infarct. No acute intracranial abnormalities. Electronically Signed   By: Ulyses Southward M.D.   On: 05/16/2015 17:24   I have personally reviewed and evaluated these images and lab results as part of my medical decision-making.   EKG Interpretation None      MDM   Final diagnoses:  None   patient is 69 year old male with acute onset at 55 PM of symptoms of facial twitching and rigidity. Patient on multiple medications that could be causing this. Concern for NMS versus serotonin syndrome versus dystonic reaction versus worsening SNP. We'll touch base neurology.  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 Unsure whether this could be a partial seizure as well. We'll treat for both with Keppra and Ativan. They recommend admission to hospitalist and ?EEG.   7:41 PM Neurology ordered 2 ativan. Patient had a strong reaction to it, decreased his tremors but also his mental status. Maintainin airway safely.   Mahima Hottle Randall An, MD 05/16/15 1826  Park Beck Randall An, MD 05/16/15 1610

## 2015-05-16 NOTE — ED Notes (Signed)
His spastic upper body movements nearly cease ~ 4 min. After Ativan given IV.  The neurologist has just came and is speaking with him as I write this.

## 2015-05-16 NOTE — ED Notes (Signed)
Bed: [id number] Expected date:  Expected time:  Means of arrival:  Comments: Ems- elderly tremor

## 2015-05-16 NOTE — H&P (Signed)
[facility] Admission History and Physical       Martin Cain ZOX:[id number] DOB: 01/17/1946 DOA: 05/16/2015  Referring physician: EDP PCP: Pcp Not In System  Specialists:   Chief Complaint: Tremors and Facial Twitching  HPI: Martin DriversFrank Cain is a 69 y.o. male with a history of Progressive Supranuclear Palsy, CAD,HTN, CKD and recent hospitalization for GI Bleed and Anemia just discharged 2 days ago who was [facility] to the ED due to staff reports of facial tremors and tremors.   He was administered IV Ativan in the ED  X 1 dose , and is unable to give a history at this time.  A CT scan of the head was performed and was negative for acute findings, and lab studies revealed AKI with CKD and hyperkalemia.   He was referred for further evaluation.      Review of Systems: Unable to Obtain from the Patient  Past Medical History  Diagnosis Date  . Hypertension   . Hyperlipidemia   . CKD (chronic kidney disease)     a. Probable stage III.  Marland Kitchen. CAD (coronary artery disease)     a. CABG 2000 [place]. b. NSTEMI 04/2013 felt due to malignant hypertension (was instructed to f/u ATL for stress test).  . Gout   . CHF (congestive heart failure) (HCC)     a. Echo 04/2013: EF 45-50%, mild AS, mild biatrial enlargement, sev LVH.  . Osteoarthritis     R knee  . Anemia   . Progressive supranuclear palsies [facility](HCC)      Past Surgical History  Procedure Laterality Date  . Cardiac surgery      2000; [place]  . Appendectomy    . Abdominal exploration surgery    . Tonsillectomy    . Flexible sigmoidoscopy Left 05/11/2015    Procedure: FLEXIBLE SIGMOIDOSCOPY;  Surgeon: Willis ModenaWilliam Outlaw, MD;  Location: [facility];  Service: Endoscopy;  Laterality: Left;      Prior to Admission medications   Medication Sig Start Date End Date Taking? Authorizing Provider  allopurinol (ZYLOPRIM) 100 MG tablet Place 1 tablet (100 mg total) into feeding tube daily. 04/24/15  Yes Maryann Mikhail,  DO  amLODipine (NORVASC) 10 MG tablet Place 1 tablet (10 mg total) into feeding tube daily. 04/24/15  Yes Maryann Mikhail, DO  atorvastatin (LIPITOR) 80 MG tablet Place 1 tablet (80 mg total) into feeding tube daily. 04/24/15  Yes Maryann Mikhail, DO  carbidopa-levodopa (SINEMET) 25-100 MG tablet Take 0.5 tablets by mouth 3 (three) times daily. 05/07/15  Yes Donika K Patel, DO  carvedilol (COREG) 25 MG tablet Place 1 tablet (25 mg total) into feeding tube 2 (two) times daily. 04/24/15  Yes Maryann Mikhail, DO  Cholecalciferol (VITAMIN D PO) Place 1,000 mg into feeding tube daily.    Yes Historical Provider, MD  colchicine 0.6 MG tablet Place 0.6 mg into feeding tube daily.    Yes Historical Provider, MD  [facility] ASPIRIN LOW DOSE 81 MG EC tablet Take 81 mg by mouth daily. 04/11/15  Yes Historical Provider, MD  Cyanocobalamin (VITAMIN B-12 PO) Place 1,000 mg into feeding tube daily.    Yes Historical Provider, MD  ferrous sulfate 325 (65 FE) MG tablet Place 325 mg into feeding tube every morning.  04/11/15  Yes Historical Provider, MD  Fluticasone-Salmeterol (ADVAIR) 250-50 MCG/DOSE AEPB Inhale 1 puff into the lungs 2 (two) times daily. 06/09/13  Yes Lars MassonKatarina H Nelson, MD  folic acid (FOLVITE) 1 MG tablet Place 1 mg  into feeding tube daily.  04/11/15  Yes Historical Provider, MD  furosemide (LASIX) 40 MG tablet Place 1 tablet (40 mg total) into feeding tube daily. 04/24/15  Yes Maryann Mikhail, DO  hydrALAZINE (APRESOLINE) 100 MG tablet Place 1 tablet (100 mg total) into feeding tube every morning. 04/24/15  Yes Maryann Mikhail, DO  hydrocortisone (ANUSOL-HC) 2.5 % rectal cream Place rectally 4 (four) times daily. 05/13/15  Yes Kathlen Mody, MD  isosorbide dinitrate (ISORDIL) 40 MG tablet Place 40 mg into feeding tube daily.   Yes Historical Provider, MD  Multiple Vitamin (MULTIVITAMIN WITH MINERALS) TABS tablet Take 1 tablet by mouth daily. Patient taking differently: Place 1 tablet into feeding tube  daily.  04/24/15  Yes Maryann Mikhail, DO  Nutritional Supplements (FEEDING SUPPLEMENT, JEVITY 1.5 CAL/FIBER,) LIQD Place 240 mLs into feeding tube 3 (three) times daily. Patient taking differently: Place 240 mLs into feeding tube 6 (six) times daily.  04/24/15  Yes Maryann Mikhail, DO  pantoprazole sodium (PROTONIX) 40 mg/20 mL PACK Place 40 mg into feeding tube daily. In 30 ml of applesauce.   Yes Historical Provider, MD  risperiDONE (RISPERDAL M-TABS) 0.5 MG disintegrating tablet 0.5 mg by PEG Tube route daily.    Yes Historical Provider, MD  thiamine 100 MG tablet Take 1 tablet (100 mg total) by mouth daily. Patient taking differently: Place 100 mg into feeding tube daily.  04/24/15  Yes Maryann Mikhail, DO  Water For Irrigation, Sterile (FREE WATER) SOLN Place 100 mLs into feeding tube 5 (five) times daily. 05/13/15  Yes Kathlen Mody, MD  albuterol (PROVENTIL HFA;VENTOLIN HFA) 108 (90 BASE) MCG/ACT inhaler Inhale 2 puffs into the lungs every 6 (six) hours as needed for wheezing or shortness of breath. 06/28/13   Lars Masson, MD  ipratropium-albuterol (DUONEB) 0.5-2.5 (3) MG/3ML SOLN Inhale 3 mLs into the lungs every 6 (six) hours as needed (shortness of breath).  04/11/15   Historical Provider, MD  nitroGLYCERIN (NITROSTAT) 0.4 MG SL tablet Place 0.4 mg under the tongue every 5 (five) minutes as needed for chest pain.    Historical Provider, MD  risperiDONE (RISPERDAL M-TABS) 1 MG disintegrating tablet Take 1 tablet (1 mg total) by mouth every evening. Patient not taking: Reported on 05/16/2015 05/31/13   Kela Millin, MD     No Known Allergies  Social History:  reports that he has quit smoking. His smoking use included Cigarettes. He has a 20 pack-year smoking history. He has never used smokeless tobacco. He reports that he drinks alcohol. He reports that he does not use illicit drugs.    Family History  Problem Relation Age of Onset  . Heart disease      No family history  .  Cancer Father   . Hypertension Father   . Cancer Brother   . Cancer Sister   . Healthy Daughter        Physical Exam:  GEN:  Pleasant Thin Elderly  69 y.o. African American male examined and in no acute distress;  Filed Vitals:   05/16/15 1626 05/16/15 1854  BP: 108/56 149/72  Pulse: 79 85  Temp: 98.5 F (36.9 C)   TempSrc: Oral   Resp: 18 14  SpO2: 93% 99%   Blood pressure 149/72, pulse 85, temperature 98.5 F (36.9 C), temperature source Oral, resp. rate 14, SpO2 99 %. PSYCH: He is Obtunded,   HEENT: Normocephalic and Atraumatic, Mucous membranes pink; PERRLA; EOM intact; Fundi:  Benign;  No scleral icterus, Nares: Patent, Oropharynx:  Clear,   Neck:  FROM, No Cervical Lymphadenopathy nor Thyromegaly or Carotid Bruit; No JVD; Breasts:: Not examined CHEST WALL: No tenderness CHEST: Normal respiration, clear to auscultation bilaterally HEART: Regular rate and rhythm; no murmurs rubs or gallops BACK: No kyphosis or scoliosis; No CVA tenderness ABDOMEN: Positive Bowel Sounds, Scaphoid, PEG ,  Soft Non-Tender, No Rebound or Guarding; No Masses, No Organomegaly. Rectal Exam: Not done EXTREMITIES: No Cyanosis, Clubbing, or Edema; No Ulcerations. Genitalia: not examined PULSES: 2+ and symmetric SKIN: Normal hydration no rash or ulceration CNS:  Alert and Oriented x 0, Sedated,   Able to move all 4 Extremties Unable to Cooperate with Neuro exam at this time Vascular: pulses palpable throughout    Labs on Admission:  Basic Metabolic Panel:  Recent Labs Lab 05/11/15 0424 05/16/15 1757  NA 137 140  K 5.0 5.5*  CL 101 103  CO2 30 28  GLUCOSE 83 89  BUN 71* 63*  CREATININE 2.16* 2.41*  CALCIUM 8.9 9.2   Liver Function Tests:  Recent Labs Lab 05/16/15 1757  AST 33  ALT 17  ALKPHOS 72  BILITOT 0.6  PROT 7.4  ALBUMIN 3.9   No results for input(s): LIPASE, AMYLASE in the last 168 hours. No results for input(s): AMMONIA in the last 168 hours. CBC:  Recent  Labs Lab 05/10/15 0523 05/11/15 0424 05/16/15 1757  WBC 6.1 7.5 8.2  NEUTROABS  --   --  5.3  HGB 9.0* 9.2* 9.1*  HCT 28.0* 29.1* 29.3*  MCV 84.1 84.6 84.4  PLT 309 296 209   Cardiac Enzymes:  Recent Labs Lab 05/16/15 1757  CKTOTAL 99    BNP (last 3 results)  Recent Labs  04/17/15 2216  BNP 2795.6*    ProBNP (last 3 results) No results for input(s): PROBNP in the last 8760 hours.  CBG:  Recent Labs Lab 05/12/15 2106 05/12/15 2351 05/13/15 0357 05/13/15 0811 05/13/15 1237  GLUCAP 144* 142* 84 137* 82    Radiological Exams on Admission: Ct Head Wo Contrast  05/16/2015  CLINICAL DATA:  Progressive supranuclear palsy, fall, facial twitching, upper arm BILATERAL rigidity beginning at noon today, hypertension, coronary artery disease, CHF, former smoker EXAM: CT HEAD WITHOUT CONTRAST TECHNIQUE: Contiguous axial images were obtained from the base of the skull through the vertex without intravenous contrast. COMPARISON:  None; correlation MR brain 05/30/2013 FINDINGS: Generalized atrophy. Normal ventricular morphology. No midline shift or mass effect. Small old RIGHT basal ganglia lacunar infarct. Mild small vessel chronic ischemic changes of deep cerebral white matter. No intracranial hemorrhage, mass lesion, or evidence acute infarction. No extra-axial fluid collections. Visualized paranasal sinuses and mastoid air cells clear. Extensive atherosclerotic calcifications at the carotid siphons. No acute osseous findings. IMPRESSION: Atrophy with small vessel chronic ischemic changes of deep cerebral white matter. Old RIGHT basal ganglia lacunar infarct. No acute intracranial abnormalities. Electronically Signed   By: Ulyses Southward M.D.   On: 05/16/2015 17:24     EKG: Independently reviewed.     Assessment/Plan:      69 y.o. male with  Principal Problem:   1.    Tremors of nervous system- due to supranuclear Plasy or Seizure   Neuro Consult   EEG in AM   Continue  Sinemet Rx  Active Problems:   2.    AKI (acute kidney injury) (HCC)   Hold Lasix Overnight   Gentle IVFs    3.    Hyperkalemia   Kayexalate 15 grams Per FT x 1  Monitor K+ levels        4.    Chronic combined systolic and diastolic congestive heart failure (HCC)   Continue Lasix, Carvedilol, Hydralazine, Imdur   Monitor I/Os     5.    Acute blood loss anemia   On Protonix   On Ferrous Sulfate Supplement     6.   Supranuclear palsies, progressive (HCC)   On Sinemet   Neuro consulted     7.    Hypertension   Continue Amlodipine, Hydralazine, Carvedilol     8.    CKD (chronic kidney disease), stage III   Monitor BUN/Cr    9.    Protein-calorie malnutrition, severe   On Tube Feedings   10.    DVT Prophylaxis   SCDs   Code Status:     FULL CODE      Family Communication:   Significant Other at Bedside, Spoke to Daughter [name] [facility]) via Telephone     Disposition Plan:    Observation Status        Time spent:  70 Minutes      Ron Parker [facility] Pager [phone]   If 7AM -7PM Please Contact the Day Rounding Team MD for [facility]  If 7PM-7AM, Please Contact Night-Floor Coverage  [email or web address] Password [email or web address] 05/16/2015, 8:02 PM     ADDENDUM:   Patient was seen and examined on 05/16/2015

## 2015-05-16 NOTE — ED Notes (Signed)
[facility]/o tremor.  He is not acutely ill in appearance and is in no distress.

## 2015-05-16 NOTE — ED Notes (Signed)
He had become somnolent enough that we inserted a #7 nasopharyngeal airway in right nare in the presence of the neurologist.  At no time did he have airway compromise, however, his respirations had become sonorous.  Also, at no time, did his spo2 go below 98% on rm. Air.  Some clonic movements still observed in tongue and about the mouth and lower face.

## 2015-05-16 NOTE — Consult Note (Signed)
Neurology Consultation Reason for Consult: Abnormal Movements Referring Physician: Corlis LeakMackuen, C  CC: Abnormal movements  History is obtained from:Patient, girlfriend  HPI: Martin Cain is a 69 y.o. male with a history of PSP(per patient, diagnosed at [facility] [place] but very unclear history) who presents with abnormal movements. He has twitching that predominantly involves the right side of the face and right arm, but there is involvement of bilateral legs and left arm as well. He states that it happened this AM as well. His girlfriend reports that he had 3 episodes in [place] with more severe similar jerking that was "all over" and had preserved consciousness with these episodes as well.   A CT scan in the ER is negative.   ROS: A 14 point ROS was performed and is negative except as noted in the HPI.   Past Medical History  Diagnosis Date  . Hypertension   . Hyperlipidemia   . CKD (chronic kidney disease)     a. Probable stage III.  Marland Kitchen. CAD (coronary artery disease)     a. CABG 2000 [place]. b. NSTEMI 04/2013 felt due to malignant hypertension (was instructed to f/u ATL for stress test).  . Gout   . CHF (congestive heart failure) (HCC)     a. Echo 04/2013: EF 45-50%, mild AS, mild biatrial enlargement, sev LVH.  . Osteoarthritis     R knee  . Anemia   . Progressive supranuclear palsies (HCC)      Family History  Problem Relation Age of Onset  . Heart disease      No family history  . Cancer Father   . Hypertension Father   . Cancer Brother   . Cancer Sister   . Healthy Daughter      Social History:  reports that he has quit smoking. His smoking use included Cigarettes. He has a 20 pack-year smoking history. He has never used smokeless tobacco. He reports that he drinks alcohol. He reports that he does not use illicit drugs.   Exam: Current vital signs: BP 108/56 mmHg  Pulse 79  Temp(Src) 98.5 F (36.9 C) (Oral)  Resp 18  SpO2 93% Vital signs in last 24  hours: Temp:  [98.5 F (36.9 C)] 98.5 F (36.9 C) (11/16 1626) Pulse Rate:  [79] 79 (11/16 1626) Resp:  [18] 18 (11/16 1626) BP: (108)/(56) 108/56 mmHg (11/16 1626) SpO2:  [93 %] 93 % (11/16 1626)   Physical Exam  Constitutional: Appears well-developed and well-nourished.  Psych: Affect appropriate to situation Eyes: No scleral injection HENT: No OP obstrucion Head: Normocephalic.  Cardiovascular: Normal rate and regular rhythm.  Respiratory: Effort normal and breath sounds normal to anterior ascultation GI: Soft.  No distension. There is no tenderness.  Skin: WDI  Neuro: Mental Status: Patient is awake, alert, oriented to person, place, month, year, and situation. Patient is able to give a clear and coherent history. No signs of aphasia or neglect Cranial Nerves: II: Visual Fields are full. Pupils are equal, round, and reactive to light.   III,IV, VI: He has severe impairment of both upgaze and downgaze.  V: Facial sensation is symmetric to temperature VII: Facial movement is symmetric.  VIII: hearing is intact to voice X: Uvula elevates symmetrically XI: Shoulder shrug is symmetric. XII: tongue is midline without atrophy or fasciculations.  Motor:  Bulk is normal. 5/5 strength was present in all four extremities. He holds his arms flexed, but with prompting, he is able to relax both arms. ? Mildly  increased tone, but not markedly so.  Sensory: Sensation is symmetric to light touch and temperature in the arms and legs. Cerebellar: No ataxia, but impaired due to abnormal movements.   He has frequent R > left clonic movements. These could be consistent with myoclonus but the prediliction for the right arm and face could be worrisome for seizure.     I have reviewed labs in epic and the results pertinent to this consultation are: Previous GFR 34  I have reviewed the images obtained: CT head - significant atrophy, ? Brainstem out of proportion to rest of brain.    Impression: 69 yo M with ? Diagnosis of PSP and new onset abnormal movements.   The character of the right sided movements could be consistent with partial seizure, but the involvement of all 4 extremities with preserved conciousness I feel would argue for more of a multifocal myoclonus as opposed to seizure. Either way, this could respond to benzodiazepine. Also, the history of previous episodes, all with all 4 extremities involved with preserved consciousness would argue against seizure.   Recommendations: 1) Ativan  x 1.  2) If movements continue, would load with keppra  x 1, followed by  BID(GFR noted). Would not be more aggressive following this unless there is a change in clinical status.  3) would get EEG in the AM.  4) Obtain outside records.  5) I would hold risperidone for now.    Ritta Slot, MD [facility] [phone]  If 7pm- 7am, please page neurology on call as listed in AMION.

## 2015-05-17 ENCOUNTER — Observation Stay (HOSPITAL_COMMUNITY)
Admit: 2015-05-17 | Discharge: 2015-05-17 | Disposition: A | Discharge status: Home / Self Care | Payer: Medicare HMO | Attending: Neurology | Admitting: Neurology

## 2015-05-17 DIAGNOSIS — D649 Anemia, unspecified: Secondary | ICD-10-CM

## 2015-05-17 DIAGNOSIS — R251 Tremor, unspecified: Secondary | ICD-10-CM | POA: Diagnosis not present

## 2015-05-17 DIAGNOSIS — I5042 Chronic combined systolic (congestive) and diastolic (congestive) heart failure: Secondary | ICD-10-CM | POA: Diagnosis not present

## 2015-05-17 DIAGNOSIS — I10 Essential (primary) hypertension: Secondary | ICD-10-CM | POA: Diagnosis not present

## 2015-05-17 DIAGNOSIS — G231 Progressive supranuclear ophthalmoplegia [Steele-Richardson-Olszewski]: Secondary | ICD-10-CM | POA: Diagnosis not present

## 2015-05-17 DIAGNOSIS — N183 Chronic kidney disease, stage 3 (moderate): Secondary | ICD-10-CM | POA: Diagnosis not present

## 2015-05-17 LAB — CBC
HCT: 34.8 % — ABNORMAL LOW (ref 39.0–52.0)
Hemoglobin: 10.8 g/dL — ABNORMAL LOW (ref 13.0–17.0)
MCH: 26.9 pg (ref 26.0–34.0)
MCHC: 31 g/dL (ref 30.0–36.0)
MCV: 86.6 fL (ref 78.0–100.0)
PLATELETS: 207 10*3/uL (ref 150–400)
RBC: 4.02 MIL/uL — AB (ref 4.22–5.81)
RDW: 16.7 % — AB (ref 11.5–15.5)
WBC: 5.5 10*3/uL (ref 4.0–10.5)

## 2015-05-17 LAB — BASIC METABOLIC PANEL
Anion gap: 9 (ref 5–15)
BUN: 59 mg/dL — AB (ref 6–20)
CALCIUM: 9.2 mg/dL (ref 8.9–10.3)
CO2: 28 mmol/L (ref 22–32)
CREATININE: 2.1 mg/dL — AB (ref 0.61–1.24)
Chloride: 102 mmol/L (ref 101–111)
GFR calc non Af Amer: 30 mL/min — ABNORMAL LOW (ref >60)
GFR, EST AFRICAN AMERICAN: 35 mL/min — AB (ref >60)
Glucose, Bld: 99 mg/dL (ref 65–99)
Potassium: 4.6 mmol/L (ref 3.5–5.1)
SODIUM: 139 mmol/L (ref 135–145)

## 2015-05-17 MED ORDER — CETYLPYRIDINIUM CHLORIDE 0.05 % MT LIQD
7.0000 mL | Freq: Two times a day (BID) | OROMUCOSAL | Status: DC
  Administered 2015-05-17 – 2015-05-19 (×5): 7 mL via OROMUCOSAL

## 2015-05-17 MED ORDER — CHLORHEXIDINE GLUCONATE 0.12 % MT SOLN
15.0000 mL | Freq: Two times a day (BID) | OROMUCOSAL | Status: DC
  Administered 2015-05-17 – 2015-05-19 (×5): 15 mL via OROMUCOSAL
  Filled 2015-05-17 (×6): qty 15

## 2015-05-17 MED ORDER — JEVITY 1.2 CAL PO LIQD
240.0000 mL | Freq: Every day | ORAL | Status: DC
  Administered 2015-05-17 – 2015-05-19 (×10): 240 mL
  Administered 2015-05-19 (×2): 237 mL
  Filled 2015-05-17 (×15): qty 474

## 2015-05-17 MED ORDER — FERROUS SULFATE 325 (65 FE) MG PO TABS
325.0000 mg | ORAL_TABLET | ORAL | Status: DC
Start: 2015-05-18 — End: 2015-05-19
  Administered 2015-05-18 – 2015-05-19 (×2): 325 mg via ORAL
  Filled 2015-05-17 (×2): qty 1

## 2015-05-17 MED ORDER — FUROSEMIDE 40 MG PO TABS
40.0000 mg | ORAL_TABLET | Freq: Every day | ORAL | Status: DC
  Administered 2015-05-17 – 2015-05-18 (×2): 40 mg
  Filled 2015-05-17 (×2): qty 1

## 2015-05-17 NOTE — Progress Notes (Signed)
Subjective: No further twitching.   Exam: Filed Vitals:   05/17/15 0959  BP: 135/80  Pulse:   Temp:   Resp:    Gen: In bed, NAD Resp: non-labored breathing, no acute distress Abd: soft, nt  Neuro: MS: Awakens to minor stimuli, oriented to month, gives years 2017. He is still very drowsy, and does not cooperate fully with the examination. CN: Pupils equal round reactive, keep his eyes tightly closed and does not cooperate with eye movements this morning. Motor: Moves all extremity as well, he resists movement, but it is unclear if this is increased tone or active resistance. Sensory: Endorses sensation bilaterally  Pertinent Labs: GFR 35    I have reviewed his extensive outside records from [facility] and he was admitted in March of this year. He had an MRI that showed atrophy, EEG during a period of extensive myoclonus which did not show corresponding seizure activity, lumbar puncture which had negative AFB, fungal, bacterial cultures. I was unable to find the exact numbers, but the discharge summary reports his lumbar puncture results as "normal."  it was felt at that time that this was most consistent with PSP. He was [facility] to [facility], but it does not appear that he ever did so. Since that time, he has continued to have increasing falls and has become nursing home bound. He continues to have prominent gaze palsy most affecting the vertical eye movements. I agree that this most likely represents PSP.   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 Though he does have mild renal insufficiency, is not to the point that I would expect sudden onset myoclonus as what he demonstrated. Given the bilateral involvement, and previous EEG capturing an episode with no EEG correlate, I feel that seizure is less likely though a small simple partial seizure affecting the supplementary  motor cortex could conceivably be possible.   I do wonder if this could be related to his risperdone given that this medication can have a profound effect on people with PSP and I would not favor continuing it. It appears that this was started in 2014 after an episode of delirium.  If he does need neuroleptic then seroquel could be a better choice but it is unclear to me what diagnosis prompted the continuation of this medication.  Given his marked sedation, I would favor holding keppra for now. If he were to have further episodes, then I would favor restarting either keppra or depakote.   Recommendations: 1) Discontinue risperidone 2) Discontinue keppra due to sedation 3) EEG 4) will continue to follow.   Ritta SlotMcNeill Monroe Qin, MD [facility] [phone]  If 7pm- 7am, please page neurology on call as listed in AMION.

## 2015-05-17 NOTE — Progress Notes (Signed)
EEG completed; results pending.    

## 2015-05-17 NOTE — Progress Notes (Addendum)
[facility] PROGRESS NOTE    Progress Note   Martin Cain ZOX:[id number] DOB: 31-Jan-1946 DOA: 05/16/2015 PCP: Pcp Not In System   Brief Narrative:   Martin Cain is an 69 y.o. male   Assessment/Plan:   Tremors of nervous system/  Supranuclear palsies, progressive [facility]): Neurology was consulted and recommended to get an EEG, he was given 2 mg of Ativan. Patient is significantly somnolent today. We'll continue Sinemet Awaiting for outside records. Continue to hold risperidone. Now he's significantly somnolent oral hold sedatives. He put nothing by mouth.bc  Protein-calorie malnutrition, severe: Will need and shortening nutrition consult with awake.  Hyperkalemia: Was given Kayexalate now resolved. Continue to hold Lasix.  Continue to monitor potassium and creatinine recheck a basic metabolic panel in the morning.  Chronic combined systolic and diastolic congestive heart failure (HCC) - Seems to be euvolemic and not able to tolerate low-dose due to his somnolence. Continue to hold Lasix.   Normocytic anemia: - Hbg is stable, compare to previous months.  Essential Hypertension: Blood pressure seems to be stable continue to hold them as long as he is somnolent.  CKD (chronic kidney disease), stage III Cr. of baseline.  Continue free water per tube.     DVT Prophylaxis - SCD's  Family Communication: none Disposition Plan: Home 1-2 Code Status:     Code Status Orders        Start     Ordered   05/16/15 2134  Full code   Continuous     05/16/15 2133        IV Access:    Peripheral IV   Procedures and diagnostic studies:   Ct Head Wo Contrast  05/16/2015  CLINICAL DATA:  Progressive supranuclear palsy, fall, facial twitching, upper arm BILATERAL rigidity beginning at noon today, hypertension, coronary artery disease, CHF, former smoker EXAM: CT HEAD WITHOUT CONTRAST TECHNIQUE: Contiguous axial images were obtained from the base of the skull through  the vertex without intravenous contrast. COMPARISON:  None; correlation MR brain 05/30/2013 FINDINGS: Generalized atrophy. Normal ventricular morphology. No midline shift or mass effect. Small old RIGHT basal ganglia lacunar infarct. Mild small vessel chronic ischemic changes of deep cerebral white matter. No intracranial hemorrhage, mass lesion, or evidence acute infarction. No extra-axial fluid collections. Visualized paranasal sinuses and mastoid air cells clear. Extensive atherosclerotic calcifications at the carotid siphons. No acute osseous findings. IMPRESSION: Atrophy with small vessel chronic ischemic changes of deep cerebral white matter. Old RIGHT basal ganglia lacunar infarct. No acute intracranial abnormalities. Electronically Signed   By: Ulyses Southward M.D.   On: 05/16/2015 17:24     Medical Consultants:    None.  Anti-Infectives:   Anti-infectives    None      Subjective:    [name] only responsive to painful stimuli.  Objective:    Filed Vitals:   05/16/15 1854 05/16/15 2045 05/16/15 2105 05/17/15 0517  BP: 149/72 131/76 138/73 133/82  Pulse: 85 81 81 92  Temp:   97.6 F (36.4 C) 97.8 F (36.6 C)  TempSrc:   Oral Oral  Resp: Height:    (1.753 m)   Weight:   54.3 kg (119 lb 11.4 oz)   SpO2: 99% 99% 99% 99%    Intake/Output Summary (Last 24 hours) at 05/17/15 0901 Last data filed at 05/17/15 0600  Gross per 24 hour  Intake 376.67 ml  Output    850 ml  Net -473.33 ml   [employer]  05/16/15 2105  Weight: 54.3 kg (119 lb 11.4 oz)    Exam: Gen:  NAD, cachectic appearing Cardiovascular:  RRR, No M/R/G Chest and lungs:   CTAB Abdomen:  Abdomen is soft nontender nondistended PEG tube in place. Extremities:  Dry skin.   Data Reviewed:    Labs: Basic Metabolic Panel:  Recent Labs Lab 05/11/15 0424 05/16/15 1757 05/17/15 0443  NA 137 140 139  K 5.0 5.5* 4.6  CL 101 103 102  CO2 GLUCOSE 83 89 99  BUN 71* 63*  59*  CREATININE 2.16* 2.41* 2.10*  CALCIUM 8.9 9.2 9.2   GFR Estimated Creatinine Clearance: 24.8 mL/min (by C-G formula based on Cr of 2.1). Liver Function Tests:  Recent Labs Lab 05/16/15 1757  AST 33  ALT 17  ALKPHOS 72  BILITOT 0.6  PROT 7.4  ALBUMIN 3.9   No results for input(s): LIPASE, AMYLASE in the last 168 hours. No results for input(s): AMMONIA in the last 168 hours. Coagulation profile No results for input(s): INR, PROTIME in the last 168 hours.  CBC:  Recent Labs Lab 05/11/15 0424 05/16/15 1757 05/17/15 0443  WBC 7.5 8.2 5.5  NEUTROABS  --  5.3  --   HGB 9.2* 9.1* 10.8*  HCT 29.1* 29.3* 34.8*  MCV 84.6 84.4 86.6  PLT 296 209 207   Cardiac Enzymes:  Recent Labs Lab 05/16/15 1757  CKTOTAL 99   BNP (last 3 results) No results for input(s): PROBNP in the last 8760 hours. CBG:  Recent Labs Lab 05/12/15 2106 05/12/15 2351 05/13/15 0357 05/13/15 0811 05/13/15 1237  GLUCAP 144* 142* 84 137* 82   D-Dimer: No results for input(s): DDIMER in the last 72 hours. Hgb A1c: No results for input(s): HGBA1C in the last 72 hours. Lipid Profile: No results for input(s): CHOL, HDL, LDLCALC, TRIG, CHOLHDL, LDLDIRECT in the last 72 hours. Thyroid function studies: No results for input(s): TSH, T4TOTAL, T3FREE, THYROIDAB in the last 72 hours.  Invalid input(s): FREET3 Anemia work up: No results for input(s): VITAMINB12, FOLATE, FERRITIN, TIBC, IRON, RETICCTPCT in the last 72 hours. Sepsis Labs:  Recent Labs Lab 05/11/15 0424 05/16/15 1757 05/17/15 0443  WBC 7.5 8.2 5.5   Microbiology Recent Results (from the past 240 hour(s))  MRSA PCR Screening     Status: None   Collection Time: 05/08/15  6:42 PM  Result Value Ref Range Status   MRSA by PCR NEGATIVE NEGATIVE Final    Comment:        The GeneXpert MRSA Assay (FDA approved for NASAL specimens only), is one component of a comprehensive MRSA colonization surveillance program. It is  not intended to diagnose MRSA infection nor to guide or monitor treatment for MRSA infections.      Medications:   . allopurinol  100 mg Per Tube Daily  . amLODipine  10 mg Per Tube Daily  . antiseptic oral rinse  7 mL Mouth Rinse q12n4p  . aspirin  81 mg Oral Daily  . atorvastatin  80 mg Per Tube Daily  . carbidopa-levodopa  0.5 tablet Oral 3 times per day  . carvedilol  25 mg Per Tube BID  . chlorhexidine  15 mL Mouth Rinse BID  . colchicine  0.3 mg Per Tube Daily  . feeding supplement (JEVITY 1.2 CAL)  240 mL Per Tube 6 X Daily  . folic acid  1 mg Per Tube Daily  . free water  100 mL Per Tube 5 X Daily  .  hydrALAZINE  100 mg Per Tube Daily  . isosorbide dinitrate  40 mg Per Tube Daily  . levETIRAcetam  500 mg Oral BID  . multivitamin with minerals  1 tablet Per Tube Daily  . pantoprazole sodium  40 mg Per Tube Daily  . sodium chloride  3 mL Intravenous Q12H  . thiamine  100 mg Per Tube Daily  . vitamin B-12  1,000 mcg Per Tube Daily   Continuous Infusions: . sodium chloride 50 mL/hr at 05/16/15 2228    Time spent: 25 min     FELIZ Rosine BeatORTIZ, ABRAHAM  [facility] Pager [phone]  *Please refer to [email or web address], password [email or web address] to get updated schedule on who will round on this patient, as hospitalists switch teams weekly. If 7PM-7AM, please contact night-coverage at [email or web address], password [email or web address] for any overnight needs.  05/17/2015, 9:01 AM

## 2015-05-17 NOTE — Progress Notes (Signed)
Initial Nutrition Assessment  DOCUMENTATION CODES:   Severe malnutrition in context of acute illness/injury, Underweight  INTERVENTION:  - Recommend Jevity 1.5 @ 50 mL/hr with 100 mL free water Q4h to provide 1800 kcal, 76 grams protein, and 1512 mL free water - RD will continue to monitor for needs  NUTRITION DIAGNOSIS:   Inadequate oral intake related to inability to eat as evidenced by NPO status.  GOAL:   Patient will meet greater than or equal to 90% of their needs  MONITOR:   Weight trends, Labs, Skin, I & O's  REASON FOR ASSESSMENT:   Malnutrition Screening Tool  ASSESSMENT:   69 y.o. male with a history of Progressive Supranuclear Palsy, CAD,HTN, CKD and recent hospitalization for GI Bleed and Anemia just discharged 2 days ago who was sent from the [facility]hab [facility] to the ED due to staff reports of facial tremors and tremors. He was administered IV Ativan in the ED X 1 dose , and is unable to give a history at this time. A CT scan of the head was performed and was negative for acute findings, and lab studies revealed AKI with CKD and hyperkalemia. He was referred for further evaluation.   Pt seen for MST. BMI indicates underweight status. Pt mumbles all answers and no family/visitors in the room to provide information. He states soreness to abdomen this AM. Per chart review, pt had PEG placed 04/24/15.  He has been NPO since admission. Pt was recently hospitalized. TF recommendations as outlined above. Will monitor for needs to adjust protein needs given pt with hx of CKD stage 3.  Moderate and severe muscle and moderate fat wasting noted. Per chart review, pt has lost 6 lbs (5% body weight) <1 month which is significant for time frame.  Unable to meet needs at this time. Medications reviewed. Labs reviewed; CBGs: 82-155 mg/dL, BUN/creatinine elevated but trending down, GFR: 35.   Diet Order:  Diet NPO time specified Except for: Sips with Meds  Skin:  Wound (see  comment) (Stage 1 sacral pressure ulcer)  Last BM:  PTA  Height:   Ht Readings from Last 1 Encounters:  05/16/15 5\' 9"  (1.753 m)    Weight:   Wt Readings from Last 1 Encounters:  05/16/15 119 lb 11.4 oz (54.3 kg)    Ideal Body Weight:  72.73 kg (kg)  BMI:  Body mass index is 17.67 kg/(m^2).  Estimated Nutritional Needs:   Kcal:  1725-1975  Protein:  65-75 grams  Fluid:  2-2.2 L/day  EDUCATION NEEDS:   No education needs identified at this time     Trenton GammonJessica Salvador Bigbee, RD, LDN Inpatient Clinical Dietitian Pager # [phone] After hours/weekend pager # [phone]

## 2015-05-17 NOTE — Evaluation (Signed)
Physical Therapy Evaluation Patient Details Name: Martin Cain MRN: [id number] DOB: 1945/07/28 Today's Date: 05/17/2015   History of Present Illness  69 yo male readmitted with tremors of the nervous system related to progressive supranuclear palsy, hx of severe malnutrition. PMH of HTN, HLD, CKD stage 4, CAD, gout, CHF, dysphagia, and ongoing alcohol abuse.  Clinical Impression  Pt admitted with above diagnosis. Pt currently with functional limitations due to the deficits listed below (see PT Problem List).  Pt will benefit from skilled PT to increase their independence and safety with mobility to allow discharge to the venue listed below.   Pt readmitted from SNF.  Pt assisted up to recliner today and sitter into room end of session.  Recommend return to SNF for rehab upon d/c.     Follow Up Recommendations SNF;Supervision for mobility/OOB    Equipment Recommendations  None recommended by PT    Recommendations for Other Services       Precautions / Restrictions Precautions Precautions: Fall Precaution Comments: sitter Restrictions Weight Bearing Restrictions: No      Mobility  Bed Mobility Overal bed mobility: Needs Assistance Bed Mobility: Supine to Sit     Supine to sit: Supervision     General bed mobility comments: Pt requires multiple multi-modal cues and increased time to sit up and scoot to EOB, however, able to perform bed mobility with supervision  Transfers Overall transfer level: Needs assistance Equipment used: Rolling walker (2 wheeled) Transfers: Sit to/from [employer] Sit to Stand: Min assist Stand pivot transfers: Min assist       General transfer comment: Increased time, multiple verbal and tactile cues for taking bigger steps (pt takes mini steps and difficulties initiating movement; parkinsonian gait), staying close to the RW, and turning with the walker. Min assist for steadying and maintaining balance during  standing.  Ambulation/Gait             General Gait Details: Did not ambulate pt today, pt reported being fatigued and was unsteady maintaining balance in standing  Stairs            Wheelchair Mobility    Modified Rankin (Stroke Patients Only)       Balance Overall balance assessment: Needs assistance Sitting-balance support: Bilateral upper extremity supported;Feet supported Sitting balance-Leahy Scale: Poor Sitting balance - Comments: Has to use bil UEs to maintain sitting balance   Standing balance support: Bilateral upper extremity supported;During functional activity Standing balance-Leahy Scale: Poor Standing balance comment: Requires assist from therapist in standing, able to stand holding on to RW without LOB for ~20 seconds                              Pertinent Vitals/Pain Pain Assessment: No/denies pain (Denies dizziness)    Home Living Family/patient expects to be discharged to:: Skilled nursing facility                      Prior Function Level of Independence: Needs assistance   Gait / Transfers Assistance Needed: SNF for rehab     Comments: pt states he was walking in rehab using RW upon last admission     Hand Dominance        Extremity/Trunk Assessment   Upper Extremity Assessment: LUE deficits/detail       LUE Deficits / Details: possible increased tone compared to R UE with passive movement (difficult to tell if truly tone or cognition  related)   Lower Extremity Assessment: LLE deficits/detail (strength at least 4/5)   LLE Deficits / Details: possible increased tone compared to R UE with passive movement (difficult to tell if truly tone or cognition related)  Cervical / Trunk Assessment: Other exceptions  Communication   Communication: Expressive difficulties (expressive aphasia)  Cognition Arousal/Alertness: Awake/alert Behavior During Therapy: WFL for tasks assessed/performed Overall Cognitive Status:  Difficult to assess                      General Comments General comments (skin integrity, edema, etc.): Coordination: pt was able to perform heel on shin slides bilat and rapid alternating hand movement; decreased coordination as speed of movement increased.       Exercises Other Exercises Other Exercises: bilat UEs abduction and flexion x 5 reps, added neck extension with UEs flexion x 5 reps Other Exercises: LEs knee extension is sitting x 5 reps      Assessment/Plan    PT Assessment Patient needs continued PT services  PT Diagnosis Difficulty walking   PT Problem List Decreased activity tolerance;Decreased balance;Decreased mobility;Decreased coordination;Decreased knowledge of use of DME;Decreased safety awareness;Impaired tone  PT Treatment Interventions DME instruction;Gait training;Functional mobility training;Therapeutic activities;Therapeutic exercise;Balance training;Patient/family education;Neuromuscular re-education;Cognitive remediation   PT Goals (Current goals can be found in the Care Plan section) Acute Rehab PT Goals Patient Stated Goal: pt wants to go home PT Goal Formulation: With patient Time For Goal Achievement: 05/31/15 Potential to Achieve Goals: Good    Frequency Min 3X/week   Barriers to discharge        Co-evaluation               End of Session Equipment Utilized During Treatment: Gait belt Activity Tolerance: Patient limited by fatigue Patient left: in chair;with call bell/phone within reach;with nursing/sitter in room;with family/visitor present Nurse Communication: Mobility status    Functional Assessment Tool Used: clinical judgement Functional Limitation: Mobility: Walking and moving around Mobility: Walking and Moving Around Current Status [phone]): At least 20 percent but less than 40 percent impaired, limited or restricted Mobility: Walking and Moving Around Goal Status [phone]([id number]): At least 1 percent but less than 20 percent  impaired, limited or restricted    Time: 1500-1523 PT Time Calculation (min) (ACUTE ONLY): 23 min   Charges:   PT Evaluation $Initial PT Evaluation Tier I: 1 Procedure     PT G Codes:   PT G-Codes **NOT FOR INPATIENT CLASS** Functional Assessment Tool Used: clinical judgement Functional Limitation: Mobility: Walking and moving around Mobility: Walking and Moving Around Current Status ([phone]): At least 20 percent but less than 40 percent impaired, limited or restricted Mobility: Walking and Moving Around Goal Status [phone](G8979): At least 1 percent but less than 20 percent impaired, limited or restricted   Evaluation performed and documentated by Durward MallardAna Steed, SPT. This entire session was guided, instructed, and directly supervised by Tamala SerKatherine Aryel Edelen, PT, DPT.  Zenovia JarredKati Kyrstal Monterrosa, PT, DPT 05/17/2015 Pager: [phone]   Maida SaleLEMYRE,KATHrine E 05/17/2015, 4:28 PM

## 2015-05-17 NOTE — Procedures (Signed)
History: 69 yo M with [id number]  Sedation: None, ativan evenign prior   Technique: This is a 21 channel routine scalp EEG performed at the bedside with bipolar and monopolar montages arranged in accordance to the international 10/20 system of electrode placement. One channel was dedicated to EKG recording.   Background: The background predominantly consists of alpha and beta activities, however there is mild intrusion of slow activity intermixed with the more normal frequencies though this is low amplitude. It is generalized and irregular.  There is a well defined posterior dominant rhythm of 9 Hz that attenuates with eye opening. This increases with drowsiness.   Photic stimulation: Physiologic driving is not performed  EEG Abnormalities: 1) Mild generalized irregular slow activity.   Clinical Interpretation: This EEG is consistent with a mild generalized non-specific cerebral dysfunction(encephalopathy). There was no seizure or seizure predisposition recorded on this study.   Martin SlotMcNeill Khiara Shuping, MD [facility] [phone]  If 7pm- 7am, please page neurology on call as listed in AMION.

## 2015-05-18 ENCOUNTER — Observation Stay (HOSPITAL_BASED_OUTPATIENT_CLINIC_OR_DEPARTMENT_OTHER): Payer: Medicare HMO

## 2015-05-18 DIAGNOSIS — R0600 Dyspnea, unspecified: Secondary | ICD-10-CM | POA: Diagnosis not present

## 2015-05-18 DIAGNOSIS — I509 Heart failure, unspecified: Secondary | ICD-10-CM

## 2015-05-18 DIAGNOSIS — R251 Tremor, unspecified: Secondary | ICD-10-CM | POA: Diagnosis not present

## 2015-05-18 DIAGNOSIS — N183 Chronic kidney disease, stage 3 (moderate): Secondary | ICD-10-CM | POA: Diagnosis not present

## 2015-05-18 DIAGNOSIS — I10 Essential (primary) hypertension: Secondary | ICD-10-CM | POA: Diagnosis not present

## 2015-05-18 DIAGNOSIS — I5042 Chronic combined systolic (congestive) and diastolic (congestive) heart failure: Secondary | ICD-10-CM | POA: Diagnosis not present

## 2015-05-18 LAB — BASIC METABOLIC PANEL
ANION GAP: 7 (ref 5–15)
BUN: 52 mg/dL — AB (ref 6–20)
CO2: 30 mmol/L (ref 22–32)
Calcium: 8.7 mg/dL — ABNORMAL LOW (ref 8.9–10.3)
Chloride: 102 mmol/L (ref 101–111)
Creatinine, Ser: 1.94 mg/dL — ABNORMAL HIGH (ref 0.61–1.24)
GFR, EST AFRICAN AMERICAN: 38 mL/min — AB (ref >60)
GFR, EST NON AFRICAN AMERICAN: 33 mL/min — AB (ref >60)
GLUCOSE: 83 mg/dL (ref 65–99)
Potassium: 4.4 mmol/L (ref 3.5–5.1)
Sodium: 139 mmol/L (ref 135–145)

## 2015-05-18 NOTE — Plan of Care (Signed)
Problem: Education: Goal: Knowledge of  General Education information/materials will improve Outcome: Not Progressing Patient alert to self

## 2015-05-18 NOTE — Progress Notes (Signed)
  Echocardiogram 2D Echocardiogram has been performed.  Leta JunglingCooper, Teniqua Marron M 05/18/2015, 3:11 PM

## 2015-05-18 NOTE — Progress Notes (Signed)
[facility] PROGRESS NOTE    Progress Note   Martin Cain ZOX:[id number] DOB: 05/12/1946 DOA: 05/16/2015 PCP: Pcp Not In System   Brief Narrative:   Martin Cain is an 69 y.o. male   Assessment/Plan:  Tremors of nervous system/  Supranuclear palsies, progressive (HCC)/acute encephalopathy: He is more alert today start tube feedings, his EEG was nonspecific. Neurology recommended to hold Keppra and risperidone due to sedation, which I agree His myoclonus movements could be secondary to his renal failure. No rub on physical exam. Perform a swallowing evaluation and feet accordingly.  Protein-calorie malnutrition, severe: Will need and shortening nutrition consult with awake.  Acute kidney injury on chronic kidney disease stage III /Hyperkalemia: Resolved with Kayexalate. His acute renal failure is improving just by holding his Lasix, on admission his creatinine was 2.1 hours, Monitor strict is and O's. UnSure of his baseline creatinine. He seems euvolemic on physical exam, we'll continue to hold his Lasix and continue enteral hydration.  Chronic combined systolic and diastolic congestive heart failure (HCC) - Seems to be euvolemic,  Continue to hold Lasix.  - Check a 2-D echo. Continue Coreg.  Normocytic anemia: - Hbg is stable, compare to previous months.  Essential Hypertension: Blood pressure seems to be stable continue to hold them as long as he is somnolent.    DVT Prophylaxis - SCD's  Family Communication: none Disposition Plan: Home 1-2 days Code Status:     Code Status Orders        Start     Ordered   05/16/15 2134  Full code   Continuous     05/16/15 2133        IV Access:    Peripheral IV   Procedures and diagnostic studies:   Ct Head Wo Contrast  05/16/2015  CLINICAL DATA:  Progressive supranuclear palsy, fall, facial twitching, upper arm BILATERAL rigidity beginning at noon today, hypertension, coronary artery disease, CHF, former  smoker EXAM: CT HEAD WITHOUT CONTRAST TECHNIQUE: Contiguous axial images were obtained from the base of the skull through the vertex without intravenous contrast. COMPARISON:  None; correlation MR brain 05/30/2013 FINDINGS: Generalized atrophy. Normal ventricular morphology. No midline shift or mass effect. Small old RIGHT basal ganglia lacunar infarct. Mild small vessel chronic ischemic changes of deep cerebral white matter. No intracranial hemorrhage, mass lesion, or evidence acute infarction. No extra-axial fluid collections. Visualized paranasal sinuses and mastoid air cells clear. Extensive atherosclerotic calcifications at the carotid siphons. No acute osseous findings. IMPRESSION: Atrophy with small vessel chronic ischemic changes of deep cerebral white matter. Old RIGHT basal ganglia lacunar infarct. No acute intracranial abnormalities. Electronically Signed   By: Ulyses SouthwardMark  Boles M.D.   On: 05/16/2015 17:24     Medical Consultants:    None.  Anti-Infectives:   Anti-infectives    None      Subjective:    Martin Cain is okay on a conversation he relates he is hungry and feels great.  Objective:    Filed Vitals:   05/17/15 2108 05/18/15 0339 05/18/15 0712 05/18/15 0953  BP: 133/70 130/56  133/78  Pulse: 82 71  83  Temp: 97.5 F (36.4 C) 98.5 F (36.9 C)  97.7 F (36.5 C)  TempSrc: Oral Oral  Oral  Resp: 18 18 18 18   Height:      Weight:  52.209 kg (115 lb 1.6 oz)    SpO2: 100% 100%  98%    Intake/Output Summary (Last 24 hours) at 05/18/15 1222 Last data filed at 05/18/15  1053  Gross per 24 hour  Intake    640 ml  Output   1650 ml  Net  -1010 ml   Filed Weights   05/16/15 2105 05/18/15 0339  Weight: 54.3 kg (119 lb 11.4 oz) 52.209 kg (115 lb 1.6 oz)    Exam: Gen:  NAD, cachectic appearing Cardiovascular:  RRR, No rub Chest and lungs:   CTAB Abdomen:  Abdomen is soft nontender nondistended PEG tube in place. Extremities:  Dry skin.   Data Reviewed:     Labs: Basic Metabolic Panel:  Recent Labs Lab 05/16/15 1757 05/17/15 0443 05/18/15 0524  NA 140 139 139  K 5.5* 4.6 4.4  CL 103 102 102  CO2 GLUCOSE 89 99 83  BUN 63* 59* 52*  CREATININE 2.41* 2.10* 1.94*  CALCIUM 9.2 9.2 8.7*   GFR Estimated Creatinine Clearance: 25.8 mL/min (by C-G formula based on Cr of 1.94). Liver Function Tests:  Recent Labs Lab 05/16/15 1757  AST 33  ALT 17  ALKPHOS 72  BILITOT 0.6  PROT 7.4  ALBUMIN 3.9   No results for input(s): LIPASE, AMYLASE in the last 168 hours. No results for input(s): AMMONIA in the last 168 hours. Coagulation profile No results for input(s): INR, PROTIME in the last 168 hours.  CBC:  Recent Labs Lab 05/16/15 1757 05/17/15 0443  WBC 8.2 5.5  NEUTROABS 5.3  --   HGB 9.1* 10.8*  HCT 29.3* 34.8*  MCV 84.4 86.6  PLT 209 207   Cardiac Enzymes:  Recent Labs Lab 05/16/15 1757  CKTOTAL 99   BNP (last 3 results) No results for input(s): PROBNP in the last 8760 hours. CBG:  Recent Labs Lab 05/12/15 2106 05/12/15 2351 05/13/15 0357 05/13/15 0811 05/13/15 1237  GLUCAP 144* 142* 84 137* 82   D-Dimer: No results for input(s): DDIMER in the last 72 hours. Hgb A1c: No results for input(s): HGBA1C in the last 72 hours. Lipid Profile: No results for input(s): CHOL, HDL, LDLCALC, TRIG, CHOLHDL, LDLDIRECT in the last 72 hours. Thyroid function studies: No results for input(s): TSH, T4TOTAL, T3FREE, THYROIDAB in the last 72 hours.  Invalid input(s): FREET3 Anemia work up: No results for input(s): VITAMINB12, FOLATE, FERRITIN, TIBC, IRON, RETICCTPCT in the last 72 hours. Sepsis Labs:  Recent Labs Lab 05/16/15 1757 05/17/15 0443  WBC 8.2 5.5   Microbiology Recent Results (from the past 240 hour(s))  MRSA PCR Screening     Status: None   Collection Time: 05/08/15  6:42 PM  Result Value Ref Range Status   MRSA by PCR NEGATIVE NEGATIVE Final    Comment:        The GeneXpert MRSA  Assay (FDA approved for NASAL specimens only), is one component of a comprehensive MRSA colonization surveillance program. It is not intended to diagnose MRSA infection nor to guide or monitor treatment for MRSA infections.      Medications:   . allopurinol  100 mg Per Tube Daily  . antiseptic oral rinse  7 mL Mouth Rinse q12n4p  . aspirin  81 mg Oral Daily  . atorvastatin  80 mg Per Tube Daily  . carbidopa-levodopa  0.5 tablet Oral 3 times per day  . carvedilol  25 mg Per Tube BID  . chlorhexidine  15 mL Mouth Rinse BID  . colchicine  0.3 mg Per Tube Daily  . feeding supplement (JEVITY 1.2 CAL)  240 mL Per Tube 6 X Daily  . ferrous sulfate  325 mg  Oral BH-q7a  . folic acid  1 mg Per Tube Daily  . free water  100 mL Per Tube 5 X Daily  . furosemide  40 mg Per Tube Daily  . hydrALAZINE  100 mg Per Tube Daily  . isosorbide dinitrate  40 mg Per Tube Daily  . multivitamin with minerals  1 tablet Per Tube Daily  . pantoprazole sodium  40 mg Per Tube Daily  . sodium chloride  3 mL Intravenous Q12H  . thiamine  100 mg Per Tube Daily  . vitamin B-12  1,000 mcg Per Tube Daily   Continuous Infusions:    Time spent: 25 min     Marinda Elk  [facility] Pager [phone]  *Please refer to [email or web address], password [email or web address] to get updated schedule on who will round on this patient, as hospitalists switch teams weekly. If 7PM-7AM, please contact night-coverage at [email or web address], password [email or web address] for any overnight needs.  05/18/2015, 12:22 PM

## 2015-05-18 NOTE — Progress Notes (Addendum)
Order for swallow evaluation received.  Pt has h/o probable neurological diagnosis resulting in severe pharyngeal dysphagia (diagnosed via FEES 1 1/2 months ago) requiring long term feeding tube placement.  Pt was made npo except ice.    SLP received call from SNF SLP yesterday requesting for pt to have an instrumental evaluation if our team was consulted.   SNF SLP reports pt left hospital (from last admit) on diet of dys2/nectar with known risks.  SNF SLP reported pt tolerated diet well initially but on day of admit, he had coarse lung sounds with AMS and SNF SLP was concerned pt may be aspirating.     SLP spoke to pt via phone and reviewed information with him.  Pt agrees with plan for MBS, also stating "when do I get to eat?"  Reviewed concern for aspiration with neuro dx.  Will order MBS.   Martin Cain Shed Nixon, MS [facility] SLP [phone]

## 2015-05-18 NOTE — NC FL2 (Signed)
[place] MEDICAID FL2 LEVEL OF CARE SCREENING TOOL     IDENTIFICATION  Patient Name: Martin Cain Birthdate: 29-Mar-1946 Sex: male Admission Date (Current Location): 05/16/2015  [facility] and [place] Number: Producer, television/film/video and Address:  [facility],  [address] [place], [place] [address]      Provider Number: [id number]  Attending Physician Name and Address:  Marinda Elk, MD  Relative Name and Phone Number:       Current Level of Care: Hospital Recommended Level of Care: Skilled Nursing Facility Prior Approval Number:    Date Approved/Denied:   PASRR Number:    Discharge Plan: SNF    Current Diagnoses: Patient Active Problem List   Diagnosis Date Noted  . Tremors of nervous system 05/16/2015  . AKI (acute kidney injury) (HCC) 05/16/2015  . Hyperkalemia 05/16/2015  . Chronic combined systolic and diastolic congestive heart failure (HCC) 05/16/2015  . Normocytic anemia 05/16/2015  . Supranuclear palsies, progressive (HCC) 05/16/2015  . Essential hypertension 05/16/2015  . CKD (chronic kidney disease), stage III 05/16/2015  . Progressive supranuclear palsies (HCC)   . Pressure ulcer 05/09/2015  . Protein-calorie malnutrition, severe 05/09/2015  . Anemia 05/08/2015  . Absolute anemia   . Dysphagia   . Palliative care encounter   . Symptomatic anemia 04/17/2015  . Malnutrition of moderate degree (HCC) 06/21/2013  . HTN (hypertension) 06/20/2013  . Acute on chronic combined systolic and diastolic heart failure (HCC) 06/20/2013  . Acute on chronic renal failure (HCC) 05/30/2013  . Acute on chronic combined systolic and diastolic CHF (congestive heart failure) (HCC) 05/28/2013  . Renal insufficiency 05/27/2013  . Acute exacerbation of CHF (congestive heart failure) (HCC) 05/27/2013  . Elevated troponin 05/27/2013  . Gout flare 05/27/2013  . HTN (hypertension), malignant 05/27/2013    Orientation ACTIVITIES/SOCIAL BLADDER RESPIRATION     Self    Continent Normal  BEHAVIORAL SYMPTOMS/MOOD NEUROLOGICAL BOWEL NUTRITION STATUS      Continent    PHYSICIAN VISITS COMMUNICATION OF NEEDS Height & Weight Skin    Verbally  (175.3 cm) 115 lbs. PU Stage and Appropriate Care PU Stage 1 Dressing: Daily        AMBULATORY STATUS RESPIRATION    Assist extensive Normal      Personal Care Assistance Level of Assistance  Bathing, Dressing Bathing Assistance: Limited assistance Feeding assistance: Limited assistance Dressing Assistance: Limited assistance      Functional Limitations Info                SPECIAL CARE FACTORS FREQUENCY  PT (By licensed PT), OT (By licensed OT)     PT Frequency: 5 OT Frequency: 5           Additional Factors Info                  Current Medications (05/18/2015): Current Facility-Administered Medications  Medication Dose Route Frequency Provider Last Rate Last Dose  . acetaminophen (TYLENOL) tablet 650 mg  650 mg Oral Q6H PRN Ron Parker, MD       Or  . acetaminophen (TYLENOL) suppository 650 mg  650 mg Rectal Q6H PRN Ron Parker, MD      . allopurinol (ZYLOPRIM) tablet 100 mg  100 mg Per Tube Daily Ron Parker, MD   100 mg at 05/17/15 0959  . antiseptic oral rinse (CPC / CETYLPYRIDINIUM CHLORIDE 0.05%) solution 7 mL  7 mL Mouth Rinse q12n4p Ron Parker, MD   7 mL at 05/17/15  1600  . aspirin chewable tablet 81 mg  81 mg Oral Daily Ron Parker, MD   81 mg at 05/17/15 1001  . atorvastatin (LIPITOR) tablet 80 mg  80 mg Per Tube Daily Ron Parker, MD   80 mg at 05/17/15 1000  . carbidopa-levodopa (SINEMET IR) 25-100 MG per tablet immediate release 0.5 tablet  0.5 tablet Oral 3 times per day Ron Parker, MD   0.5 tablet at 05/18/15 [phone]  . carvedilol (COREG) tablet 25 mg  25 mg Per Tube BID Ron Parker, MD   25 mg at 05/17/15 2331  . chlorhexidine (PERIDEX) 0.12 % solution 15 mL  15 mL Mouth Rinse BID Ron Parker, MD    15 mL at 05/17/15 1000  . colchicine tablet 0.3 mg  0.3 mg Per Tube Daily Ron Parker, MD   0.3 mg at 05/17/15 1000  . feeding supplement (JEVITY 1.2 CAL) liquid 240 mL  240 mL Per Tube 6 X Daily Marinda Elk, MD   240 mL at 05/18/15 2130  . ferrous sulfate tablet 325 mg  325 mg Oral BH-q7a Marinda Elk, MD      . folic acid (FOLVITE) tablet 1 mg  1 mg Per Tube Daily Ron Parker, MD   1 mg at 05/17/15 1001  . free water 100 mL  100 mL Per Tube 5 X Daily Ron Parker, MD   100 mL at 05/18/15 8657  . furosemide (LASIX) tablet 40 mg  40 mg Per Tube Daily Marinda Elk, MD   40 mg at 05/17/15 1450  . hydrALAZINE (APRESOLINE) tablet 100 mg  100 mg Per Tube Daily Ron Parker, MD   100 mg at 05/17/15 1000  . ipratropium-albuterol (DUONEB) 0.5-2.5 (3) MG/3ML nebulizer solution 3 mL  3 mL Inhalation Q6H PRN Ron Parker, MD      . isosorbide dinitrate (ISORDIL) tablet 40 mg  40 mg Per Tube Daily Ron Parker, MD   40 mg at 05/17/15 1007  . multivitamin with minerals tablet 1 tablet  1 tablet Per Tube Daily Ron Parker, MD   1 tablet at 05/17/15 1451  . nitroGLYCERIN (NITROSTAT) SL tablet 0.4 mg  0.4 mg Sublingual Q5 min PRN Ron Parker, MD      . ondansetron (ZOFRAN) tablet 4 mg  4 mg Oral Q6H PRN Ron Parker, MD       Or  . ondansetron (ZOFRAN) injection 4 mg  4 mg Intravenous Q6H PRN Ron Parker, MD      . pantoprazole sodium (PROTONIX) 40 mg/20 mL oral suspension 40 mg  40 mg Per Tube Daily Ron Parker, MD   40 mg at 05/17/15 1007  . sodium chloride 0.9 % injection 3 mL  3 mL Intravenous Q12H Ron Parker, MD   3 mL at 05/16/15 2310  . thiamine (VITAMIN B-1) tablet 100 mg  100 mg Per Tube Daily Ron Parker, MD   100 mg at 05/17/15 1008  . vitamin B-12 (CYANOCOBALAMIN) tablet 1,000 mcg  1,000 mcg Per Tube Daily Ron Parker, MD   1,000 mcg at 05/17/15 1007   Do not use this list as official  medication orders. Please verify with discharge summary.  Discharge Medications:   Medication List    ASK your doctor about these medications        albuterol 108 (90 BASE) MCG/ACT inhaler  Commonly known as:  PROVENTIL  HFA;VENTOLIN HFA  Inhale 2 puffs into the lungs every 6 (six) hours as needed for wheezing or shortness of breath.     allopurinol 100 MG tablet  Commonly known as:  ZYLOPRIM  Place 1 tablet (100 mg total) into feeding tube daily.     amLODipine 10 MG tablet  Commonly known as:  NORVASC  Place 1 tablet (10 mg total) into feeding tube daily.     atorvastatin 80 MG tablet  Commonly known as:  LIPITOR  Place 1 tablet (80 mg total) into feeding tube daily.     carbidopa-levodopa 25-100 MG tablet  Commonly known as:  SINEMET  Take 0.5 tablets by mouth 3 (three) times daily.     carvedilol 25 MG tablet  Commonly known as:  COREG  Place 1 tablet (25 mg total) into feeding tube 2 (two) times daily.     colchicine 0.6 MG tablet  Place 0.6 mg into feeding tube daily.     [employer] ASPIRIN LOW DOSE 81 MG EC tablet  Generic drug:  aspirin  Take 81 mg by mouth daily.     feeding supplement (JEVITY 1.5 CAL/FIBER) Liqd  Place 240 mLs into feeding tube 3 (three) times daily.     ferrous sulfate 325 (65 FE) MG tablet  Place 325 mg into feeding tube every morning.     Fluticasone-Salmeterol 250-50 MCG/DOSE Aepb  Commonly known as:  ADVAIR  Inhale 1 puff into the lungs 2 (two) times daily.     folic acid 1 MG tablet  Commonly known as:  FOLVITE  Place 1 mg into feeding tube daily.     free water Soln  Place 100 mLs into feeding tube 5 (five) times daily.     furosemide 40 MG tablet  Commonly known as:  LASIX  Place 1 tablet (40 mg total) into feeding tube daily.     hydrALAZINE 100 MG tablet  Commonly known as:  APRESOLINE  Place 1 tablet (100 mg total) into feeding tube every morning.     hydrocortisone 2.5 % rectal cream  Commonly known as:  ANUSOL-HC  Place  rectally 4 (four) times daily.     ipratropium-albuterol 0.5-2.5 (3) MG/3ML Soln  Commonly known as:  DUONEB  Inhale 3 mLs into the lungs every 6 (six) hours as needed (shortness of breath).     isosorbide dinitrate 40 MG tablet  Commonly known as:  ISORDIL  Place 40 mg into feeding tube daily.     multivitamin with minerals Tabs tablet  Take 1 tablet by mouth daily.     nitroGLYCERIN 0.4 MG SL tablet  Commonly known as:  NITROSTAT  Place 0.4 mg under the tongue every 5 (five) minutes as needed for chest pain.     PROTONIX 40 mg/20 mL Pack  Generic drug:  pantoprazole sodium  Place 40 mg into feeding tube daily. In 30 ml of applesauce.     risperiDONE 0.5 MG disintegrating tablet  Commonly known as:  RISPERDAL M-TABS  0.5 mg by PEG Tube route daily.     risperiDONE 1 MG disintegrating tablet  Commonly known as:  RISPERDAL M-TABS  Take 1 tablet (1 mg total) by mouth every evening.     thiamine 100 MG tablet  Take 1 tablet (100 mg total) by mouth daily.     VITAMIN B-12 PO  Place 1,000 mg into feeding tube daily.     VITAMIN D PO  Place 1,000 mg into feeding tube daily.  Relevant Imaging Results:  Relevant Lab Results:  Recent Labs    Additional Information    Liliana ClineCaldwell, Damarcus Reggio Parks, LCSW

## 2015-05-19 ENCOUNTER — Observation Stay (HOSPITAL_COMMUNITY): Payer: Medicare HMO

## 2015-05-19 DIAGNOSIS — R251 Tremor, unspecified: Principal | ICD-10-CM

## 2015-05-19 DIAGNOSIS — I5042 Chronic combined systolic (congestive) and diastolic (congestive) heart failure: Secondary | ICD-10-CM | POA: Diagnosis not present

## 2015-05-19 DIAGNOSIS — N183 Chronic kidney disease, stage 3 (moderate): Secondary | ICD-10-CM | POA: Diagnosis not present

## 2015-05-19 DIAGNOSIS — N179 Acute kidney failure, unspecified: Secondary | ICD-10-CM | POA: Diagnosis not present

## 2015-05-19 LAB — BASIC METABOLIC PANEL
Anion gap: 9 (ref 5–15)
BUN: 55 mg/dL — AB (ref 6–20)
CALCIUM: 8.8 mg/dL — AB (ref 8.9–10.3)
CO2: 29 mmol/L (ref 22–32)
CREATININE: 2.02 mg/dL — AB (ref 0.61–1.24)
Chloride: 103 mmol/L (ref 101–111)
GFR calc non Af Amer: 31 mL/min — ABNORMAL LOW (ref >60)
GFR, EST AFRICAN AMERICAN: 36 mL/min — AB (ref >60)
GLUCOSE: 85 mg/dL (ref 65–99)
Potassium: 4.5 mmol/L (ref 3.5–5.1)
Sodium: 141 mmol/L (ref 135–145)

## 2015-05-19 MED ORDER — JEVITY 1.2 CAL PO LIQD
237.0000 mL | Freq: Every day | ORAL | Status: DC
  Administered 2015-05-19: 237 mL
  Filled 2015-05-19 (×3): qty 237

## 2015-05-19 MED ORDER — JEVITY 1.5 CAL/FIBER PO LIQD: 240.0000 mL | Freq: Every day | ORAL | Status: AC

## 2015-05-19 MED ORDER — ACETAMINOPHEN 325 MG PO TABS: 650.0000 mg | ORAL_TABLET | Freq: Four times a day (QID) | ORAL | Status: AC | PRN

## 2015-05-19 NOTE — Progress Notes (Signed)
CSW assisting with d/c planning. Pt ready to return to [facility] today. Director CSW approved 5 day LOG while waiting for [employer] medicare authorization. [facility] transport required. Medical necessity form completed. D/C summary sent to SNF for review prior to d/c. Scripts included in d/c packet. Daughter, [name], notified of d/c and is in agreement with d/c plan.  Cori RazorJamie Ladell Bey LCSW [phone]

## 2015-05-19 NOTE — Progress Notes (Signed)
MBSS complete. Full report located under chart review in imaging section.  Jazmin Ley MA, CCC-SLP [phone]   

## 2015-05-19 NOTE — Discharge Summary (Signed)
Physician Discharge Summary  Martin Cain WUJ:[id number] DOB: Jun 10, 1946 DOA: 05/16/2015  PCP: Pcp Not In System  Admit date: 05/16/2015 Discharge date: 05/19/2015  Time spent: 35 minutes  Recommendations for Outpatient Follow-up:  Monitor volume status, resume lasix as needed.  Follow up with cardiology   Discharge Diagnoses:    Tremors of nervous system   Protein-calorie malnutrition, severe   AKI (acute kidney injury) (HCC)   Hyperkalemia   Chronic combined systolic and diastolic congestive heart failure (HCC)   Normocytic anemia   Supranuclear palsies, progressive (HCC)   Essential hypertension   CKD (chronic kidney disease), stage III   Discharge Condition: stable.   Diet recommendation: Will depends on modified barium results.   Filed Weights   05/18/15 0339 05/19/15 0552 05/19/15 0816  Weight: 52.209 kg (115 lb 1.6 oz) 52 kg (114 lb 10.2 oz) 59.2 kg (130 lb 8.2 oz)    History of present illness:  Martin Cain is a 69 y.o. male with a history of Progressive Supranuclear Palsy, CAD,HTN, CKD and recent hospitalization for GI Bleed and Anemia just discharged 2 days ago who was [facility] to the ED due to staff reports of facial tremors and tremors. He was administered IV Ativan in the ED X 1 dose , and is unable to give a history at this time. A CT scan of the head was performed and was negative for acute findings, and lab studies revealed AKI with CKD and hyperkalemia. He was referred for further evaluation.   Hospital Course:   Assessment/Plan:  Tremors of nervous system/ Supranuclear palsies, progressive (HCC)/acute encephalopathy: He is more alert today start tube feedings, his EEG was nonspecific. Neurology recommended to hold Keppra and risperidone due to sedation, which I agree Per neurology if patient develops further episode could consider resuming keppra or depakote/   Dysphagia;  For Barium swallow today, diet depending on  results.   Protein-calorie malnutrition, severe: Resume tube feeding.   Acute kidney injury on chronic kidney disease stage III /Hyperkalemia: Resolved with Kayexalate. His acute renal failure is improving just by holding his Lasix, on admission his creatinine was 2.1 hours, Monitor strict is and O's. UnSure of his baseline creatinine. He seems euvolemic on physical exam, we'll continue to hold his Lasix and continue enteral hydration. Hold lasix at discharge. Monitor volume status, resume lasix as needed.  Cr stable at 2.   Chronic combined systolic and diastolic congestive heart failure (HCC) - Seems to be euvolemic, Continue to hold Lasix.  -  2-D echo with Ef 35 %. Unable to start ACE due to renal failure. . Continue Coreg. -follow up with cardiology outpatient.   Normocytic anemia: - Hbg is stable, compare to previous months.  Essential Hypertension: On Imdur, hydralazine.    DVT Prophylaxis - SCD's       Procedures:  EEG; This EEG is consistent with a mild generalized non-specific cerebral dysfunction(encephalopathy). There was no seizure or seizure predisposition recorded on this study.   Consultations:  neurology  Discharge Exam: Filed Vitals:   05/19/15 0552  BP: 132/51  Pulse: 72  Temp: 98.2 F (36.8 C)  Resp: 16    General: Alert in no distress.  Cardiovascular: S 1, S 2 RRR Respiratory: CTA  Discharge Instructions   Discharge Instructions    Increase activity slowly    Complete by:  As directed           Current Discharge Medication List    START taking these medications  Details  acetaminophen (TYLENOL) 325 MG tablet Take 2 tablets (650 mg total) by mouth every 6 (six) hours as needed for mild pain (or Fever >/= 101). Qty: 30 tablet, Refills: 0      CONTINUE these medications which have NOT CHANGED   Details  allopurinol (ZYLOPRIM) 100 MG tablet Place 1 tablet (100 mg total) into feeding tube daily. Refills: 1    amLODipine  (NORVASC) 10 MG tablet Place 1 tablet (10 mg total) into feeding tube daily. Qty: 30 tablet, Refills: 3    atorvastatin (LIPITOR) 80 MG tablet Place 1 tablet (80 mg total) into feeding tube daily. Refills: 1    carbidopa-levodopa (SINEMET) 25-100 MG tablet Take 0.5 tablets by mouth 3 (three) times daily. Qty: 90 tablet, Refills: 1    carvedilol (COREG) 25 MG tablet Place 1 tablet (25 mg total) into feeding tube 2 (two) times daily. Qty: 60 tablet, Refills: 6    Cholecalciferol (VITAMIN D PO) Place 1,000 mg into feeding tube daily.     colchicine 0.6 MG tablet Place 0.6 mg into feeding tube daily.     [employer] ASPIRIN LOW DOSE 81 MG EC tablet Take 81 mg by mouth daily. Refills: 0    Cyanocobalamin (VITAMIN B-12 PO) Place 1,000 mg into feeding tube daily.     ferrous sulfate 325 (65 FE) MG tablet Place 325 mg into feeding tube every morning.  Refills: 3    Fluticasone-Salmeterol (ADVAIR) 250-50 MCG/DOSE AEPB Inhale 1 puff into the lungs 2 (two) times daily.    folic acid (FOLVITE) 1 MG tablet Place 1 mg into feeding tube daily.  Refills: 3    hydrALAZINE (APRESOLINE) 100 MG tablet Place 1 tablet (100 mg total) into feeding tube every morning. Refills: 3    hydrocortisone (ANUSOL-HC) 2.5 % rectal cream Place rectally 4 (four) times daily. Qty: 30 g, Refills: 0    isosorbide dinitrate (ISORDIL) 40 MG tablet Place 40 mg into feeding tube daily.    Multiple Vitamin (MULTIVITAMIN WITH MINERALS) TABS tablet Take 1 tablet by mouth daily.    Nutritional Supplements (FEEDING SUPPLEMENT, JEVITY 1.5 CAL/FIBER,) LIQD Place 240 mLs into feeding tube 3 (three) times daily.    pantoprazole sodium (PROTONIX) 40 mg/20 mL PACK Place 40 mg into feeding tube daily. In 30 ml of applesauce.    thiamine 100 MG tablet Take 1 tablet (100 mg total) by mouth daily.    Water For Irrigation, Sterile (FREE WATER) SOLN Place 100 mLs into feeding tube 5 (five) times daily.    albuterol (PROVENTIL  HFA;VENTOLIN HFA) 108 (90 BASE) MCG/ACT inhaler Inhale 2 puffs into the lungs every 6 (six) hours as needed for wheezing or shortness of breath. Qty: 1 Inhaler, Refills: 3    ipratropium-albuterol (DUONEB) 0.5-2.5 (3) MG/3ML SOLN Inhale 3 mLs into the lungs every 6 (six) hours as needed (shortness of breath).  Refills: 1    nitroGLYCERIN (NITROSTAT) 0.4 MG SL tablet Place 0.4 mg under the tongue every 5 (five) minutes as needed for chest pain.      STOP taking these medications     furosemide (LASIX) 40 MG tablet      risperiDONE (RISPERDAL M-TABS) 0.5 MG disintegrating tablet      risperiDONE (RISPERDAL M-TABS) 1 MG disintegrating tablet        No Known Allergies    The results of significant diagnostics from this hospitalization (including imaging, microbiology, ancillary and laboratory) are listed below for reference.    Significant Diagnostic Studies: Ct  Abdomen Wo Contrast  04/20/2015  CLINICAL DATA:  Evaluate for percutaneous gastrostomy tube placement. EXAM: CT ABDOMEN WITHOUT CONTRAST TECHNIQUE: Multidetector CT imaging of the abdomen was performed following the standard protocol without IV contrast. COMPARISON:  None. FINDINGS: Lower chest: The heart is markedly enlarged. No pericardial effusion. Coronary artery calcifications are noted. Advanced aortic calcifications without focal aneurysm. The distal esophagus is grossly normal. There are small bilateral pleural effusions and overlying atelectasis. Hepatobiliary: No obvious hepatic lesions or intrahepatic biliary dilatation. The gallbladder is grossly normal. No common bile duct dilatation. Pancreas: No mass, inflammation or ductal dilatation. Spleen: Small spleen.  No focal lesions. Adrenals/Urinary Tract: Numerous bilateral renal cysts and extensive renal artery calcifications. No hydronephrosis. Stomach/Bowel: The stomach, duodenum, visualized small bowel and visualized colon are grossly normal without oral contrast. There  is no interposing of the transverse colon between the anterior wall of the stomach and the anterior abdominal wall. The transverse colon is just below the stomach. Vascular/Lymphatic: Advanced atherosclerotic calcifications involving the aorta and branch vessels. Maximum aortic dimension is 29.5 mm. No obvious mesenteric or retroperitoneal mass or adenopathy. Scattered lymph nodes are noted. Other: No ascites or abdominal wall hernia Musculoskeletal: No significant osseous findings. IMPRESSION: 1. Small bilateral pleural effusions and overlying atelectasis. 2. Cardiac enlargement. 3. Advanced atherosclerotic calcifications involving the aorta and branch vessels. 4. Numerous bilateral renal cysts. 5. The transverse colon is not interposed between the anterior wall of the stomach and the anterior abdominal wall. Electronically Signed   By: Rudie Meyer M.D.   On: 04/20/2015 11:25   Ct Head Wo Contrast  05/16/2015  CLINICAL DATA:  Progressive supranuclear palsy, fall, facial twitching, upper arm BILATERAL rigidity beginning at noon today, hypertension, coronary artery disease, CHF, former smoker EXAM: CT HEAD WITHOUT CONTRAST TECHNIQUE: Contiguous axial images were obtained from the base of the skull through the vertex without intravenous contrast. COMPARISON:  None; correlation MR brain 05/30/2013 FINDINGS: Generalized atrophy. Normal ventricular morphology. No midline shift or mass effect. Small old RIGHT basal ganglia lacunar infarct. Mild small vessel chronic ischemic changes of deep cerebral white matter. No intracranial hemorrhage, mass lesion, or evidence acute infarction. No extra-axial fluid collections. Visualized paranasal sinuses and mastoid air cells clear. Extensive atherosclerotic calcifications at the carotid siphons. No acute osseous findings. IMPRESSION: Atrophy with small vessel chronic ischemic changes of deep cerebral white matter. Old RIGHT basal ganglia lacunar infarct. No acute intracranial  abnormalities. Electronically Signed   By: Ulyses Southward M.D.   On: 05/16/2015 17:24   Ct Pelvis Wo Contrast  05/09/2015  CLINICAL DATA:  Heme-positive stools. Weight loss, failure to thrive. EXAM: CT PELVIS WITHOUT CONTRAST TECHNIQUE: Multidetector CT imaging of the pelvis was performed following the standard protocol without intravenous contrast. COMPARISON:  CT abdomen 04/20/2015. FINDINGS: Low-attenuation lesions in the right kidney measure up to approximately 1.6 cm, difficult to characterize without post-contrast imaging. Visualized portions of the small bowel and colon are grossly unremarkable. Difficult to exclude eccentric rectal wall thickening (series 2, image 45). Prostate is normal in size. Bladder is grossly unremarkable. Atherosclerotic calcification of the arterial vasculature. Infrarenal aorta measures up to 2.9 cm. No free fluid. No definite pathologically enlarged lymph nodes. No worrisome lytic or sclerotic lesions. Remote sacral and left superior pubic rami fractures. IMPRESSION: 1. Difficult to exclude eccentric rectal wall thickening. No additional findings to explain the patient's history. 2. Mildly ectatic infrarenal aorta. Electronically Signed   By: Leanna Battles M.D.   On: 05/09/2015 16:30  Ir Gastrostomy Tube Mod Sed  04/23/2015  CLINICAL DATA:  69 year old male with a history of dysphagia. He presents for gastrostomy tube placement. EXAM: PERCUTANEOUS GASTROSTOMY FLUOROSCOPY TIME:  1 minutes 42 seconds MEDICATIONS AND MEDICAL HISTORY: Versed 1.0 mg, Fentanyl 50 mcg. ANESTHESIA/SEDATION: Moderate sedation time: 11 minutes CONTRAST:  10 cc through the tube PROCEDURE: The procedure, risks, benefits, and alternatives were explained to the patient and the patient's family. Questions regarding the procedure were encouraged and answered. The patient understands and consents to the procedure. The epigastrium was prepped with Betadine in a sterile fashion, and a sterile drape was applied  covering the operative field. A sterile gown and sterile gloves were used for the procedure. A 5-French orogastric tube is placed under fluoroscopic guidance. Scout imaging of the abdomen confirms barium within the transverse colon. The stomach was distended with gas. Under fluoroscopic guidance, an 18 gauge needle was utilized to puncture the anterior wall of the body of the stomach. An Amplatz wire was advanced through the needle passing a T fastener into the lumen of the stomach. The T fastener was secured for gastropexy. A 9-French sheath was inserted. A snare was advanced through the 9-French sheath. A Teena Dunk was advanced through the orogastric tube. It was snared then pulled out the oral cavity, pulling the snare, as well. The leading edge of the gastrostomy was attached to the snare. It was then pulled down the esophagus and out the percutaneous site. It was secured in place. Contrast was injected. Patient tolerated the procedure well and remained hemodynamically stable throughout. No complications encountered and no significant blood loss encountered. FINDINGS: The image demonstrates placement of a 20-French pull-through type gastrostomy tube into the body of the stomach. IMPRESSION: Status post image guided percutaneous gastrostomy tube. Signed, Yvone Neu. Loreta Ave, DO [facility] Electronically Signed   By: Gilmer Mor D.O.   On: 04/23/2015 11:13    Microbiology: No results found for this or any previous visit (from the past 240 hour(s)).   Labs: Basic Metabolic Panel:  Recent Labs Lab 05/16/15 1757 05/17/15 0443 05/18/15 0524 05/19/15 0502  NA 140 139 139 141  K 5.5* 4.6 4.4 4.5  CL 103 102 102 103  CO2 GLUCOSE 89 99 83 85  BUN 63* 59* 52* 55*  CREATININE 2.41* 2.10* 1.94* 2.02*  CALCIUM 9.2 9.2 8.7* 8.8*   Liver Function Tests:  Recent Labs Lab 05/16/15 1757  AST 33  ALT 17  ALKPHOS 72  BILITOT 0.6   PROT 7.4  ALBUMIN 3.9   No results for input(s): LIPASE, AMYLASE in the last 168 hours. No results for input(s): AMMONIA in the last 168 hours. CBC:  Recent Labs Lab 05/16/15 1757 05/17/15 0443  WBC 8.2 5.5  NEUTROABS 5.3  --   HGB 9.1* 10.8*  HCT 29.3* 34.8*  MCV 84.4 86.6  PLT 209 207   Cardiac Enzymes:  Recent Labs Lab 05/16/15 1757  CKTOTAL 99   BNP: BNP (last 3 results)  Recent Labs  04/17/15 2216  BNP 2795.6*    ProBNP (last 3 results) No results for input(s): PROBNP in the last 8760 hours.  CBG:  Recent Labs Lab 05/12/15 2106 05/12/15 2351 05/13/15 0357 05/13/15 0811 05/13/15 1237  GLUCAP 144* 142* 84 137* 82       Signed:  Trinty Marken A  [facility] 05/19/2015, 12:24 PM

## 2015-05-21 ENCOUNTER — Ambulatory Visit (HOSPITAL_COMMUNITY): Payer: Medicare HMO

## 2015-05-21 ENCOUNTER — Encounter (HOSPITAL_COMMUNITY): Payer: Self-pay | Admitting: Radiology

## 2015-05-21 NOTE — Progress Notes (Signed)
[facility]. Appointment in the office was scheduled prior to study done at the hospital. Patient's outpatient echo appointment will be cancelled for today. Dr Delton SeeNelson was notified. Aida [place], Stephenia Vogan

## 2015-05-22 ENCOUNTER — Telehealth: Payer: Self-pay | Admitting: *Deleted

## 2015-05-22 NOTE — Telephone Encounter (Signed)
Spoke with patient's daughter and asked if she was still going to set up the MRI.  The order is in epic but he just needs an appointment.  [facility] # given to her.

## 2015-06-13 ENCOUNTER — Inpatient Hospital Stay: Admission: RE | Admit: 2015-06-13 | Payer: Medicare HMO | Source: Ambulatory Visit

## 2015-06-14 ENCOUNTER — Encounter: Payer: Self-pay | Admitting: Internal Medicine

## 2015-06-14 ENCOUNTER — Ambulatory Visit (INDEPENDENT_AMBULATORY_CARE_PROVIDER_SITE_OTHER): Payer: Medicare HMO | Admitting: Internal Medicine

## 2015-06-14 VITALS — BP 140/74 | HR 74 | Temp 97.5°F | Resp 20 | Wt 126.0 lb

## 2015-06-14 DIAGNOSIS — H269 Unspecified cataract: Secondary | ICD-10-CM | POA: Diagnosis not present

## 2015-06-14 DIAGNOSIS — Z23 Encounter for immunization: Secondary | ICD-10-CM

## 2015-06-14 DIAGNOSIS — N183 Chronic kidney disease, stage 3 unspecified: Secondary | ICD-10-CM

## 2015-06-14 DIAGNOSIS — G231 Progressive supranuclear ophthalmoplegia [Steele-Richardson-Olszewski]: Secondary | ICD-10-CM

## 2015-06-14 DIAGNOSIS — J449 Chronic obstructive pulmonary disease, unspecified: Secondary | ICD-10-CM | POA: Insufficient documentation

## 2015-06-14 DIAGNOSIS — I10 Essential (primary) hypertension: Secondary | ICD-10-CM | POA: Diagnosis not present

## 2015-06-14 DIAGNOSIS — I2510 Atherosclerotic heart disease of native coronary artery without angina pectoris: Secondary | ICD-10-CM

## 2015-06-14 NOTE — Patient Instructions (Signed)
  We have reviewed your prior records including labs and tests today.  All other Health Maintenance issues reviewed.   All recommended immunizations and age-appropriate screenings are up-to-date.  Flu vaccine given administered today.   Medications reviewed and updated.   No changes recommended at this time.  A referral was [facility]  Please schedule followup in 2 months

## 2015-06-14 NOTE — Assessment & Plan Note (Signed)
Has rhonchi on exam today, but did not receive any of his inhalers of breathing treatments - he will have them as soon as he gets back to rehab Him and his daughter deny acute symptoms O2 sat is good Monitor for now

## 2015-06-14 NOTE — Assessment & Plan Note (Signed)
Asymptomatic Following with cardiology Continue current meds 

## 2015-06-14 NOTE — Progress Notes (Signed)
Subjective:    Patient ID: Martin Cain, male    DOB: [birth year], 69 y.o.   MRN: [id number]  HPI He is here to establish with a new pcp.  He is currently in rehab and will be discharged next week.  He will go to live with his daughter.  He is getting stronger.  He is doing speech therapy and physical therapy.  When he goes home he will need those as well as a VNS and HHA.  He does aspirate and is currently eating soft foods. He has a PEG tube in place but is not using it now.   CAD, s/p CABG, htn, hyperlipidemia, CHF (diastolic and systolic):  He is taking all of his medications as prescribed.    Supranuclear palsies: he has seen neurology.  He has had progressive difficulty walking over the past two years and has been using a walker.  He has had many falls.  Earlier this year he started having difficulty speaking and had dysphagia.  He had a 40 lb weight loss in the past year that was unintentional.  A PEG tube was placed during his recent hospitalization.  He is following with neurology.   CKD:  He has chronic kidney disease, which has been stable since being here.    Anemia, chronic:  No known cause (EGD, colonoscopy, capsule endoscopy all done).  He needs a referral for ophthalmology to evaluate his cataracts.  He also needs a handicap parking permit.    Medications and allergies reviewed with patient and updated if appropriate.  Patient Active Problem List   Diagnosis Date Noted  . Tremors of nervous system 05/16/2015  . AKI (acute kidney injury) (HCC) 05/16/2015  . Hyperkalemia 05/16/2015  . Chronic combined systolic and diastolic congestive heart failure (HCC) 05/16/2015  . Normocytic anemia 05/16/2015  . Supranuclear palsies, progressive (HCC) 05/16/2015  . Essential hypertension 05/16/2015  . CKD (chronic kidney disease), stage III 05/16/2015  . Progressive supranuclear palsies (HCC)   . Pressure ulcer 05/09/2015  . Protein-calorie malnutrition, severe 05/09/2015  .  Anemia 05/08/2015  . Absolute anemia   . Dysphagia   . Palliative care encounter   . Symptomatic anemia 04/17/2015  . Malnutrition of moderate degree (HCC) 06/21/2013  . HTN (hypertension) 06/20/2013  . Acute on chronic combined systolic and diastolic heart failure (HCC) 06/20/2013  . Acute on chronic renal failure (HCC) 05/30/2013  . Acute on chronic combined systolic and diastolic CHF (congestive heart failure) (HCC) 05/28/2013  . Renal insufficiency 05/27/2013  . Acute exacerbation of CHF (congestive heart failure) (HCC) 05/27/2013  . Elevated troponin 05/27/2013  . Gout flare 05/27/2013  . HTN (hypertension), malignant 05/27/2013    Current Outpatient Prescriptions on File Prior to Visit  Medication Sig Dispense Refill  . acetaminophen (TYLENOL) 325 MG tablet Take 2 tablets (650 mg total) by mouth every 6 (six) hours as needed for mild pain (or Fever >/= 101). 30 tablet 0  . albuterol (PROVENTIL HFA;VENTOLIN HFA) 108 (90 BASE) MCG/ACT inhaler Inhale 2 puffs into the lungs every 6 (six) hours as needed for wheezing or shortness of breath. 1 Inhaler 3  . allopurinol (ZYLOPRIM) 100 MG tablet Place 1 tablet (100 mg total) into feeding tube daily.  1  . amLODipine (NORVASC) 10 MG tablet Place 1 tablet (10 mg total) into feeding tube daily. 30 tablet 3  . atorvastatin (LIPITOR) 80 MG tablet Place 1 tablet (80 mg total) into feeding tube daily.  1  . carbidopa-levodopa (SINEMET)  25-100 MG tablet Take 0.5 tablets by mouth 3 (three) times daily. 90 tablet 1  . carvedilol (COREG) 25 MG tablet Place 1 tablet (25 mg total) into feeding tube 2 (two) times daily. 60 tablet 6  . Cholecalciferol (VITAMIN D PO) Place 1,000 mg into feeding tube daily.     . colchicine 0.6 MG tablet Place 0.6 mg into feeding tube daily.     . CVS ASPIRIN LOW DOSE 81 MG EC tablet Take 81 mg by mouth daily.  0  . Cyanocobalamin (VITAMIN B-12 PO) Place 1,000 mg into feeding tube daily.     . ferrous sulfate 325 (65 FE)  MG tablet Place 325 mg into feeding tube every morning.   3  . Fluticasone-Salmeterol (ADVAIR) 250-50 MCG/DOSE AEPB Inhale 1 puff into the lungs 2 (two) times daily.    . folic acid (FOLVITE) 1 MG tablet Place 1 mg into feeding tube daily.   3  . hydrALAZINE (APRESOLINE) 100 MG tablet Place 1 tablet (100 mg total) into feeding tube every morning.  3  . hydrocortisone (ANUSOL-HC) 2.5 % rectal cream Place rectally 4 (four) times daily. 30 g 0  . ipratropium-albuterol (DUONEB) 0.5-2.5 (3) MG/3ML SOLN Inhale 3 mLs into the lungs every 6 (six) hours as needed (shortness of breath).   1  . isosorbide dinitrate (ISORDIL) 40 MG tablet Place 40 mg into feeding tube daily.    . Multiple Vitamin (MULTIVITAMIN WITH MINERALS) TABS tablet Take 1 tablet by mouth daily. (Patient taking differently: Place 1 tablet into feeding tube daily. )    . nitroGLYCERIN (NITROSTAT) 0.4 MG SL tablet Place 0.4 mg under the tongue every 5 (five) minutes as needed for chest pain.    . Nutritional Supplements (FEEDING SUPPLEMENT, JEVITY 1.5 CAL/FIBER,) LIQD Place 240 mLs into feeding tube 6 (six) times daily. 350 mL 0  . pantoprazole sodium (PROTONIX) 40 mg/20 mL PACK Place 40 mg into feeding tube daily. In 30 ml of applesauce.    . thiamine 100 MG tablet Take 1 tablet (100 mg total) by mouth daily. (Patient taking differently: Place 100 mg into feeding tube daily. )    . Water For Irrigation, Sterile (FREE WATER) SOLN Place 100 mLs into feeding tube 5 (five) times daily.     No current facility-administered medications on file prior to visit.    Past Medical History  Diagnosis Date  . Hypertension   . Hyperlipidemia   . CKD (chronic kidney disease)     a. Probable stage III.  Marland Kitchen. CAD (coronary artery disease)     a. CABG 2000 [place]. b. NSTEMI 04/2013 felt due to malignant hypertension (was instructed to f/u [facility] for stress test).  . Gout   . CHF (congestive heart failure) (HCC)     a. Echo 04/2013: EF 45-50%, mild AS,  mild biatrial enlargement, sev LVH.  . Osteoarthritis     R knee  . Anemia   . Progressive supranuclear palsies [facility](HCC)     Past Surgical History  Procedure Laterality Date  . Cardiac surgery      2000; [place]  . Appendectomy    . Abdominal exploration surgery    . Tonsillectomy    . Flexible sigmoidoscopy Left 05/11/2015    Procedure: FLEXIBLE SIGMOIDOSCOPY;  Surgeon: Willis ModenaWilliam Outlaw, MD;  Location: [facility];  Service: Endoscopy;  Laterality: Left;    Social History   Social History  . Marital Status: Divorced    Spouse Name: N/A  . Number of  Children: 2  . Years of Education: N/A   Social History Main Topics  . Smoking status: Former Smoker -- 0.50 packs/day for 40 years    Types: Cigarettes  . Smokeless tobacco: Never Used  . Alcohol Use: 0.0 oz/week    0 Standard drinks or equivalent per week     Comment: previously admitted to 1 pint per day. denies use for 1 month.  . Drug Use: No  . Sexual Activity: Not Asked   Other Topics Concern  . None   Social History Narrative   Currently lives at [facility].     Retired from being an Lexicographer.    Review of Systems  Constitutional: Negative for fever and chills.  HENT: Positive for trouble swallowing.   Respiratory: Positive for cough. Negative for shortness of breath and wheezing.   Cardiovascular: Positive for chest pain (only with coughing). Negative for palpitations and leg swelling.  Gastrointestinal: Negative for nausea, vomiting, abdominal pain, diarrhea, constipation and blood in stool.       No GERD  Genitourinary: Negative for dysuria, hematuria and difficulty urinating.  Musculoskeletal: Positive for arthralgias (right knee arthritis, severe).  Neurological: Positive for dizziness (occasional), tremors, speech difficulty and weakness (generalized, no focal). Negative for light-headedness, numbness and headaches.  Psychiatric/Behavioral: Negative for sleep disturbance and  dysphoric mood. The patient is not nervous/anxious.        Objective:   Filed Vitals:   06/14/15 0957  BP: 140/74  Pulse: 74  Temp: 97.5 F (36.4 C)  Resp: 20   Filed Weights   06/14/15 0957  Weight: 126 lb (57.153 kg)   Body mass index is 18.6 kg/(m^2).   Physical Exam  Constitutional: He is oriented to person, place, and time.  Frail, thin male sitting on his walker  HENT:  Head: Normocephalic and atraumatic.  Neck: Neck supple. No JVD present. No tracheal deviation present. No thyromegaly present.  Cardiovascular: Normal rate, regular rhythm and normal heart sounds.   No murmur heard. Pulmonary/Chest: Effort normal. No respiratory distress. He has no wheezes. He has rales (did not have his breathing treatment this morning).  Abdominal: Soft. He exhibits no distension. There is no tenderness.  PEG site clean, dry without discharge or tenderness  Musculoskeletal: He exhibits no edema.  Lymphadenopathy:    He has no cervical adenopathy.  Neurological: He is alert and oriented to person, place, and time.  Skin: Skin is warm and dry.  Psychiatric: He has a normal mood and affect. His behavior is normal.        Assessment & Plan:   See Problem List for A/P  Follow up in 1-2 months, sooner if needed

## 2015-06-14 NOTE — Assessment & Plan Note (Signed)
Following with neurology Recently put on sinemet Has seen palliative care

## 2015-06-14 NOTE — Progress Notes (Signed)
Pre visit review using our clinic review tool, if applicable. No additional management support is needed unless otherwise documented below in the visit note. 

## 2015-06-14 NOTE — Assessment & Plan Note (Signed)
Stable Will check labs at next visit

## 2015-06-14 NOTE — Assessment & Plan Note (Signed)
BP Readings from Last 3 Encounters:  06/14/15 140/74  05/19/15 111/54  05/13/15 116/52   Appears controlled Continue current medications

## 2015-06-18 ENCOUNTER — Other Ambulatory Visit: Payer: Medicare HMO

## 2015-06-18 ENCOUNTER — Encounter: Payer: Self-pay | Admitting: Cardiology

## 2015-06-18 ENCOUNTER — Ambulatory Visit (INDEPENDENT_AMBULATORY_CARE_PROVIDER_SITE_OTHER): Payer: Medicare HMO | Admitting: Cardiology

## 2015-06-18 VITALS — BP 144/62 | HR 79 | Ht 69.0 in | Wt 126.0 lb

## 2015-06-18 DIAGNOSIS — N183 Chronic kidney disease, stage 3 unspecified: Secondary | ICD-10-CM

## 2015-06-18 DIAGNOSIS — I10 Essential (primary) hypertension: Secondary | ICD-10-CM

## 2015-06-18 DIAGNOSIS — I5042 Chronic combined systolic (congestive) and diastolic (congestive) heart failure: Secondary | ICD-10-CM

## 2015-06-18 DIAGNOSIS — N189 Chronic kidney disease, unspecified: Secondary | ICD-10-CM

## 2015-06-18 DIAGNOSIS — N179 Acute kidney failure, unspecified: Secondary | ICD-10-CM | POA: Diagnosis not present

## 2015-06-18 DIAGNOSIS — I5043 Acute on chronic combined systolic (congestive) and diastolic (congestive) heart failure: Secondary | ICD-10-CM | POA: Diagnosis not present

## 2015-06-18 DIAGNOSIS — I2510 Atherosclerotic heart disease of native coronary artery without angina pectoris: Secondary | ICD-10-CM

## 2015-06-18 DIAGNOSIS — I2583 Coronary atherosclerosis due to lipid rich plaque: Secondary | ICD-10-CM

## 2015-06-18 LAB — CBC WITH DIFFERENTIAL/PLATELET
Basophils Absolute: 0.1 10*3/uL (ref 0.0–0.1)
Basophils Relative: 1 % (ref 0–1)
Eosinophils Absolute: 0.3 10*3/uL (ref 0.0–0.7)
Eosinophils Relative: 5 % (ref 0–5)
HCT: 25.1 % — ABNORMAL LOW (ref 39.0–52.0)
Hemoglobin: 7.9 g/dL — ABNORMAL LOW (ref 13.0–17.0)
Lymphocytes Relative: 26 % (ref 12–46)
Lymphs Abs: 1.5 10*3/uL (ref 0.7–4.0)
MCH: 25 pg — ABNORMAL LOW (ref 26.0–34.0)
MCHC: 31.5 g/dL (ref 30.0–36.0)
MCV: 79.4 fL (ref 78.0–100.0)
MPV: 8.8 fL (ref 8.6–12.4)
Monocytes Absolute: 0.4 10*3/uL (ref 0.1–1.0)
Monocytes Relative: 7 % (ref 3–12)
Neutro Abs: 3.5 10*3/uL (ref 1.7–7.7)
Neutrophils Relative %: 61 % (ref 43–77)
Platelets: 311 10*3/uL (ref 150–400)
RBC: 3.16 MIL/uL — ABNORMAL LOW (ref 4.22–5.81)
RDW: 17.9 % — ABNORMAL HIGH (ref 11.5–15.5)
WBC: 5.8 10*3/uL (ref 4.0–10.5)

## 2015-06-18 LAB — COMPREHENSIVE METABOLIC PANEL
ALT: 4 U/L — ABNORMAL LOW (ref 9–46)
AST: 21 U/L (ref 10–35)
Albumin: 3.3 g/dL — ABNORMAL LOW (ref 3.6–5.1)
Alkaline Phosphatase: 69 U/L (ref 40–115)
BUN: 36 mg/dL — ABNORMAL HIGH (ref 7–25)
CO2: 29 mmol/L (ref 20–31)
Calcium: 8.7 mg/dL (ref 8.6–10.3)
Chloride: 103 mmol/L (ref 98–110)
Creat: 1.96 mg/dL — ABNORMAL HIGH (ref 0.70–1.18)
Glucose, Bld: 73 mg/dL (ref 65–99)
Potassium: 4.7 mmol/L (ref 3.5–5.3)
Sodium: 138 mmol/L (ref 135–146)
Total Bilirubin: 0.3 mg/dL (ref 0.2–1.2)
Total Protein: 7.5 g/dL (ref 6.1–8.1)

## 2015-06-18 NOTE — Patient Instructions (Signed)
Medication Instructions:   Your physician recommends that you continue on your current medications as directed. Please refer to the Current Medication list given to you today.   Labwork:  TODAY---CMET, CBC W DIFF, BNP    Follow-Up:  3 MONTHS WITH DR Delton SeeNELSON     If you need a refill on your cardiac medications before your next appointment, please call your pharmacy.

## 2015-06-18 NOTE — Progress Notes (Signed)
Patient ID: Martin Cain, male   DOB: 10/03/1945, 69 y.o.   MRN: [id number]    HPI: 69 year old male with h/o chronic combined systolic and diastolic CHF, CAD, s/p CABG in 2000, CKD stage 3, etoh abuse, medication non-compliance, HTN, COPD,  who was visiting from [place] and was admitted with NSTEMI  and CHF twice in the 2015, that was believed to be secondary to malignant hypertension and acute on chronic renal failure. He was discharged on BP meds that he didn't pick up and was not using. Afterwards he was non-complaint with his meds. His Lexiscan nuclear stress test in 05/2013 was negative for infarct or ischemia, LVEF was 42% with diffuse hypokinesis.  08/15/2014 - post hospitalization follow up, he was here 2 months ago, 24 lbs fluid overload, started on lasix 40 mg po daily, lost 22 lbs, he was in the hospital for worsening tremor on 11/18-11/19, he was found to be in acute on chronic renal insufficiency, Crea up to 2.7, lasix was held, Crea down to 2.0. Today he denies SOB, CP< no orthopnea, PND, no LE edema. He is in a nursing home, in a wheelchair, able to walk few steps only.   Filed Vitals:   06/18/15 1126  BP: 144/62  Pulse: 79  Height:  (1.753 m)  Weight: 126 lb (57.153 kg)  SpO2: 98%    Intake/Output Summary (Last 24 hours) at 05/31/13 1191 Last data filed at 05/30/13 2300  Gross per 24 hour  Intake    720 ml  Output    550 ml  Net    170 ml    SUBJECTIVE Patient mentally much clearer today. Denies chest pain or SOB. Biggest complaint is of leg pain- this is improved with oxycontin.  LABS: Basic Metabolic Panel: No results for input(s): NA, K, CL, CO2, GLUCOSE, BUN, CREATININE, CALCIUM, MG, PHOS in the last 72 hours.  BNP: 6983  Radiology/Studies:  Dg Chest 2 View  05/27/2013   CLINICAL DATA:  Hypertension, peripheral swelling  EXAM: CHEST  2 VIEW  COMPARISON:  None  FINDINGS: Cardiac shadow is mildly enlarged. Postsurgical changes are seen. The lungs are well  aerated bilaterally without focal infiltrate or sizable effusion. No acute bony abnormality is noted.  IMPRESSION: No acute abnormality noted.   Electronically Signed   By: Alcide Clever M.D.   On: 05/27/2013 10:10   MRI HEAD WITHOUT CONTRAST  TECHNIQUE:  Multiplanar, multiecho pulse sequences of the brain and surrounding  structures were obtained without intravenous contrast.  COMPARISON: None.  FINDINGS:  Exam is motion degraded.  No acute infarct.  No intracranial hemorrhage.  Remote small right basal ganglia/thalamic infarct. Remote tiny right  paracentral pontine infarct. Remote tiny left cerebellar infarct.  Mild small vessel disease type changes.  Atrophy. Ventricular prominence felt to be related to atrophy rather  than hydrocephalus.  No intracranial mass lesion noted on this unenhanced exam.  Small right vertebral artery with superimposed atherosclerotic type  changes and significant narrowing. Left vertebral artery, basilar  artery and the internal carotid arteries as well as major dural  sinuses are patent.  Minimal spinal stenosis C3-4. Cervical medullary junction, pituitary  region, pineal region and orbital structures unremarkable.  Minimal paranasal sinus mucosal thickening.  IMPRESSION:  Exam is motion degraded.  No acute infarct.  Remote small right basal ganglia/thalamic infarct. Remote tiny right  paracentral pontine infarct. Remote tiny left cerebellar infarct.  Mild small vessel disease type changes.  Atrophy.  Small right vertebral  artery with superimposed atherosclerotic type  changes and significant narrowing.  Electronically Signed  By: Bridgett LarssonSteve Olson M.D.  On: 05/30/2013 17:15   Ecg: NSR, LAD, LVH with repolarization abnormality.  PHYSICAL EXAM General: Thin, frail, alert Head: Normal. Neck: Negative for carotid bruits. JVD not elevated. Lungs: Clear bilaterally to auscultation without wheezes, rales, or rhonchi. Breathing is unlabored. Heart: RRR S1  S2 without murmurs, rubs, or gallops.  Abdomen: Soft, non-tender, non-distended with normoactive bowel sounds. No hepatomegaly. Extremities: No clubbing, cyanosis, ankle edema B/L. Swelling and tenderness of he right knee.  Distal pedal pulses are 2+ and equal bilaterally. Neuro: alert oriented x 3.  Lexiscan nuclear stress test: 05/23/2013 IMPRESSION: 1. Matched attenuation involving the inferior wall of the left ventricle without associated discrete wall motion abnormality. No definite scintigraphic evidence of prior infarction or pharmacologically induced ischemia. 2. Mild dilatation of the left ventricular cavity (end-diastolic volume - 191 ml). 3. Mild global hypokinesia. Ejection fraction - 42%.  Electronically Signed By: Simonne ComeJohn Watts M.D. On: 06/22/2013 11:59  TTE: 05/18/2015 Left ventricle: LV is coursely trabeculated. The cavity size was mildly dilated. Wall thickness was increased in a pattern of mild LVH. There was moderate concentric hypertrophy. Systolic function was moderately to severely reduced. The estimated ejection fraction was in the range of 30% to 35%. Doppler parameters are consistent with abnormal left ventricular relaxation (grade 1 diastolic dysfunction). - Aortic valve: AV is thickened, calcified with restricted motion Peak and mean gradients through the valve aer 25 and 15 mm Hg respectively 2 D imaging suggests that AS is moderate. There was mild regurgitation. - Right ventricle: Systolic function was moderately reduced. - Pericardium, extracardiac: A trivial pericardial effusion was identified. There was a left pleural effusion.    ASSESSMENT AND PLAN:  1. Acute on chronic combined systolic and diastolic CHF: the patient recently [place] in [place], he was fluid overloaded, lasix increased and he lost 22 lbs 148--> 126 today. Now off lasix, euvolemic. Labs - CMP today. Obtain TTE as there is none in Epic.  2. CAD, h/o  CABG in 2000, Elevated troponin chronically, on both hospital visits. ECG shows new changes in the inferior leads suspicious for ischemia, however he denies chest pain and currently is CHD stage 3.  Continue ASA, BB, ACEI and statin.  3. Anemia - normocytic - normal EGD on 03/30/15, normal colonoscopy a year ago, planned outpatient capsule endoscopy, we will check CBC today.  4. Malignant Hypertension. Controlled now.  5. Hyperlipidemia - on Crestor  6. Smoking - active - counseled  Follow up in 3 months.  Rico SheehanSigned, Teigen Parslow H MD,FACC 06/18/2015 11:47 AM

## 2015-06-19 ENCOUNTER — Telehealth: Payer: Self-pay | Admitting: *Deleted

## 2015-06-19 DIAGNOSIS — D649 Anemia, unspecified: Secondary | ICD-10-CM

## 2015-06-19 DIAGNOSIS — D509 Iron deficiency anemia, unspecified: Secondary | ICD-10-CM

## 2015-06-19 LAB — BRAIN NATRIURETIC PEPTIDE: Brain Natriuretic Peptide: 1174.1 pg/mL — ABNORMAL HIGH (ref 0.0–100.0)

## 2015-06-19 NOTE — Telephone Encounter (Signed)
-----   Message from Lars MassonKatarina H Nelson, MD sent at 06/19/2015 12:04 PM EST ----- His Hb is worse, and results consistent with microcytic anemia, they should make sure that he follows with GI for capsule endoscope.

## 2015-06-19 NOTE — Telephone Encounter (Signed)
Notified the pt and the daughter that per Dr Delton SeeNelson, the pts labs showed that his results are consistent with microcytic anemia, and they should make sure that he follows with GI for a capsule endoscope.  Per the daughter, the pt does not have a GI doctor and would like a referral to them.  Informed the daughter and the pt that I will place the referral in the system and send a message to our [facility] to call the pt and arrange this.  Daughter request that the schedulers call her vs the pt, for she has to arrange transportation for the pt. Both verbalized understanding and agrees with this plan.

## 2015-06-20 ENCOUNTER — Telehealth: Payer: Self-pay | Admitting: Cardiology

## 2015-06-20 NOTE — Telephone Encounter (Signed)
Will route this information to Dr Delton SeeNelson to make her aware of issue mentioned below, and pts family being non-compliant with obtaining requested records from [facility] in [place].  This is in regards to the pt needing to follow-up with GI for a capsule endoscopy for worsening of microcytic anemia, and worsening of hemoglobin.

## 2015-06-20 NOTE — Telephone Encounter (Signed)
Spoke with [employer] @ [facility] who stated that they have been trying to get patient records from [facility] in [place] since November. Dr. Dulce Sellarutlaw did emergency surgery on him in August.  Dr. Dulce Sellarutlaw can't see the patient until he receive these records due to his history.   [facility] office spoke with a family member on 06-05-15  by the name of Martie LeeSabrina and ask the family to assist them in getting the records.

## 2015-06-27 ENCOUNTER — Telehealth: Payer: Self-pay | Admitting: Internal Medicine

## 2015-06-27 NOTE — Telephone Encounter (Signed)
ok 

## 2015-06-27 NOTE — Telephone Encounter (Signed)
Spoke with Wilkie AyeKristy from [facility] to give verbal orders for pt. Wilkie AyeKristy stated that they will also be needing nursing due to the pt having a feeding tube but not currently using the feeding tube.

## 2015-06-27 NOTE — Telephone Encounter (Signed)
Please advise 

## 2015-06-27 NOTE — Telephone Encounter (Signed)
Patient is being discharged from [facility] today.  They have requesting OT, PT, Speech.  Would like to know if Dr. Lawerance BachBurns will be the overseeing doctor?

## 2015-07-03 ENCOUNTER — Telehealth: Payer: Self-pay | Admitting: Internal Medicine

## 2015-07-03 NOTE — Telephone Encounter (Signed)
Heather call from [facility] request to verify Martin Cain medication? Please call her back

## 2015-07-05 ENCOUNTER — Ambulatory Visit: Payer: Medicare HMO | Admitting: Internal Medicine

## 2015-07-05 NOTE — Telephone Encounter (Signed)
LVM for staff member to call back regarding pt. Need to find out when and if a swallowing study was done while pt was at facility.

## 2015-07-09 ENCOUNTER — Telehealth: Payer: Self-pay | Admitting: Internal Medicine

## 2015-07-09 NOTE — Telephone Encounter (Signed)
ok 

## 2015-07-09 NOTE — Telephone Encounter (Signed)
Please advise 

## 2015-07-09 NOTE — Telephone Encounter (Signed)
Martin Cain is looking for verbals to do FT eval for 07/11/2015. She will call back for treatment plan after eval

## 2015-07-09 NOTE — Telephone Encounter (Signed)
Verified with Martin Cain. Needing verbal orders for Speech Therapy. Orders were given

## 2015-07-11 ENCOUNTER — Telehealth: Payer: Self-pay | Admitting: Emergency Medicine

## 2015-07-11 NOTE — Telephone Encounter (Signed)
He should be seen as soon as possible.  If things get worse then ED

## 2015-07-11 NOTE — Telephone Encounter (Signed)
Home heatlh nurse called concerned that the pts o2 sats were running lower than normal. The Lowest 85%.. Highest 91% after treatment went to 98% no long after went back down to 91% She feels that the patient should be seen in our office before his appt on Monday. She is also concerned that the daughter informed her that the pt has failed swallowing test at [facility] but still chooses to eat and take meds by mouth knowing that this could cause aspiration. Please advise on if you think pt should come into the office, go to the ED or wait until Monday to be seen. Pt is not currently on home O2.

## 2015-07-12 ENCOUNTER — Encounter (HOSPITAL_COMMUNITY): Payer: Self-pay | Admitting: Emergency Medicine

## 2015-07-12 ENCOUNTER — Telehealth: Payer: Self-pay | Admitting: Emergency Medicine

## 2015-07-12 ENCOUNTER — Ambulatory Visit: Payer: Medicare HMO | Admitting: Family

## 2015-07-12 ENCOUNTER — Emergency Department (INDEPENDENT_AMBULATORY_CARE_PROVIDER_SITE_OTHER): Payer: Medicare HMO

## 2015-07-12 ENCOUNTER — Emergency Department (HOSPITAL_COMMUNITY)
Admission: EM | Admit: 2015-07-12 | Discharge: 2015-07-12 | Disposition: A | Discharge status: Home / Self Care | Payer: Medicare HMO | Source: Home / Self Care | Attending: Family Medicine | Admitting: Family Medicine

## 2015-07-12 ENCOUNTER — Telehealth: Payer: Self-pay | Admitting: Internal Medicine

## 2015-07-12 DIAGNOSIS — J439 Emphysema, unspecified: Secondary | ICD-10-CM

## 2015-07-12 DIAGNOSIS — J9811 Atelectasis: Secondary | ICD-10-CM | POA: Diagnosis not present

## 2015-07-12 DIAGNOSIS — I6930 Unspecified sequelae of cerebral infarction: Secondary | ICD-10-CM | POA: Diagnosis not present

## 2015-07-12 NOTE — ED Notes (Signed)
Instructed to put on gown and provided warm blankets.

## 2015-07-12 NOTE — ED Notes (Signed)
2 family members with patient , one has identified self as the daughter and the primary care giver.  Daughter reports patient is sob.  Patient does not appear in respiratory distress.  Patient sounds congested, family says he is not coughing up any thing, no fever.  Patient has been to an eye appt today and HAD an appt for 4:00 pm today with his pcp.  Daughter thought the two would over lap and chose to bring patient to ucc instead of pcp.  Daughter reports patient has a Engineer, civil (consulting)nurse, a physical therapist and multiple services that come to the house to service patient.  When asked about patient's typical oxygen saturation, daughter was non-specific and quoted "70's-80's" patient's saturation reading today is 100%.  Patient followed instructions to get oral temperature, opening and closing mouth on command.  Skin warm and dry.  Caregivers are not specific with answers to this nurses questions, daughter is agitated.

## 2015-07-12 NOTE — Telephone Encounter (Signed)
LVM for Daughter to call back and schedule and appt for Pt to come in today. Dr Lawerance BachBurns is okay with him seeing whoever he can get in with.

## 2015-07-12 NOTE — Telephone Encounter (Signed)
Just wanted to let you know pts daughter called back and tried to get him back in this afternoon after cancelling. They were out of eye doctor pretty quick. She decided to take him to urgent care

## 2015-07-12 NOTE — ED Notes (Signed)
Patient transported to X-ray 

## 2015-07-12 NOTE — ED Provider Notes (Signed)
CSN: [id number]     Arrival date & time 07/12/15  1542 History   First MD Initiated Contact with Patient 07/12/15 1604     Chief Complaint  Patient presents with  . 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 Residual effects appeared to be dysphasia, a generalized muscle weakness greatest on the right, the requirement for assistance with ambulation and standing and speech difficulty. He was visited by the home health nurse yesterday and a reading on the pulse ox was in the 80s. They were advised to be seen by their PCP. They had an appointment at 4:00 this afternoon but for some reason chose to come to the urgent care. . Oximetry reading today is 100%. The patient is alert and is able to speak yes and no and short sentences. He denies shortness of breath, chest pain, URI symptoms, sore throat, earache. He does not appear to be in any distress. He is sitting in a wheelchair with relaxed posturing and showing no signs of respiratory difficulty. Is currently seeing a speech specialist for assistance with swallowing and speech improvement. There are other ancillary services that the patient is also provided. The daughter agrees that she does not see any evidence of shortness of breath this afternoon. The patient denies having been short of breath.   Past Medical History  Diagnosis Date  . Hypertension   . Hyperlipidemia   . CKD (chronic kidney disease)     a. Probable stage III.  Marland Kitchen CAD (coronary artery disease)     a. CABG 2000 in [place]. b. NSTEMI 04/2013 felt due to malignant hypertension (was instructed to f/u [facility] for stress test).  . Gout   . CHF (congestive heart failure) (HCC)     a. Echo 04/2013: EF 45-50%, mild AS, mild biatrial enlargement, sev LVH.  . Osteoarthritis     R knee  . Anemia   . Progressive  supranuclear palsies [facility])    Past Surgical History  Procedure Laterality Date  . Cardiac surgery      2000; [place]  . Appendectomy    . Abdominal exploration surgery    . Tonsillectomy    . Flexible sigmoidoscopy Left 05/11/2015    Procedure: FLEXIBLE SIGMOIDOSCOPY;  Surgeon: Willis Modena, MD;  Location: [facility];  Service: Endoscopy;  Laterality: Left;   Family History  Problem Relation Age of Onset  . Heart disease      No family history  . Cancer Father   . Hypertension Father   . Cancer Brother   . Cancer Sister   . Healthy Daughter    Social History  Substance Use Topics  . Smoking status: Former Smoker -- 0.50 packs/day for 40 years    Types: Cigarettes  . Smokeless tobacco: Never Used  . Alcohol Use: 0.0 oz/week    0 Standard drinks or equivalent per week     Comment: previously admitted to 1 pint per day. denies use for 1 month.    Review of Systems  Constitutional: Negative for fever, activity change and fatigue.  HENT: Negative for congestion, ear discharge, ear pain, postnasal drip, rhinorrhea and sore throat.   Eyes: Negative for visual disturbance.  Respiratory: Negative for cough and shortness of breath.   Cardiovascular: Negative for chest pain and leg swelling.  Gastrointestinal: Negative.   Genitourinary: Negative.   Skin: Negative.   Neurological: Positive for speech difficulty and  weakness. Negative for tremors and syncope.  Psychiatric/Behavioral: Negative for behavioral problems and confusion. The patient is not nervous/anxious and is not hyperactive.   All other systems reviewed and are negative.   Allergies  Review of patient's allergies indicates no known allergies.  Home Medications   Prior to Admission medications   Medication Sig Start Date End Date Taking? Authorizing Provider  acetaminophen (TYLENOL) 325 MG tablet Take 2 tablets (650 mg total) by mouth every 6 (six) hours as needed for mild pain (or Fever >/= 101). 05/19/15    Belkys A Regalado, MD  albuterol (PROVENTIL HFA;VENTOLIN HFA) 108 (90 BASE) MCG/ACT inhaler Inhale 2 puffs into the lungs every 6 (six) hours as needed for wheezing or shortness of breath. 06/28/13   Lars Masson, MD  allopurinol (ZYLOPRIM) 100 MG tablet Place 1 tablet (100 mg total) into feeding tube daily. 04/24/15   Maryann Mikhail, DO  amLODipine (NORVASC) 10 MG tablet Place 1 tablet (10 mg total) into feeding tube daily. 04/24/15   Maryann Mikhail, DO  atorvastatin (LIPITOR) 80 MG tablet Place 1 tablet (80 mg total) into feeding tube daily. 04/24/15   Maryann Mikhail, DO  carbidopa-levodopa (SINEMET) 25-100 MG tablet Take 0.5 tablets by mouth 3 (three) times daily. 05/07/15   Donika K Patel, DO  carvedilol (COREG) 25 MG tablet Place 1 tablet (25 mg total) into feeding tube 2 (two) times daily. 04/24/15   Maryann Mikhail, DO  Cholecalciferol (VITAMIN D PO) Place 1,000 mg into feeding tube daily.     Historical Provider, MD  colchicine 0.6 MG tablet Place 0.6 mg into feeding tube daily.     Historical Provider, MD  [facility] ASPIRIN LOW DOSE 81 MG EC tablet Take 81 mg by mouth daily. 04/11/15   Historical Provider, MD  Cyanocobalamin (VITAMIN B-12 PO) Place 1,000 mg into feeding tube daily.     Historical Provider, MD  ferrous sulfate 325 (65 FE) MG tablet Place 325 mg into feeding tube every morning.  04/11/15   Historical Provider, MD  Fluticasone-Salmeterol (ADVAIR) 250-50 MCG/DOSE AEPB Inhale 1 puff into the lungs 2 (two) times daily. 06/09/13   Lars Masson, MD  folic acid (FOLVITE) 1 MG tablet Place 1 mg into feeding tube daily.  04/11/15   Historical Provider, MD  hydrALAZINE (APRESOLINE) 100 MG tablet Place 1 tablet (100 mg total) into feeding tube every morning. 04/24/15   Maryann Mikhail, DO  hydrocortisone (ANUSOL-HC) 2.5 % rectal cream Place rectally 4 (four) times daily. 05/13/15   Kathlen Mody, MD  ipratropium-albuterol (DUONEB) 0.5-2.5 (3) MG/3ML SOLN Inhale 3 mLs into the lungs  every 6 (six) hours as needed (shortness of breath).  04/11/15   Historical Provider, MD  isosorbide dinitrate (ISORDIL) 40 MG tablet Place 40 mg into feeding tube daily.    Historical Provider, MD  levETIRAcetam (KEPPRA) 500 MG tablet Take 500 mg by mouth 2 (two) times daily.    Historical Provider, MD  LORazepam (ATIVAN) 1 MG tablet Inject 1 mg into the muscle every 6 (six) hours as needed for anxiety.    Historical Provider, MD  Multiple Vitamin (MULTIVITAMIN WITH MINERALS) TABS tablet Take 1 tablet by mouth daily. Patient taking differently: Place 1 tablet into feeding tube daily.  04/24/15   Maryann Mikhail, DO  nitroGLYCERIN (NITROSTAT) 0.4 MG SL tablet Place 0.4 mg under the tongue every 5 (five) minutes as needed for chest pain.    Historical Provider, MD  Nutritional Supplements (FEEDING SUPPLEMENT, JEVITY 1.5 CAL/FIBER,) LIQD Place  240 mLs into feeding tube 6 (six) times daily. 05/19/15   Belkys A Regalado, MD  pantoprazole sodium (PROTONIX) 40 mg/20 mL PACK Place 40 mg into feeding tube daily. In 30 ml of applesauce.    Historical Provider, MD  thiamine 100 MG tablet Take 1 tablet (100 mg total) by mouth daily. Patient taking differently: Place 100 mg into feeding tube daily.  04/24/15   Maryann Mikhail, DO  Water For Irrigation, Sterile (FREE WATER) SOLN Place 100 mLs into feeding tube 5 (five) times daily. 05/13/15   Kathlen ModyVijaya Akula, MD   Meds Ordered and Administered this Visit  Medications - No data to display  BP 142/55 mmHg  Pulse 76  Temp(Src) 98.1 F (36.7 C) (Oral)  Resp 24  SpO2 100% No data found.   Physical Exam  Constitutional: He appears well-developed. No distress.  HENT:  Bilateral TMs obscured by cerumen. Oropharynx with minimal erythema. No PND or exudates.  Eyes: EOM are normal.  Neck: Normal range of motion.  Cardiovascular: Normal rate and regular rhythm.   Pulmonary/Chest: Effort normal and breath sounds normal. No respiratory distress. He has no  wheezes. He has no rales.  Unable or unwilling to take deep breaths. Auscultation with patient at rest reveals no adventitious sounds or wheezing.  Musculoskeletal: He exhibits no edema.  Lymphadenopathy:    He has no cervical adenopathy.  Neurological: He is alert. A cranial nerve deficit is present. He exhibits normal muscle tone. Coordination abnormal.  Skin: Skin is warm and dry.  Psychiatric: He has a normal mood and affect.  Nursing note and vitals reviewed.   ED Course  Procedures (including critical care time)  Labs Review Labs Reviewed - No data to display  Imaging Review Dg Chest 2 View  07/12/2015  CLINICAL DATA:  Wheezing, difficulty breathing, light smoker, hypoxia yesterday, dyspnea, hypertension, CHF EXAM: CHEST  2 VIEW COMPARISON:  06/19/2013 FINDINGS: Enlargement of cardiac silhouette post median sternotomy. Atherosclerotic calcification aorta. Pulmonary vascularity normal. Lungs hyperinflated with subsegmental atelectasis at LEFT base. No definite acute infiltrate, pleural effusion or pneumothorax. Bones demineralized. IMPRESSION: Mild enlargement of cardiac silhouette post median sternotomy. Hyperinflated lungs with subsegmental atelectasis LEFT base. Electronically Signed   By: Ulyses SouthwardMark  Boles M.D.   On: 07/12/2015 17:34     Visual Acuity Review  Right Eye Distance:   Left Eye Distance:   Bilateral Distance:    Right Eye Near:   Left Eye Near:    Bilateral Near:         MDM   1. Atelectasis of left lung   2. History of CVA with residual deficit    patient is alert and stable. Respirations even and nonlabored. Chest x-ray results as above. Small area of atelectasis in the left base. This is a very small area. Take deep breaths more often and force cough periodically. Oxygen levels are 100% today. Lung sounds are clear. No signs of fluid in the lungs or infection. Robitussin every 6 hours as needed. For worsening, increased cough, shortness of breath,  fevers or other problems see your primary care doctor or go to the emergency department.    Hayden Rasmussenavid Wreatha Sturgeon, NP 07/12/15 1757  Hayden Rasmussenavid Adriann Thau, NP 07/12/15 [phone]

## 2015-07-12 NOTE — Telephone Encounter (Signed)
LVM for daughter. Per Dr Lawerance BachBurns, Pt needs to come into our office today. Also, called Heather with [facility] to inform of status.

## 2015-07-12 NOTE — Discharge Instructions (Signed)
Atelectasis, Adult This is a very small area. Take deep breaths more often and force cough periodically. Oxygen levels are 100% today. Lung sounds are clear. No signs of fluid in the lungs or infection. Atelectasis is a collapse of the small air sacs in the lungs (alveoli). When this occurs, all or part of a lung collapses and becomes airless. It can be caused by various things and is a common problem after surgery. The severity of atelectasis will vary depending on the size of the area involved and the underlying cause of the condition. CAUSES  There are multiple causes for atelectasis:   Shallow breathing, particularly if there is an injury to your chest wall or abdomen that makes it painful to take a deep breath. This commonly occurs after surgery.  Obstruction of your airways (bronchi or bronchioles). This may be caused by a buildup of mucus (mucus plug), tumors, blood clots (pulmonary embolus), or inhaled foreign bodies. Mucus plugs occur when the lungs do not expand enough to get rid of mucus.  Outside pressure on the lung. This may be caused by tumors, fluid (pleural effusion), or a leakage of air between the lung and rib cage (pneumothorax).   Infections such as pneumonia.  Scarring in lung tissue left over from previous infection or injury.  Some diseases such as cystic fibrosis. SIGNS AND SYMPTOMS  Often, atelectasis will have no symptoms. When symptoms occur, they include:  Shortness of breath.   Bluish color to your nails, lips, or mouth (cyanosis). DIAGNOSIS  Your health care provider may suspect atelectasis based on symptoms and physical findings. A chest X-ray may be done to confirm the diagnosis. More specialized X-ray exams are sometimes required.  TREATMENT  Treatment will depend on the cause of the atelectasis. Treatment may include:  Purposeful coughing to loosen mucus plugs in the lungs.  Chest physiotherapy. This consists of clapping or percussion on the chest  over the lungs to further loosen mucus plugs.  Postural drainage techniques. This involves positioning your body so your head is lower than your chest. HOME CARE INSTRUCTIONS  Practice relaxed deep breathing whenever you are sitting down. A good technique is to take a few relaxed deep breaths each time a commercial comes on if you are watching television.  If you were given a deep breathing device (such as an incentive spirometer) or a mucus clearance device, use this regularly as directed by your health care provider.  Try to cough several times a day as directed by your health care provider.  Perform any chest physiotherapy or postural drainage techniques as directed by your health care provider. If necessary, have someone (such as a family member) assist you with these techniques.  When lying down, lie on the unaffected side to encourage mucus drainage.  Stay physically active as much as possible. SEEK IMMEDIATE MEDICAL CARE IF:   You develop increasing problems with your breathing.   You develop severe chest pain.   You develop severe coughing, or you cough up blood.   You have a fever or persistent symptoms for more than 2-3 days.   You have a fever and your symptoms suddenly get worse.  MAKE SURE YOU:  Understand these instructions.  Will watch your condition.  Will get help right away if you are not doing well or get worse.   This information is not intended to replace advice given to you by your health care provider. Make sure you discuss any questions you have with your health care  provider.   Document Released: 06/16/2005 Document Revised: 07/07/2014 Document Reviewed: 12/22/2012 Elsevier Interactive Patient Education [employer]

## 2015-07-13 ENCOUNTER — Telehealth: Payer: Self-pay | Admitting: Internal Medicine

## 2015-07-13 NOTE — Telephone Encounter (Signed)
Please advise 

## 2015-07-13 NOTE — Telephone Encounter (Signed)
Martin Cain called from [facility] request order for speech therapy for Martin Cain 1 time a week for 3 week. Please call her back

## 2015-07-15 NOTE — Telephone Encounter (Signed)
ok 

## 2015-07-16 ENCOUNTER — Ambulatory Visit: Payer: Medicare HMO | Admitting: Internal Medicine

## 2015-07-16 ENCOUNTER — Encounter: Payer: Self-pay | Admitting: Internal Medicine

## 2015-07-16 ENCOUNTER — Ambulatory Visit (INDEPENDENT_AMBULATORY_CARE_PROVIDER_SITE_OTHER): Payer: Medicare HMO | Admitting: Internal Medicine

## 2015-07-16 VITALS — BP 164/72 | HR 84 | Temp 98.3°F | Resp 18 | Wt 121.0 lb

## 2015-07-16 DIAGNOSIS — G231 Progressive supranuclear ophthalmoplegia [Steele-Richardson-Olszewski]: Secondary | ICD-10-CM

## 2015-07-16 DIAGNOSIS — I10 Essential (primary) hypertension: Secondary | ICD-10-CM | POA: Diagnosis not present

## 2015-07-16 DIAGNOSIS — E440 Moderate protein-calorie malnutrition: Secondary | ICD-10-CM | POA: Diagnosis not present

## 2015-07-16 NOTE — Patient Instructions (Signed)
  We will send in prescriptions to your pharmacy.    We will contact your home health agency to initiate some of the things we discussed.  Follow up next week as already scheduled.

## 2015-07-16 NOTE — Assessment & Plan Note (Signed)
Following with neurology Difficulty ambulation, poor gait-medically necessary for him to have him use a walker. Preferably with a seat due to deconditioning, poor stamina and high risk of falls Physical therapy, speech therapy and occupational therapy in place Difficulty swallowing and high risk for aspiration-precautions being taken. Discussed risk and he still wants to try to eat and take his medications orally G-tube in place-we will not remove because he may need this in the future-daughter needs to be taught how to flush and this needs to be flushed regularly

## 2015-07-16 NOTE — Progress Notes (Signed)
Pre visit review using our clinic review tool, if applicable. No additional management support is needed unless otherwise documented below in the visit note. 

## 2015-07-16 NOTE — Assessment & Plan Note (Signed)
G-tube in place, but not use Tolerating by mouth, but high risk for aspiration. Speech therapy coming to his house We'll refer to nutrition for their advice Daughter needs to be advised how to flush G-tube-we will talk to visiting nurse

## 2015-07-16 NOTE — Assessment & Plan Note (Signed)
Elevated here today Return soon to recheck

## 2015-07-16 NOTE — Telephone Encounter (Signed)
Spoke with Selena BattenKim to give verbal orders.

## 2015-07-16 NOTE — Progress Notes (Signed)
Subjective:    Patient ID: Martin Cain, male    DOB: 1946-01-05, 70 y.o.   MRN: [id number]  HPI He is here with his daughter today.  When the nurse came out to his house last week his oxygen was low. When one of the therapists came later that day it was normal. She did take him to urgent care and his oxygen was 100% and chest x-ray was normal. There is no evidence of aspiration.  G-tube, dysphagia, concern for aspiration pneumonia: He does have speech therapy coming to his house. He is at risk for aspiration pneumonia and he understands the risk. He wants to eat and ideally wants to have the G-tube removed. His daughter has convinced him to leave in place in case he is not able to eat at times. Overall he eats fairly well, but on occasion he will choke when eating or taking his medication. His medications are crushed in applesauce. He tends to drink she can morning and has no difficulty drinking now. Both him and his daughter feel his appetite is good. He would like to gain weight.  His daughter would like to be shown how to use the G-tube-she has never been shown in the past. It is currently not in use and has not been flushed.  In addition to speech therapy he also has physical therapy, occupational therapy and a nurse. His daughter feels that he needs additional help. He does have a Child psychotherapist that she has not been able to connect with.  He does need a new rolling walker with a seat. He has significant gait disability and is high risk for falls. Walker isn't medically necessary for him to ambulate. He has low stamina and a walker with seat would be ideal so that he can rest if needed.  Recently he has had some increased urination, but yesterday his daughter did not notice this. He denies any specific complaints such as blood in urine or pain with urination.  He has been drooling a lot-related is also likely. While he was in the hospital she did have a suction machine and finds very  helpful. He and his daughter wonder if he could have this at home.    Medications and allergies reviewed with patient and updated if appropriate.  Patient Active Problem List   Diagnosis Date Noted  . Cataract 06/14/2015  . CAD (coronary artery disease) 06/14/2015  . COPD (chronic obstructive pulmonary disease) (HCC) 06/14/2015  . Tremors of nervous system 05/16/2015  . AKI (acute kidney injury) (HCC) 05/16/2015  . Hyperkalemia 05/16/2015  . Chronic combined systolic and diastolic congestive heart failure (HCC) 05/16/2015  . Normocytic anemia 05/16/2015  . Supranuclear palsies, progressive (HCC) 05/16/2015  . Essential hypertension 05/16/2015  . CKD (chronic kidney disease), stage III 05/16/2015  . Progressive supranuclear palsies (HCC)   . Pressure ulcer 05/09/2015  . Protein-calorie malnutrition, severe 05/09/2015  . Anemia 05/08/2015  . Absolute anemia   . Dysphagia   . Palliative care encounter   . Symptomatic anemia 04/17/2015  . Malnutrition of moderate degree (HCC) 06/21/2013  . HTN (hypertension) 06/20/2013  . Acute on chronic renal failure (HCC) 05/30/2013  . Acute on chronic combined systolic and diastolic CHF (congestive heart failure) (HCC) 05/28/2013  . Acute exacerbation of CHF (congestive heart failure) (HCC) 05/27/2013  . Elevated troponin 05/27/2013  . Gout flare 05/27/2013  . HTN (hypertension), malignant 05/27/2013    Current Outpatient Prescriptions on File Prior to Visit  Medication Sig  Dispense Refill  . acetaminophen (TYLENOL) 325 MG tablet Take 2 tablets (650 mg total) by mouth every 6 (six) hours as needed for mild pain (or Fever >/= 101). 30 tablet 0  . albuterol (PROVENTIL HFA;VENTOLIN HFA) 108 (90 BASE) MCG/ACT inhaler Inhale 2 puffs into the lungs every 6 (six) hours as needed for wheezing or shortness of breath. 1 Inhaler 3  . allopurinol (ZYLOPRIM) 100 MG tablet Place 1 tablet (100 mg total) into feeding tube daily.  1  . amLODipine (NORVASC)  10 MG tablet Place 1 tablet (10 mg total) into feeding tube daily. 30 tablet 3  . atorvastatin (LIPITOR) 80 MG tablet Place 1 tablet (80 mg total) into feeding tube daily.  1  . carbidopa-levodopa (SINEMET) 25-100 MG tablet Take 0.5 tablets by mouth 3 (three) times daily. 90 tablet 1  . carvedilol (COREG) 25 MG tablet Place 1 tablet (25 mg total) into feeding tube 2 (two) times daily. 60 tablet 6  . Cholecalciferol (VITAMIN D PO) Place 1,000 mg into feeding tube daily.     . colchicine 0.6 MG tablet Place 0.6 mg into feeding tube daily.     . [facility] ASPIRIN LOW DOSE 81 MG EC tablet Take 81 mg by mouth daily.  0  . Cyanocobalamin (VITAMIN B-12 PO) Place 1,000 mg into feeding tube daily.     . ferrous sulfate 325 (65 FE) MG tablet Place 325 mg into feeding tube every morning.   3  . Fluticasone-Salmeterol (ADVAIR) 250-50 MCG/DOSE AEPB Inhale 1 puff into the lungs 2 (two) times daily.    . folic acid (FOLVITE) 1 MG tablet Place 1 mg into feeding tube daily.   3  . hydrALAZINE (APRESOLINE) 100 MG tablet Place 1 tablet (100 mg total) into feeding tube every morning.  3  . hydrocortisone (ANUSOL-HC) 2.5 % rectal cream Place rectally 4 (four) times daily. 30 g 0  . ipratropium-albuterol (DUONEB) 0.5-2.5 (3) MG/3ML SOLN Inhale 3 mLs into the lungs every 6 (six) hours as needed (shortness of breath).   1  . isosorbide dinitrate (ISORDIL) 40 MG tablet Place 40 mg into feeding tube daily.    Marland Kitchen. levETIRAcetam (KEPPRA) 500 MG tablet Take 500 mg by mouth 2 (two) times daily.    Marland Kitchen. LORazepam (ATIVAN) 1 MG tablet Inject 1 mg into the muscle every 6 (six) hours as needed for anxiety.    . Multiple Vitamin (MULTIVITAMIN WITH MINERALS) TABS tablet Take 1 tablet by mouth daily. (Patient taking differently: Place 1 tablet into feeding tube daily. )    . nitroGLYCERIN (NITROSTAT) 0.4 MG SL tablet Place 0.4 mg under the tongue every 5 (five) minutes as needed for chest pain.    . Nutritional Supplements (FEEDING SUPPLEMENT,  JEVITY 1.5 CAL/FIBER,) LIQD Place 240 mLs into feeding tube 6 (six) times daily. 350 mL 0  . pantoprazole sodium (PROTONIX) 40 mg/20 mL PACK Place 40 mg into feeding tube daily. In 30 ml of applesauce.    . thiamine 100 MG tablet Take 1 tablet (100 mg total) by mouth daily. (Patient taking differently: Place 100 mg into feeding tube daily. )    . Water For Irrigation, Sterile (FREE WATER) SOLN Place 100 mLs into feeding tube 5 (five) times daily.     No current facility-administered medications on file prior to visit.    Past Medical History  Diagnosis Date  . Hypertension   . Hyperlipidemia   . CKD (chronic kidney disease)     a. Probable stage  III.  Marland Kitchen CAD (coronary artery disease)     a. CABG 2000 in [place]. b. NSTEMI 04/2013 felt due to malignant hypertension (was instructed to f/u [facility] for stress test).  . Gout   . CHF (congestive heart failure) (HCC)     a. Echo 04/2013: EF 45-50%, mild AS, mild biatrial enlargement, sev LVH.  . Osteoarthritis     R knee  . Anemia   . Progressive supranuclear palsies [facility])     Past Surgical History  Procedure Laterality Date  . Cardiac surgery      2000; [place]  . Appendectomy    . Abdominal exploration surgery    . Tonsillectomy    . Flexible sigmoidoscopy Left 05/11/2015    Procedure: FLEXIBLE SIGMOIDOSCOPY;  Surgeon: Willis Modena, MD;  Location: [facility];  Service: Endoscopy;  Laterality: Left;    Social History   Social History  . Marital Status: Divorced    Spouse Name: N/A  . Number of Children: 2  . Years of Education: N/A   Social History Main Topics  . Smoking status: Former Smoker -- 0.50 packs/day for 40 years    Types: Cigarettes  . Smokeless tobacco: Never Used  . Alcohol Use: 0.0 oz/week    0 Standard drinks or equivalent per week     Comment: previously admitted to 1 pint per day. denies use for 1 month.  . Drug Use: No  . Sexual Activity: Not on file   Other Topics Concern  . Not on file   Social  History Narrative   Currently lives at [facility].     Retired from being an Lexicographer.    Family History  Problem Relation Age of Onset  . Heart disease      No family history  . Cancer Father   . Hypertension Father   . Cancer Brother   . Cancer Sister   . Healthy Daughter     Review of Systems  Constitutional: Negative for fever, chills and appetite change (Appetite is good).  HENT: Positive for trouble swallowing (occasional).   Respiratory: Positive for cough (Infrequent) and wheezing. Negative for shortness of breath.   Cardiovascular: Negative for chest pain, palpitations and leg swelling.  Gastrointestinal: Negative for nausea and abdominal pain.  Neurological: Negative for dizziness, light-headedness and headaches.       Gait disturbance, generalized weakness       Objective:   Filed Vitals:   07/16/15 1622  BP: 164/72  Pulse: 84  Temp: 98.3 F (36.8 C)  Resp: 18   Filed Weights   07/16/15 1622  Weight: 121 lb (54.885 kg)   Body mass index is 17.86 kg/(m^2).   Physical Exam  Constitutional:  Chronically ill-appearing, no acute distress  HENT:  Head: Normocephalic and atraumatic.  Mouth/Throat: Oropharynx is clear and moist.  Excessive saliva in mouth  Neck: Neck supple. No tracheal deviation present. No thyromegaly present.  Cardiovascular: Normal rate and regular rhythm.   Murmur heard. Pulmonary/Chest: Effort normal and breath sounds normal. No respiratory distress. He has no wheezes.  Abdominal: Soft. He exhibits no distension. There is no tenderness.  G-tube in place. G-tube has contents and has not been flushed  Musculoskeletal: He exhibits no edema.  Lymphadenopathy:    He has no cervical adenopathy.        Assessment & Plan:   See problem list for assessment and plan  The rolling walker with seat is medically necessary due to his poor gait, high  risk of falls and diagnosis of supraventricular palsies, which  is progressive

## 2015-07-17 ENCOUNTER — Telehealth: Payer: Self-pay | Admitting: Emergency Medicine

## 2015-07-17 NOTE — Telephone Encounter (Signed)
LVM with Heather from [place] to call back.

## 2015-07-17 NOTE — Telephone Encounter (Signed)
-----   Message from Pincus Sanes, MD sent at 07/16/2015  8:36 PM EST ----- We need to talk to his visiting nurse. He needs a new walker-I will try to current prescription. She would like a suction machine at home-not sure if this is possible. We need to have the nurse show her how to flush his G-tube-there are contents and has not been flushed. He also needs refills of everything sent to his pharmacy.

## 2015-07-17 NOTE — Telephone Encounter (Signed)
Spoke with Herbert Seta,

## 2015-07-17 NOTE — Telephone Encounter (Signed)
-----   Message from Stacy J Burns, MD sent at 07/16/2015  8:36 PM EST ----- We need to talk to his visiting nurse. He needs a new walker-I will try to current prescription. She would like a suction machine at home-not sure if this is possible. We need to have the nurse show her how to flush his G-tube-there are contents and has not been flushed. He also needs refills of everything sent to his pharmacy. 

## 2015-07-18 MED ORDER — ALBUTEROL SULFATE HFA 108 (90 BASE) MCG/ACT IN AERS: 2.0000 | INHALATION_SPRAY | Freq: Four times a day (QID) | RESPIRATORY_TRACT | Status: AC | PRN

## 2015-07-18 MED ORDER — IPRATROPIUM-ALBUTEROL 0.5-2.5 (3) MG/3ML IN SOLN: 3.0000 mL | Freq: Four times a day (QID) | RESPIRATORY_TRACT | Status: AC | PRN

## 2015-07-18 MED ORDER — COLCHICINE 0.6 MG PO TABS: 0.6000 mg | ORAL_TABLET | Freq: Every day | ORAL | Status: AC

## 2015-07-18 MED ORDER — LORAZEPAM 1 MG PO TABS: 1.0000 mg | ORAL_TABLET | Freq: Four times a day (QID) | ORAL | Status: AC | PRN

## 2015-07-18 MED ORDER — AMLODIPINE BESYLATE 10 MG PO TABS: 10.0000 mg | ORAL_TABLET | Freq: Every day | ORAL | Status: AC

## 2015-07-18 MED ORDER — LEVETIRACETAM 500 MG PO TABS
500.0000 mg | ORAL_TABLET | Freq: Two times a day (BID) | ORAL | Status: DC
Start: 2015-07-18 — End: 2015-09-20

## 2015-07-18 MED ORDER — FLUTICASONE-SALMETEROL 250-50 MCG/DOSE IN AEPB: 1.0000 | INHALATION_SPRAY | Freq: Two times a day (BID) | RESPIRATORY_TRACT | Status: AC

## 2015-07-18 MED ORDER — HYDRALAZINE HCL 100 MG PO TABS: 100.0000 mg | ORAL_TABLET | ORAL | Status: AC

## 2015-07-18 MED ORDER — ATORVASTATIN CALCIUM 80 MG PO TABS: 80.0000 mg | ORAL_TABLET | Freq: Every day | ORAL | Status: AC

## 2015-07-18 MED ORDER — FERROUS SULFATE 325 (65 FE) MG PO TABS: 325.0000 mg | ORAL_TABLET | ORAL | Status: AC

## 2015-07-18 MED ORDER — CARVEDILOL 25 MG PO TABS: 25.0000 mg | ORAL_TABLET | Freq: Two times a day (BID) | ORAL | Status: AC

## 2015-07-18 MED ORDER — ISOSORBIDE DINITRATE 40 MG PO TABS: 40.0000 mg | ORAL_TABLET | Freq: Every day | ORAL | Status: DC

## 2015-07-18 MED ORDER — ALLOPURINOL 100 MG PO TABS: 100.0000 mg | ORAL_TABLET | Freq: Every day | ORAL | Status: AC

## 2015-07-18 NOTE — Telephone Encounter (Signed)
Spoke with [employer] from [place]. Verbal orders given for Swallowing test. Pt will need to get suction supplies and walker for [facility] as they do not supply that. Will be contacting [facility] for those supplies.

## 2015-07-18 NOTE — Telephone Encounter (Signed)
Refills have been sent to pharm

## 2015-07-19 ENCOUNTER — Telehealth: Payer: Self-pay | Admitting: Emergency Medicine

## 2015-07-19 DIAGNOSIS — G231 Progressive supranuclear ophthalmoplegia [Steele-Richardson-Olszewski]: Secondary | ICD-10-CM

## 2015-07-19 NOTE — Telephone Encounter (Signed)
Heather from [place] called to inform that pt fell on 07/18/15. Spoke with daughter to verify that she would be okay with switching to [facility] for home health and supplies that were needed for pt. She was asking for medication to help the pt sleep at night because he is sleeping throughout the day and not at night. I informed the daughter that sleeping medication will increase the risk for falls even more. Once the referral is placed I will send over the orders for the Walker, suction machine, and shower chair.

## 2015-07-19 NOTE — Telephone Encounter (Signed)
ok 

## 2015-07-19 NOTE — Telephone Encounter (Signed)
Martin Cain is requesting orders for dietitian. Informed home health nurse that pt will be transferring care to [facility], she asked for orders while pt was still in their care. Please advise

## 2015-07-19 NOTE — Telephone Encounter (Signed)
Phoned in [facility]

## 2015-07-19 NOTE — Telephone Encounter (Signed)
I do not recommend sleep medication because he is high risk of falls.  He needs to change his behavior - no sleeping during the day - start by sleeping less and then eventually not sleeping at all during the day.

## 2015-07-20 ENCOUNTER — Ambulatory Visit: Payer: Medicare HMO | Admitting: Neurology

## 2015-07-23 ENCOUNTER — Telehealth: Payer: Self-pay | Admitting: Emergency Medicine

## 2015-07-23 ENCOUNTER — Telehealth: Payer: Self-pay | Admitting: Internal Medicine

## 2015-07-23 ENCOUNTER — Other Ambulatory Visit: Payer: Self-pay | Admitting: Emergency Medicine

## 2015-07-23 DIAGNOSIS — K117 Disturbances of salivary secretion: Secondary | ICD-10-CM

## 2015-07-23 DIAGNOSIS — R269 Unspecified abnormalities of gait and mobility: Secondary | ICD-10-CM

## 2015-07-23 NOTE — Telephone Encounter (Signed)
Pt needs referral put in for [facility] to switch care to them and discontinue with [facility].

## 2015-07-23 NOTE — Telephone Encounter (Signed)
Requesting 3 in 1 commode and PT requested walker and family requested bed.  Would like to discuss order through [facility]

## 2015-07-23 NOTE — Telephone Encounter (Signed)
DME ordered per pts request.

## 2015-07-23 NOTE — Telephone Encounter (Signed)
Unable to LVM with Mya. Spoke with daughter to inform of status of Home Health Transfer and equipment.

## 2015-07-23 NOTE — Telephone Encounter (Signed)
----- Message from Henderson Newcomer sent at 07/19/2015  2:11 PM EST ----- Regarding: RE: Suction Supplies and [employer]re[employer] I just faxed over the cheat sheet to your attention. Just let me know once your orders are in. Thanks! ----- Message -----    From: Gypsy Lore, CMA    Sent: 07/19/2015   9:01 AM      To: Melissa Stenson Subject: RE: Suction Supplies and [employer]                That would be great if you could fax those over so that I have a copy for future reference. [phone].  I spoke with the daughter and she said she would not mind switching everything over to you guys. She has used yall in the past and was very pleased.   We will put in the referral for Home Health, as well as, for the other supplies he is needing.   Thank you so much for your help!! ----- Message -----    From: Henderson Newcomer    Sent: 07/18/2015   2:06 PM      To: Gypsy Lore, CMA Subject: RE: Suction Supplies and Dan Humphreys                I can fax you instructions on how to get to the DME orders in Epic if you need them. I think Steffanie in your office knows how to get to them as well.  There can never be two home health agencies in the home at one time for nursing, therapy,etc.  However, there can be two agencies involved if one is doing home health and one is doing equipment.  Of course, we at [facility] would like to be your one-stop shop for everything for your patients with [place] Medicare and traditional Medicare!  ----- Message -----    From: Gypsy Lore, CMA    Sent: 07/18/2015   1:36 PM      To: Melissa Stenson Subject: RE: Suction Supplies and Walker                The pt does not currently have a suction machine.  I will get the order put in for the rolling walker. Is there any curtain way to do it other than the "For home use only"   The pt didn't not mention stopping care with [facility] I was under the impression after talking to co-workers that he would only be able to get the  supplies if he was released from [place]. Would it be easier to just switch everything over to [facility], rather than using two agencies?  Thank you for your help! Orie Fisherman CMA AAMA ----- Message -----    From: Henderson Newcomer    Sent: 07/18/2015  12:44 PM      To: Gypsy Lore, CMA Subject: RE: Suction Supplies and Walker                Does the patient have a suction machine?  If so, he would need to get the supplies from the company providing the machine.  We can take an order for a rolling walker.  I would just need a "For Home Use Only DME" order placed into Epic for that.  Just message me when that order is ready.  If the pt wanted to received home health services from [facility], he would have to be released from [place].  We can do equipment if another agency is providing the home  health.  [facility] does not provide equipment.  Does that help? Thanks! Melissa ----- Message -----    From: Gypsy Lore, CMA    Sent: 07/18/2015  11:13 AM      To: Henderson Newcomer Subject: Suction Supplies and [place] Loop,  I have a pt that is currently using [facility] for [facility]. He is needing suction supplies, for oral suctioning, no deep suction needed and doesn't have a trach. I spoke with the nurse and she stated that they were not able to give those supplies. He is also needing a rolling walker with a seat.   Please advise what I should do next with this pt. I was told that he would have to be released by [facility] in order to get services from [facility].   Thanks, Orie Fisherman, CMA AAMA

## 2015-07-23 NOTE — Telephone Encounter (Signed)
ordered

## 2015-07-24 ENCOUNTER — Telehealth: Payer: Self-pay | Admitting: Emergency Medicine

## 2015-07-24 DIAGNOSIS — G231 Progressive supranuclear ophthalmoplegia [Steele-Richardson-Olszewski]: Secondary | ICD-10-CM

## 2015-07-25 NOTE — Telephone Encounter (Signed)
Hospital Bed and Bedside commode ordered through [facility]

## 2015-07-27 ENCOUNTER — Inpatient Hospital Stay (HOSPITAL_COMMUNITY): Payer: Medicare HMO

## 2015-07-27 ENCOUNTER — Observation Stay (HOSPITAL_COMMUNITY)
Admission: EM | Admit: 2015-07-27 | Discharge: 2015-07-31 | Disposition: A | Discharge status: Skilled Nursing Facility | Payer: Medicare HMO | Attending: Family Medicine | Admitting: Family Medicine

## 2015-07-27 ENCOUNTER — Emergency Department (HOSPITAL_COMMUNITY): Payer: Medicare HMO

## 2015-07-27 ENCOUNTER — Encounter (HOSPITAL_COMMUNITY): Payer: Self-pay | Admitting: Vascular Surgery

## 2015-07-27 DIAGNOSIS — N183 Chronic kidney disease, stage 3 (moderate): Secondary | ICD-10-CM | POA: Insufficient documentation

## 2015-07-27 DIAGNOSIS — E43 Unspecified severe protein-calorie malnutrition: Secondary | ICD-10-CM | POA: Diagnosis not present

## 2015-07-27 DIAGNOSIS — R262 Difficulty in walking, not elsewhere classified: Secondary | ICD-10-CM | POA: Diagnosis not present

## 2015-07-27 DIAGNOSIS — R634 Abnormal weight loss: Secondary | ICD-10-CM | POA: Diagnosis present

## 2015-07-27 DIAGNOSIS — D649 Anemia, unspecified: Secondary | ICD-10-CM | POA: Diagnosis not present

## 2015-07-27 DIAGNOSIS — E440 Moderate protein-calorie malnutrition: Secondary | ICD-10-CM | POA: Diagnosis present

## 2015-07-27 DIAGNOSIS — I129 Hypertensive chronic kidney disease with stage 1 through stage 4 chronic kidney disease, or unspecified chronic kidney disease: Secondary | ICD-10-CM | POA: Insufficient documentation

## 2015-07-27 DIAGNOSIS — R410 Disorientation, unspecified: Secondary | ICD-10-CM | POA: Diagnosis present

## 2015-07-27 DIAGNOSIS — R627 Adult failure to thrive: Secondary | ICD-10-CM | POA: Diagnosis not present

## 2015-07-27 DIAGNOSIS — N179 Acute kidney failure, unspecified: Secondary | ICD-10-CM | POA: Insufficient documentation

## 2015-07-27 DIAGNOSIS — G231 Progressive supranuclear ophthalmoplegia [Steele-Richardson-Olszewski]: Secondary | ICD-10-CM | POA: Diagnosis not present

## 2015-07-27 DIAGNOSIS — G9340 Encephalopathy, unspecified: Secondary | ICD-10-CM | POA: Diagnosis present

## 2015-07-27 DIAGNOSIS — R251 Tremor, unspecified: Principal | ICD-10-CM | POA: Insufficient documentation

## 2015-07-27 DIAGNOSIS — E860 Dehydration: Secondary | ICD-10-CM | POA: Insufficient documentation

## 2015-07-27 DIAGNOSIS — Z87891 Personal history of nicotine dependence: Secondary | ICD-10-CM | POA: Diagnosis not present

## 2015-07-27 DIAGNOSIS — M109 Gout, unspecified: Secondary | ICD-10-CM | POA: Diagnosis not present

## 2015-07-27 DIAGNOSIS — R1310 Dysphagia, unspecified: Secondary | ICD-10-CM | POA: Diagnosis not present

## 2015-07-27 DIAGNOSIS — Z955 Presence of coronary angioplasty implant and graft: Secondary | ICD-10-CM | POA: Insufficient documentation

## 2015-07-27 DIAGNOSIS — R531 Weakness: Secondary | ICD-10-CM | POA: Insufficient documentation

## 2015-07-27 DIAGNOSIS — R4182 Altered mental status, unspecified: Secondary | ICD-10-CM

## 2015-07-27 DIAGNOSIS — F1010 Alcohol abuse, uncomplicated: Secondary | ICD-10-CM | POA: Insufficient documentation

## 2015-07-27 DIAGNOSIS — R54 Age-related physical debility: Secondary | ICD-10-CM | POA: Diagnosis present

## 2015-07-27 DIAGNOSIS — I5042 Chronic combined systolic (congestive) and diastolic (congestive) heart failure: Secondary | ICD-10-CM | POA: Diagnosis not present

## 2015-07-27 DIAGNOSIS — E870 Hyperosmolality and hypernatremia: Secondary | ICD-10-CM | POA: Diagnosis not present

## 2015-07-27 DIAGNOSIS — I2510 Atherosclerotic heart disease of native coronary artery without angina pectoris: Secondary | ICD-10-CM | POA: Insufficient documentation

## 2015-07-27 DIAGNOSIS — Z8673 Personal history of transient ischemic attack (TIA), and cerebral infarction without residual deficits: Secondary | ICD-10-CM | POA: Insufficient documentation

## 2015-07-27 DIAGNOSIS — Z66 Do not resuscitate: Secondary | ICD-10-CM | POA: Insufficient documentation

## 2015-07-27 DIAGNOSIS — G20 Parkinson's disease: Secondary | ICD-10-CM | POA: Diagnosis not present

## 2015-07-27 DIAGNOSIS — E785 Hyperlipidemia, unspecified: Secondary | ICD-10-CM | POA: Insufficient documentation

## 2015-07-27 DIAGNOSIS — J449 Chronic obstructive pulmonary disease, unspecified: Secondary | ICD-10-CM | POA: Insufficient documentation

## 2015-07-27 HISTORY — DX: Age-related physical debility: R54

## 2015-07-27 HISTORY — DX: Disorientation, unspecified: R41.0

## 2015-07-27 LAB — COMPREHENSIVE METABOLIC PANEL
ALK PHOS: 70 U/L (ref 38–126)
ALT: 17 U/L (ref 17–63)
ANION GAP: 7 (ref 5–15)
AST: 24 U/L (ref 15–41)
Albumin: 3.1 g/dL — ABNORMAL LOW (ref 3.5–5.0)
BUN: 36 mg/dL — ABNORMAL HIGH (ref 6–20)
CALCIUM: 9 mg/dL (ref 8.9–10.3)
CO2: 32 mmol/L (ref 22–32)
CREATININE: 2.35 mg/dL — AB (ref 0.61–1.24)
Chloride: 111 mmol/L (ref 101–111)
GFR, EST AFRICAN AMERICAN: 30 mL/min — AB (ref >60)
GFR, EST NON AFRICAN AMERICAN: 26 mL/min — AB (ref >60)
Glucose, Bld: 84 mg/dL (ref 65–99)
Potassium: 4.3 mmol/L (ref 3.5–5.1)
SODIUM: 150 mmol/L — AB (ref 135–145)
TOTAL PROTEIN: 7.4 g/dL (ref 6.5–8.1)
Total Bilirubin: 0.4 mg/dL (ref 0.3–1.2)

## 2015-07-27 LAB — URINE MICROSCOPIC-ADD ON
BACTERIA UA: NONE SEEN
RBC / HPF: NONE SEEN RBC/hpf (ref 0–5)
SQUAMOUS EPITHELIAL / LPF: NONE SEEN

## 2015-07-27 LAB — AMMONIA: Ammonia: 21 umol/L (ref 9–35)

## 2015-07-27 LAB — GLUCOSE, CAPILLARY: GLUCOSE-CAPILLARY: 77 mg/dL (ref 65–99)

## 2015-07-27 LAB — URINALYSIS, ROUTINE W REFLEX MICROSCOPIC
Bilirubin Urine: NEGATIVE
Glucose, UA: NEGATIVE mg/dL
Hgb urine dipstick: NEGATIVE
KETONES UR: NEGATIVE mg/dL
LEUKOCYTES UA: NEGATIVE
NITRITE: NEGATIVE
PROTEIN: 100 mg/dL — AB
Specific Gravity, Urine: 1.014 (ref 1.005–1.030)
pH: 7 (ref 5.0–8.0)

## 2015-07-27 LAB — MAGNESIUM: Magnesium: 1.9 mg/dL (ref 1.7–2.4)

## 2015-07-27 LAB — ETHANOL: Alcohol, Ethyl (B): <5 mg/dL (ref <5)

## 2015-07-27 LAB — CBC
HCT: 25.8 % — ABNORMAL LOW (ref 39.0–52.0)
HEMOGLOBIN: 7.6 g/dL — AB (ref 13.0–17.0)
MCH: 24.3 pg — AB (ref 26.0–34.0)
MCHC: 29.5 g/dL — ABNORMAL LOW (ref 30.0–36.0)
MCV: 82.4 fL (ref 78.0–100.0)
PLATELETS: 283 10*3/uL (ref 150–400)
RBC: 3.13 MIL/uL — AB (ref 4.22–5.81)
RDW: 21.1 % — ABNORMAL HIGH (ref 11.5–15.5)
WBC: 5.8 10*3/uL (ref 4.0–10.5)

## 2015-07-27 LAB — I-STAT CG4 LACTIC ACID, ED: LACTIC ACID, VENOUS: 0.57 mmol/L (ref 0.5–2.0)

## 2015-07-27 LAB — PHOSPHORUS: Phosphorus: 3.4 mg/dL (ref 2.5–4.6)

## 2015-07-27 MED ORDER — SODIUM CHLORIDE 0.9% FLUSH
3.0000 mL | Freq: Two times a day (BID) | INTRAVENOUS | Status: DC
  Administered 2015-07-28 – 2015-07-30 (×3): 3 mL via INTRAVENOUS

## 2015-07-27 MED ORDER — HYDRALAZINE HCL 50 MG PO TABS
100.0000 mg | ORAL_TABLET | Freq: Every day | ORAL | Status: DC
  Administered 2015-07-28 – 2015-07-31 (×4): 100 mg via ORAL
  Filled 2015-07-27 (×4): qty 2

## 2015-07-27 MED ORDER — AMLODIPINE BESYLATE 10 MG PO TABS
10.0000 mg | ORAL_TABLET | Freq: Every day | ORAL | Status: DC
  Administered 2015-07-28 – 2015-07-31 (×4): 10 mg via ORAL
  Filled 2015-07-27 (×4): qty 1

## 2015-07-27 MED ORDER — FREE WATER
100.0000 mL | Freq: Every day | Status: DC
  Administered 2015-07-27 – 2015-07-31 (×19): 100 mL

## 2015-07-27 MED ORDER — ALBUTEROL SULFATE (2.5 MG/3ML) 0.083% IN NEBU: 3.0000 mL | INHALATION_SOLUTION | Freq: Four times a day (QID) | RESPIRATORY_TRACT | Status: DC | PRN

## 2015-07-27 MED ORDER — JEVITY 1.5 CAL/FIBER PO LIQD
240.0000 mL | Freq: Every day | ORAL | Status: DC
  Administered 2015-07-27 – 2015-07-31 (×23): 240 mL
  Filled 2015-07-27 (×29): qty 1000

## 2015-07-27 MED ORDER — HYDRALAZINE HCL 100 MG PO TABS: 100.0000 mg | ORAL_TABLET | ORAL | Status: DC

## 2015-07-27 MED ORDER — ALLOPURINOL 100 MG PO TABS
100.0000 mg | ORAL_TABLET | Freq: Every day | ORAL | Status: DC
  Administered 2015-07-28 – 2015-07-31 (×4): 100 mg via ORAL
  Filled 2015-07-27 (×4): qty 1

## 2015-07-27 MED ORDER — COLCHICINE 0.6 MG PO TABS
0.6000 mg | ORAL_TABLET | Freq: Every day | ORAL | Status: DC
  Administered 2015-07-28: 0.6 mg via ORAL
  Filled 2015-07-27: qty 1

## 2015-07-27 MED ORDER — IPRATROPIUM-ALBUTEROL 0.5-2.5 (3) MG/3ML IN SOLN: 3.0000 mL | Freq: Four times a day (QID) | RESPIRATORY_TRACT | Status: DC | PRN

## 2015-07-27 MED ORDER — LORAZEPAM 1 MG PO TABS: 1.0000 mg | ORAL_TABLET | Freq: Four times a day (QID) | ORAL | Status: DC | PRN

## 2015-07-27 MED ORDER — FOLIC ACID 1 MG PO TABS
1.0000 mg | ORAL_TABLET | Freq: Every day | ORAL | Status: DC
Start: 2015-07-28 — End: 2015-07-28
  Administered 2015-07-28: 1 mg
  Filled 2015-07-27: qty 1

## 2015-07-27 MED ORDER — SODIUM CHLORIDE 0.9 % IV BOLUS (SEPSIS)
500.0000 mL | Freq: Once | INTRAVENOUS | Status: AC
  Administered 2015-07-27: 500 mL via INTRAVENOUS

## 2015-07-27 MED ORDER — LEVETIRACETAM 500 MG PO TABS
500.0000 mg | ORAL_TABLET | Freq: Two times a day (BID) | ORAL | Status: DC
  Administered 2015-07-27 – 2015-07-30 (×6): 500 mg via ORAL
  Filled 2015-07-27 (×6): qty 1

## 2015-07-27 MED ORDER — HYDROCORTISONE 2.5 % RE CREA
TOPICAL_CREAM | Freq: Four times a day (QID) | RECTAL | Status: DC
Start: 2015-07-27 — End: 2015-07-31
  Administered 2015-07-27: 1 via RECTAL
  Administered 2015-07-28 – 2015-07-31 (×12): via RECTAL
  Filled 2015-07-27: qty 28.35

## 2015-07-27 MED ORDER — MOMETASONE FURO-FORMOTEROL FUM 100-5 MCG/ACT IN AERO
2.0000 | INHALATION_SPRAY | Freq: Two times a day (BID) | RESPIRATORY_TRACT | Status: DC
  Administered 2015-07-27 – 2015-07-31 (×7): 2 via RESPIRATORY_TRACT
  Filled 2015-07-27: qty 8.8

## 2015-07-27 MED ORDER — ISOSORBIDE DINITRATE 20 MG PO TABS
40.0000 mg | ORAL_TABLET | Freq: Every day | ORAL | Status: DC
  Administered 2015-07-28 – 2015-07-31 (×4): 40 mg via ORAL
  Filled 2015-07-27 (×4): qty 2

## 2015-07-27 MED ORDER — ATORVASTATIN CALCIUM 80 MG PO TABS
80.0000 mg | ORAL_TABLET | Freq: Every day | ORAL | Status: DC
  Administered 2015-07-27 – 2015-07-29 (×3): 80 mg via ORAL
  Filled 2015-07-27 (×3): qty 1

## 2015-07-27 MED ORDER — PANTOPRAZOLE SODIUM 40 MG PO PACK
40.0000 mg | PACK | Freq: Every day | ORAL | Status: DC
  Administered 2015-07-28 – 2015-07-31 (×4): 40 mg
  Filled 2015-07-27 (×4): qty 20

## 2015-07-27 MED ORDER — ACETAMINOPHEN 325 MG PO TABS: 650.0000 mg | ORAL_TABLET | Freq: Four times a day (QID) | ORAL | Status: DC | PRN

## 2015-07-27 MED ORDER — DEXTROSE 5 % IV SOLN
INTRAVENOUS | Status: DC
  Administered 2015-07-28 – 2015-07-29 (×3): via INTRAVENOUS

## 2015-07-27 MED ORDER — CARVEDILOL 12.5 MG PO TABS
25.0000 mg | ORAL_TABLET | Freq: Two times a day (BID) | ORAL | Status: DC
  Administered 2015-07-27 – 2015-07-31 (×8): 25 mg via ORAL
  Filled 2015-07-27 (×8): qty 2

## 2015-07-27 MED ORDER — WHITE PETROLATUM GEL
Status: AC
  Administered 2015-07-27: 21:00:00
  Filled 2015-07-27: qty 1

## 2015-07-27 MED ORDER — FERROUS SULFATE 325 (65 FE) MG PO TABS
325.0000 mg | ORAL_TABLET | Freq: Every day | ORAL | Status: DC
  Filled 2015-07-27: qty 1

## 2015-07-27 MED ORDER — NITROGLYCERIN 0.4 MG SL SUBL: 0.4000 mg | SUBLINGUAL_TABLET | SUBLINGUAL | Status: DC | PRN

## 2015-07-27 MED ORDER — HEPARIN SODIUM (PORCINE) 5000 UNIT/ML IJ SOLN: 5000.0000 [IU] | Freq: Three times a day (TID) | INTRAMUSCULAR | Status: DC

## 2015-07-27 NOTE — Progress Notes (Signed)
EEG Completed; Results Pending  

## 2015-07-27 NOTE — Progress Notes (Signed)
CSW contacted Patient's daughter (emergency contact) at Dr. Fayrene Fearing request as Dr. would like to have a discussion with family regarding POC. Daughter reports that she will be off from work at either 2:30 or 3:30 and will be heading straight here. CSW relayed information to Patient's bedside RN. CSW will continue to follow for disposition.   Lorayne Bender [place] ED/ [facility] Clinical Social Worker [phone]

## 2015-07-27 NOTE — ED Notes (Signed)
Per Morrie Sheldon Social worker daughter has been contacted by neurology request- will be here after work 2:30-3:30pm

## 2015-07-27 NOTE — Procedures (Signed)
ELECTROENCEPHALOGRAM REPORT  Date of Study: 07/27/2015  Patient's Name: Martin Cain MRN: [id number] Date of Birth: 09/13/1945  Referring Provider: Dr. Felicie Morn  Clinical History: This is a 70 year old man with a history of PSP with increasing confusion and tremors.  Medications: levETIRAcetam (KEPPRA) 500 MG tablet acetaminophen (TYLENOL) 325 MG tablet  allopurinol (ZYLOPRIM) 100 MG tablet amLODipine (NORVASC) 10 MG tablet atorvastatin (LIPITOR) 80 MG tablet carbidopa-levodopa (SINEMET) 25-100 MG tablet carvedilol (COREG) 25 MG tablet Cholecalciferol (VITAMIN D PO) colchicine 0.6 MG tablet [facility] ASPIRIN LOW DOSE 81 MG EC tablet Cyanocobalamin (VITAMIN B-12 PO) ferrous sulfate 325 (65 FE) MG tablet Fluticasone-Salmeterol (ADVAIR) 250-50 MCG/DOSE AEPB folic acid (FOLVITE) 1 MG tablet hydrALAZINE (APRESOLINE) 100 MG tablet LORazepam (ATIVAN) 1 MG tablet pantoprazole sodium (PROTONIX) 40 mg/20 mL PACK thiamine 100 MG tablet  Technical Summary: A multichannel digital EEG recording measured by the international 10-20 system with electrodes applied with paste and impedances below 5000 ohms performed as portable with EKG monitoring in a predominantly drowsy and asleep patient.  Hyperventilation and photic stimulation were not performed.  The digital EEG was referentially recorded, reformatted, and digitally filtered in a variety of bipolar and referential montages for optimal display.   Description: The patient is predominantly drowsy and asleep during the recording.  During brief period of wakefulness, there is a symmetric, medium voltage 7 Hz posterior dominant rhythm that poorly attenuates to eye opening and eye closure. This is admixed with a small amount of diffuse 4-5 Hz theta slowing of the waking background.  During drowsiness and sleep, there is an increase in theta slowing of the background.  Vertex waves and symmetric sleep spindles were seen.  Hyperventilation and photic  stimulation were not performed. He is noted to have jerking, left facial twitching, and confusion, with no epileptiform discharges seen.  There were no epileptiform discharges or electrographic seizures noted.  EKG lead was unremarkable.  Impression: This predominantly drowsy and asleep EEG is abnormal due to the presence of: 1. Slowing of the posterior dominant rhythm 2. Mild diffuse slowing of the waking background  Clinical Correlation of the above findings indicates diffuse cerebral dysfunction that is non-specific in etiology and can be seen with hypoxic/ischemic injury, toxic/metabolic encephalopathies, neurodegenerative disorders, or medication effect.  The patient is noted to have left facial twitching, jerking, and confusion with no associated EEG correlate. The absence of epileptiform discharges does not rule out a clinical diagnosis of epilepsy, simple partial seizures may have negative scalp EEG correlate.  Clinical correlation is advised.   Patrcia Dolly, M.D.

## 2015-07-27 NOTE — Consult Note (Signed)
NEURO HOSPITALIST CONSULT NOTE   Requestig physician: Dr. Fayrene Fearing   Reason for Consult: patient with PSP with increased shaking  HPI:                                                                                                                                          Martin Cain is an 70 y.o. male with a history of PSP(per patient, diagnosed at [facility] in [place] but very unclear history) who presents with abnormal movements. Patietn was seen in this hospital for similar presentation in 2016.  At that time we reviewed the extensive outside records from [place] and he was admitted in March of this year. He had an MRI that showed atrophy, EEG during a period of extensive myoclonus which did not show corresponding seizure activity, lumbar puncture which had negative AFB, fungal, bacterial cultures. I was unable to find the exact numbers, but the discharge summary reports his lumbar puncture results as "normal." it was felt at that time that this was most consistent with PSP. He was supposed to follow-up with neurology at [facility], but it does not appear that he ever did so. Since that time, he has continued to have increasing falls and has become nursing home bound. He continues to have prominent gaze palsy most affecting the vertical eye movements. I agree that this most likely represents PSP. At that time it was not thought to be seizure but could be related to his risperdone given that this medication can have a profound effect on people with PSP and I would not favor continuing it. Keppra was discontinued before discharge. I am unclear why but Eileen Stanford his primary care MD has started him back on this. Patient was brought to ED due to increasing confusion and tremor.   Past Medical History  Diagnosis Date  . Hypertension   . Hyperlipidemia   . CKD (chronic kidney disease)     a. Probable stage III.  Marland Kitchen CAD (coronary artery disease)     a. CABG 2000 in [place]. b. NSTEMI 04/2013  felt due to malignant hypertension (was instructed to f/u [facility] for stress test).  . Gout   . CHF (congestive heart failure) (HCC)     a. Echo 04/2013: EF 45-50%, mild AS, mild biatrial enlargement, sev LVH.  . Osteoarthritis     R knee  . Anemia   . 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 Cardiac surgery      2000; [place]  . Appendectomy    . Abdominal exploration surgery    . Tonsillectomy    . Flexible sigmoidoscopy Left 05/11/2015    Procedure: FLEXIBLE SIGMOIDOSCOPY;  Surgeon: Willis Modena, MD;  Location: [facility];  Service: Endoscopy;  Laterality:  Left;    Family History  Problem Relation Age of Onset  . Heart disease      No family history  . Cancer Father   . Hypertension Father   . Cancer Brother   . Cancer Sister   . Healthy Daughter      Social History:  reports that he has quit smoking. His smoking use included Cigarettes. He has a 20 pack-year smoking history. He has never used smokeless tobacco. He reports that he drinks alcohol. He reports that he does not use illicit drugs.  No Known Allergies  MEDICATIONS:                                                                                                                     No current facility-administered medications for this encounter.   Current Outpatient Prescriptions  Medication Sig Dispense Refill  . acetaminophen (TYLENOL) 325 MG tablet Take 2 tablets (650 mg total) by mouth every 6 (six) hours as needed for mild pain (or Fever >/= 101). 30 tablet 0  . albuterol (PROVENTIL HFA;VENTOLIN HFA) 108 (90 Base) MCG/ACT inhaler Inhale 2 puffs into the lungs every 6 (six) hours as needed for wheezing or shortness of breath. 1 Inhaler 3  . allopurinol (ZYLOPRIM) 100 MG tablet Take 1 tablet (100 mg total) by mouth daily. 30 tablet 5  . amLODipine (NORVASC) 10 MG tablet Take 1 tablet (10 mg total) by mouth daily. 30 tablet 5  . atorvastatin (LIPITOR) 80  MG tablet Take 1 tablet (80 mg total) by mouth daily. 30 tablet 5  . carbidopa-levodopa (SINEMET) 25-100 MG tablet Take 0.5 tablets by mouth 3 (three) times daily. 90 tablet 1  . carvedilol (COREG) 25 MG tablet Take 1 tablet (25 mg total) by mouth 2 (two) times daily. 60 tablet 5  . Cholecalciferol (VITAMIN D PO) Place 1,000 mg into feeding tube daily.     . colchicine 0.6 MG tablet Take 1 tablet (0.6 mg total) by mouth daily. 30 tablet 5  . CVS ASPIRIN LOW DOSE 81 MG EC tablet Take 81 mg by mouth daily.  0  . Cyanocobalamin (VITAMIN B-12 PO) Place 1,000 mg into feeding tube daily.     . ferrous sulfate 325 (65 FE) MG tablet Take 1 tablet (325 mg total) by mouth every morning. 30 tablet 5  . Fluticasone-Salmeterol (ADVAIR) 250-50 MCG/DOSE AEPB Inhale 1 puff into the lungs 2 (two) times daily. 60 each 1  . folic acid (FOLVITE) 1 MG tablet Place 1 mg into feeding tube daily.   3  . hydrALAZINE (APRESOLINE) 100 MG tablet Take 1 tablet (100 mg total) by mouth every morning. 30 tablet 5  . hydrocortisone (ANUSOL-HC) 2.5 % rectal cream Place rectally 4 (four) times daily. 30 g 0  . ipratropium-albuterol (DUONEB) 0.5-2.5 (3) MG/3ML SOLN Inhale 3 mLs into the lungs every 6 (six) hours as needed (shortness of breath). 360 mL 1  . isosorbide dinitrate (ISORDIL) 40 MG tablet Take 1 tablet (40 mg  total) by mouth daily. 30 tablet 5  . levETIRAcetam (KEPPRA) 500 MG tablet Take 1 tablet (500 mg total) by mouth 2 (two) times daily. 60 tablet 5  . LORazepam (ATIVAN) 1 MG tablet Take 1 tablet (1 mg total) by mouth every 6 (six) hours as needed for anxiety. 30 tablet 1  . Multiple Vitamin (MULTIVITAMIN WITH MINERALS) TABS tablet Take 1 tablet by mouth daily. (Patient taking differently: Place 1 tablet into feeding tube daily. )    . nitroGLYCERIN (NITROSTAT) 0.4 MG SL tablet Place 0.4 mg under the tongue every 5 (five) minutes as needed for chest pain.    . Nutritional Supplements (FEEDING SUPPLEMENT, JEVITY 1.5  CAL/FIBER,) LIQD Place 240 mLs into feeding tube 6 (six) times daily. 350 mL 0  . pantoprazole sodium (PROTONIX) 40 mg/20 mL PACK Place 40 mg into feeding tube daily. In 30 ml of applesauce.    . thiamine 100 MG tablet Take 1 tablet (100 mg total) by mouth daily. (Patient taking differently: Place 100 mg into feeding tube daily. )    . Water For Irrigation, Sterile (FREE WATER) SOLN Place 100 mLs into feeding tube 5 (five) times daily.        ROS:                                                                                                                                       History obtained from the patient wife  General ROS: negative for - chills, fatigue, fever, night sweats, weight gain or weight loss Psychological ROS: negative for - behavioral disorder, hallucinations, memory difficulties, mood swings or suicidal ideation Ophthalmic ROS: negative for - blurry vision, double vision, eye pain or loss of vision ENT ROS: negative for - epistaxis, nasal discharge, oral lesions, sore throat, tinnitus or vertigo Allergy and Immunology ROS: negative for - hives or itchy/watery eyes Hematological and Lymphatic ROS: negative for - bleeding problems, bruising or swollen lymph nodes Endocrine ROS: negative for - galactorrhea, hair pattern changes, polydipsia/polyuria or temperature intolerance Respiratory ROS: negative for - cough, hemoptysis, shortness of breath or wheezing Cardiovascular ROS: negative for - chest pain, dyspnea on exertion, edema or irregular heartbeat Gastrointestinal ROS: negative for - abdominal pain, diarrhea, hematemesis, nausea/vomiting or stool incontinence Genito-Urinary ROS: negative for - dysuria, hematuria, incontinence or urinary frequency/urgency Musculoskeletal ROS: negative for - joint swelling or muscular weakness Neurological ROS: as noted in HPI Dermatological ROS: negative for rash and skin lesion changes   Blood pressure 154/64, pulse 83, temperature  98.5 F (36.9 C), temperature source Rectal, resp. rate 15, SpO2 95 %.   Neurologic Examination:  HEENT-  Normocephalic, no lesions, without obvious abnormality.  Normal external eye and conjunctiva.  Normal TM's bilaterally.  Normal auditory canals and external ears. Normal external nose, mucus membranes and septum.  Normal pharynx. Cardiovascular- S1, S2 normal, pulses palpable throughout   Lungs- chest clear, no wheezing, rales, normal symmetric air entry Abdomen- normal findings: bowel sounds normal Extremities- no edema Lymph-no adenopathy palpable Musculoskeletal-no joint tenderness, deformity or swelling Skin-warm and dry, no hyperpigmentation, vitiligo, or suspicious lesions  Neurological Examination .  MENTAL STATUS including orientation to time. Blunted affect. Speech is hypophonic and spastic and dysarthric.  CRANIAL NERVES: II: Blinks to visual threat in all fields. Small pupils, limited fundoscopic exam.  III-IV-VI: Pupils equal round and reactive to light. Severe vertical gaze paresis, lateral gaze intact. Moderate left ptosis.  V: Normal facial sensation. Jaw jerk is present.  VII: Normal facial symmetry and movements. Myersons, palmomental, and Snout is present.  VIII: Normal hearing and vestibular function.  IX-X: Normal palatal movement.  XI: Normal shoulder shrug and head rotation.  XII: Normal tongue strength and range of motion, no deviation or fasciculation. Slowed tongue movements. Motor: Right : Upper extremity   5/5    Left:     Upper extremity   5/5  Lower extremity   5/5     Lower extremity   5/5 Decreased muscle bulk and increased tone throughout Sensory: Pinprick and light touch intact throughout, bilaterally Deep Tendon Reflexes: 3+ and symmetric throughout Plantars: Right: downgoing   Left: downgoing Cerebellar: normal  finger-to-nose, normal rapid alternating movements and normal heel-to-shin test Gait: not tested      Lab Results: Basic Metabolic Panel: No results for input(s): NA, K, CL, CO2, GLUCOSE, BUN, CREATININE, CALCIUM, MG, PHOS in the last 168 hours.  Liver Function Tests: No results for input(s): AST, ALT, ALKPHOS, BILITOT, PROT, ALBUMIN in the last 168 hours. No results for input(s): LIPASE, AMYLASE in the last 168 hours. No results for input(s): AMMONIA in the last 168 hours.  CBC: No results for input(s): WBC, NEUTROABS, HGB, HCT, MCV, PLT in the last 168 hours.  Cardiac Enzymes: No results for input(s): CKTOTAL, CKMB, CKMBINDEX, TROPONINI in the last 168 hours.  Lipid Panel: No results for input(s): CHOL, TRIG, HDL, CHOLHDL, VLDL, LDLCALC in the last 168 hours.  CBG: No results for input(s): GLUCAP in the last 168 hours.  Microbiology: Results for orders placed or performed during the hospital encounter of 05/08/15  MRSA PCR Screening     Status: None   Collection Time: 05/08/15  6:42 PM  Result Value Ref Range Status   MRSA by PCR NEGATIVE NEGATIVE Final    Comment:        The GeneXpert MRSA Assay (FDA approved for NASAL specimens only), is one component of a comprehensive MRSA colonization surveillance program. It is not intended to diagnose MRSA infection nor to guide or monitor treatment for MRSA infections.     Coagulation Studies: No results for input(s): LABPROT, INR in the last 72 hours.  Imaging: Dg Chest 2 View  07/27/2015  CLINICAL DATA:  Altered mental status, cough, congestion EXAM: CHEST  2 VIEW COMPARISON:  07/12/2015 FINDINGS: Cardiomediastinal silhouette is stable. No acute infiltrate or pleural effusion. No pulmonary edema. There is median sternotomy. Mild hyperinflation. IMPRESSION: No active disease. Status post median sternotomy. Cardiomegaly again noted. Mild hyperinflation. Electronically Signed   By: Natasha Mead M.D.   On: 07/27/2015 12:16    Ct Head Wo Contrast  07/27/2015  CLINICAL  DATA:  Altered mental status, not responsive to staff, history stroke, hypertension, coronary artery disease, CHF, gout, supranuclear palsy EXAM: CT HEAD WITHOUT CONTRAST TECHNIQUE: Contiguous axial images were obtained from the base of the skull through the vertex without intravenous contrast. COMPARISON:  05/16/2015 FINDINGS: Asymmetric positioning in gantry. Generalized atrophy. Normal ventricular morphology. No midline shift or mass effect. Small vessel chronic ischemic changes of deep cerebral white matter. 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 No definite intracranial hemorrhage, mass lesion or evidence acute infarction. No extra-axial fluid collections. Visualized paranasal sinuses and mastoid air cells clear. Skull intact. Atherosclerotic calcifications at the carotid siphons bilaterally. IMPRESSION: Atrophy with small vessel chronic ischemic changes of deep cerebral white matter. 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 No acute intracranial abnormalities. Electronically Signed   By: Ulyses Southward M.D.   On: 07/27/2015 12:16       Assessment and plan per attending neurologist  Felicie Morn PA-C [facility] [phone]  07/27/2015, 12:48 PM   Assessment/Plan:  70 YO male with PSP presenting with increased confusion and asterixis.  Likely secondary to toxic/metabbolic etiology.    Recommend:  1) Amantadine 100 MG in AM via PEG 2) D/C Sinemet 3) EEG 4) Infectious work up

## 2015-07-27 NOTE — ED Provider Notes (Signed)
CSN: [id number]     Arrival date & time 07/27/15  1046 History   First MD Initiated Contact with Patient 07/27/15 1051     Chief Complaint  Patient presents with  . Tremors      HPI  Patient presents for evaluation from home via EMS. Pale he lives with either a son and/or a daughter. EMS reports that he drinks heavily and daily. The patient is able to tell me that he drinks one or 2 Fifths of whisky over the course of a month but does not drink every day.  Apparently family was concerned that his tremor was worse today as well.   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 Has had a speech language pathology evaluations and recommended a thickened diet. Apparently still undergoing SLP treatment. Has had episodes of aspiration, has PEG/feeding tube.), chronic kidney disease, combined systolic and diastolic congestive heart failure, recent GI bleed with acute blood loss anemia requiring admission here this month, hypertension, malnutrition, and previous "stroke". No known seizure disorder.     Past Medical History  Diagnosis Date  . Hypertension   . Hyperlipidemia   . CKD (chronic kidney disease)     a. Probable stage III.  Marland Kitchen CAD (coronary artery disease)     a. CABG 2000 in [place]. b. NSTEMI 04/2013 felt due to malignant hypertension (was instructed to f/u [facility] for stress test).  . Gout   . CHF (congestive heart failure) (HCC)     a. Echo 04/2013: EF 45-50%, mild AS, mild biatrial enlargement, sev LVH.  . Osteoarthritis     R knee  . Anemia   . Progressive supranuclear palsies [facility])    Past Surgical History  Procedure Laterality Date  . Cardiac surgery      2000; [place]  . Appendectomy    . Abdominal exploration surgery    . Tonsillectomy    . Flexible sigmoidoscopy Left 05/11/2015    Procedure: FLEXIBLE SIGMOIDOSCOPY;  Surgeon: Willis Modena, MD;  Location: [facility];  Service:  Endoscopy;  Laterality: Left;   Family History  Problem Relation Age of Onset  . Heart disease      No family history  . Cancer Father   . Hypertension Father   . Cancer Brother   . Cancer Sister   . Healthy Daughter    Social History  Substance Use Topics  . Smoking status: Former Smoker -- 0.50 packs/day for 40 years    Types: Cigarettes  . Smokeless tobacco: Never Used  . Alcohol Use: 0.0 oz/week    0 Standard drinks or equivalent per week     Comment: previously admitted to 1 pint per day. denies use for 1 month.    Review of Systems  Constitutional: Positive for activity change. Negative for fever, chills, diaphoresis, appetite change and fatigue.  HENT: Positive for trouble swallowing and voice change. Negative for mouth sores and sore throat.   Eyes: Negative for visual disturbance.  Respiratory: Negative for cough, chest tightness, shortness of breath and wheezing.   Cardiovascular: Negative for chest pain.  Gastrointestinal: Negative for nausea, vomiting, abdominal pain, diarrhea and abdominal distention.  Endocrine: Negative for polydipsia, polyphagia and polyuria.  Genitourinary: Negative for dysuria, frequency and hematuria.  Musculoskeletal: Negative for gait problem.  Skin: Negative for color change, pallor and rash.  Neurological: Positive for tremors and weakness. Negative for dizziness, syncope, light-headedness and headaches.  Hematological: Does not bruise/bleed easily.  Psychiatric/Behavioral: Positive  for confusion. Negative for behavioral problems.      Allergies  Review of patient's allergies indicates no known allergies.  Home Medications   Prior to Admission medications   Medication Sig Start Date End Date Taking? Authorizing Provider  acetaminophen (TYLENOL) 325 MG tablet Take 2 tablets (650 mg total) by mouth every 6 (six) hours as needed for mild pain (or Fever >/= 101). 05/19/15   Belkys A Regalado, MD  albuterol (PROVENTIL HFA;VENTOLIN  HFA) 108 (90 Base) MCG/ACT inhaler Inhale 2 puffs into the lungs every 6 (six) hours as needed for wheezing or shortness of breath. 07/18/15   Pincus Sanes, MD  allopurinol (ZYLOPRIM) 100 MG tablet Take 1 tablet (100 mg total) by mouth daily. 07/18/15   Pincus Sanes, MD  amLODipine (NORVASC) 10 MG tablet Take 1 tablet (10 mg total) by mouth daily. 07/18/15   Pincus Sanes, MD  atorvastatin (LIPITOR) 80 MG tablet Take 1 tablet (80 mg total) by mouth daily. 07/18/15   Pincus Sanes, MD  carbidopa-levodopa (SINEMET) 25-100 MG tablet Take 0.5 tablets by mouth 3 (three) times daily. 05/07/15   Donika K Patel, DO  carvedilol (COREG) 25 MG tablet Take 1 tablet (25 mg total) by mouth 2 (two) times daily. 07/18/15   Pincus Sanes, MD  Cholecalciferol (VITAMIN D PO) Place 1,000 mg into feeding tube daily.     Historical Provider, MD  colchicine 0.6 MG tablet Take 1 tablet (0.6 mg total) by mouth daily. 07/18/15   Pincus Sanes, MD  [facility] ASPIRIN LOW DOSE 81 MG EC tablet Take 81 mg by mouth daily. 04/11/15   Historical Provider, MD  Cyanocobalamin (VITAMIN B-12 PO) Place 1,000 mg into feeding tube daily.     Historical Provider, MD  ferrous sulfate 325 (65 FE) MG tablet Take 1 tablet (325 mg total) by mouth every morning. 07/18/15   Pincus Sanes, MD  Fluticasone-Salmeterol (ADVAIR) 250-50 MCG/DOSE AEPB Inhale 1 puff into the lungs 2 (two) times daily. 07/18/15   Pincus Sanes, MD  folic acid (FOLVITE) 1 MG tablet Place 1 mg into feeding tube daily.  04/11/15   Historical Provider, MD  hydrALAZINE (APRESOLINE) 100 MG tablet Take 1 tablet (100 mg total) by mouth every morning. 07/18/15   Pincus Sanes, MD  hydrocortisone (ANUSOL-HC) 2.5 % rectal cream Place rectally 4 (four) times daily. 05/13/15   Kathlen Mody, MD  ipratropium-albuterol (DUONEB) 0.5-2.5 (3) MG/3ML SOLN Inhale 3 mLs into the lungs every 6 (six) hours as needed (shortness of breath). 07/18/15   Pincus Sanes, MD  isosorbide dinitrate (ISORDIL) 40 MG tablet  Take 1 tablet (40 mg total) by mouth daily. 07/18/15   Pincus Sanes, MD  levETIRAcetam (KEPPRA) 500 MG tablet Take 1 tablet (500 mg total) by mouth 2 (two) times daily. 07/18/15   Pincus Sanes, MD  LORazepam (ATIVAN) 1 MG tablet Take 1 tablet (1 mg total) by mouth every 6 (six) hours as needed for anxiety. 07/18/15   Pincus Sanes, MD  Multiple Vitamin (MULTIVITAMIN WITH MINERALS) TABS tablet Take 1 tablet by mouth daily. Patient taking differently: Place 1 tablet into feeding tube daily.  04/24/15   Maryann Mikhail, DO  nitroGLYCERIN (NITROSTAT) 0.4 MG SL tablet Place 0.4 mg under the tongue every 5 (five) minutes as needed for chest pain.    Historical Provider, MD  Nutritional Supplements (FEEDING SUPPLEMENT, JEVITY 1.5 CAL/FIBER,) LIQD Place 240 mLs into feeding tube 6 (six) times daily.  05/19/15   Belkys A Regalado, MD  pantoprazole sodium (PROTONIX) 40 mg/20 mL PACK Place 40 mg into feeding tube daily. In 30 ml of applesauce.    Historical Provider, MD  thiamine 100 MG tablet Take 1 tablet (100 mg total) by mouth daily. Patient taking differently: Place 100 mg into feeding tube daily.  04/24/15   Maryann Mikhail, DO  Water For Irrigation, Sterile (FREE WATER) SOLN Place 100 mLs into feeding tube 5 (five) times daily. 05/13/15   Kathlen Mody, MD   BP 168/66 mmHg  Pulse 77  Temp(Src) 98.5 F (36.9 C) (Rectal)  Resp 13  SpO2 99% Physical Exam  Constitutional:  Non-toxic appearance. He has a sickly appearance. No distress.  Thin somewhat cachectic-appearing male. Eyes are open and he is alert.  HENT:  No nystagmus. Temporal muscle wasting. Mucus membranes of the mouth are moist. He has right tongue and right lower facial tremors.  Cardiovascular:  Regular rhythm. Not tachycardic.  Pulmonary/Chest:  Usual cough. No abnormal or adventitial breath sounds. No increased work of breathing.  Abdominal:  Flat scaphoid abdomen. Feeding tube in left upper quadrant.  Musculoskeletal:  Frequent  muscle tremoring. Right greater than left to the 4 extremities.  Neurological:  Is communicative. Difficult to understand from his dysarthria.  Skin:  Grade 2 decubitus sacral.    ED Course  Procedures (including critical care time) Labs Review Labs Reviewed  COMPREHENSIVE METABOLIC PANEL - Abnormal; Notable for the following:    Sodium 150 (*)    BUN 36 (*)    Creatinine, Ser 2.35 (*)    Albumin 3.1 (*)    GFR calc non Af Amer 26 (*)    GFR calc Af Amer 30 (*)    All other components within normal limits  CBC - Abnormal; Notable for the following:    RBC 3.13 (*)    Hemoglobin 7.6 (*)    HCT 25.8 (*)    MCH 24.3 (*)    MCHC 29.5 (*)    RDW 21.1 (*)    All other components within normal limits  URINALYSIS, ROUTINE W REFLEX MICROSCOPIC (NOT AT [facility]) - Abnormal; Notable for the following:    Protein, ur 100 (*)    All other components within normal limits  CULTURE, BLOOD (ROUTINE X 2)  CULTURE, BLOOD (ROUTINE X 2)  URINE MICROSCOPIC-ADD ON  ETHANOL  AMMONIA  I-STAT CG4 LACTIC ACID, ED  I-STAT CG4 LACTIC ACID, ED    Imaging Review Dg Chest 2 View  07/27/2015  CLINICAL DATA:  Altered mental status, cough, congestion EXAM: CHEST  2 VIEW COMPARISON:  07/12/2015 FINDINGS: Cardiomediastinal silhouette is stable. No acute infiltrate or pleural effusion. No pulmonary edema. There is median sternotomy. Mild hyperinflation. IMPRESSION: No active disease. Status post median sternotomy. Cardiomegaly again noted. Mild hyperinflation. Electronically Signed   By: Natasha Mead M.D.   On: 07/27/2015 12:16   Ct Head Wo Contrast  07/27/2015  CLINICAL DATA:  Altered mental status, not responsive to staff, history stroke, hypertension, coronary artery disease, CHF, gout, supranuclear palsy EXAM: CT HEAD WITHOUT CONTRAST TECHNIQUE: Contiguous axial images were obtained from the base of the skull through the vertex without intravenous contrast. COMPARISON:  05/16/2015 FINDINGS: Asymmetric  positioning in gantry. Generalized atrophy. Normal ventricular morphology. No midline shift or mass effect. Small vessel chronic ischemic changes of deep cerebral white matter. 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 No definite intracranial hemorrhage, mass lesion or evidence acute infarction. No extra-axial  fluid collections. Visualized paranasal sinuses and mastoid air cells clear. Skull intact. Atherosclerotic calcifications at the carotid siphons bilaterally. IMPRESSION: Atrophy with small vessel chronic ischemic changes of deep cerebral white matter. 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 No acute intracranial abnormalities. Electronically Signed   By: Ulyses Southward M.D.   On: 07/27/2015 12:16   I have personally reviewed and evaluated these images and lab results as part of my medical decision-making.   EKG Interpretation None      MDM   Final diagnoses:  Supranuclear palsies, progressive (HCC)  Encephalopathy    I await his family's arrival to try to understand the main reason for the daily of his visit today. He does have tremoring. Also history of supranuclear palsy. Tremors could be exacerbated by diffuse disease progression, and/or alcohol withdrawl. CT the head is requested. I discussed the case with Dr. Lavon Paganini of neurology. I have asked him to see him in consult prior to benzodiazepine administration. IV Ativan may suppressed the tremor irregardless of its etiology, but may blunt his LOC and exam.  13:40:  Appreciate Neurology input. Recommend Infectious/encephalopathy workup, 2/2 asterixis as sign of acute encephalopathy    Rolland Porter, MD 07/27/15 [phone]

## 2015-07-27 NOTE — H&P (Signed)
Family Medicine Teaching [facility] Admission History and Physical Service Pager: [phone]  Patient name: Martin Cain Medical record number: [id number] Date of birth: 11-01-1945 Age: 70 y.o. Gender: male  Primary Care Provider: Pincus Sanes, MD Consultants: Neurology Code Status: Full (per discussion on admission)  Chief Complaint: increased "shaking"  Assessment and Plan: Martin Cain is a 70 y.o. male presenting with increased shaking . PMH is significant for COPD, CAD, Chronic combined systolic and diastolic congestive heart failure, progressive supranuclear palsies, gout, HTN, HLD.   Confusion and increased tremor (history of progressive supranuclear palsy and tremors): Concerned that his encephalopathy may be related to infectious versus metabolic. Neurology has been consultative has stopped his Sinemet. EEG is artier performed which shows slowing but is not specific for any etiology. His EtOH level <5 with has hx of alcohol abuse. His ammonia is normal. Metabolic panel is showing hypernatremia. No suggestion of infection with chest x-ray and urinalysis normal. CT of his head is only showing old infarcts. He appears to be alert and oriented not complaining of any headache to suggest a reason to perform a lumbar puncture. He has a normal lactic acid no leukocytosis and has been afebrile so withholding initiating antibiotics for now. Unsure if this is an extension of his supranuclear palsy - Admitted to telemetry, Dr. McDiarmid attending - Blood cultures taken - CBC in the morning - If develops fevers would consider starting empiric antibiotics - Would consider lumbar puncture if develops fear - Appreciate neurology recommendations: started amantadine 100 mg  - continue keppra   Hypernatremia: May be contributing to his principal problem. His sodium baseline appears to be around 140 - D5 50 mL per hour - home free water 100 mL 5 times daily   Dysphagia: there is a growing  concern for aspiration.  - NPO  - tube feeds  - SLP  - protonix   Normocytic anemia: admitted in November 2016 for a GI bleed. Hgb 7.6 with baseline around 9 - cbc  - ferrous sulfate   Protein Calorie Malnutrition, Severe: His weight seems to be around his baseline ~120 lbs - tube feeds - nutrition   Chronic Combined systolic and diastolic congestive heart failure: Doesn't appear to be volume overloaded on exam and his weight is at or around his baseline. ECHO EF 35% with grade 1 DD.  - Gently add fluids - continue imdur and coreg   CKD III: Scr baseline around 2. Mild elevation today.  - giving fluids   CAD/HLD/HTN: appears stable  - continue lipitor, hydralazine,   COPD: Chest x-ray looks normal with no wheezing or sputum production - Continue home meds  Gout: No suggestion of acute flare - Continue Colchicine and allopurinol   Alcohol Abuse: His alcohol level is normal and no suggestion of withdrawal - Thiamine and folate  FEN/GI: D5 at 50 mL per hour, nothing by mouth Prophylaxis: SCD's  Disposition: Admitted to telemetry for continued monitoring  History of Present Illness:  Martin Cain is a 70 y.o. male presenting with worsening tremors. Pt states that the tremors in his left arm are new . Today around 10/11am  he felt "off balance" and his speech has changed today. Pt states he feels "ok" except for the shaking. Difficult to ellicit history from patient due to change speech.   Patient is poor historian so his daughter was called. She reports that the family has noticed an increase in the patient's overall shaking of his body. She denies any fevers or  chills. There has been some concern about aspiration as patient still wants to be able to eat soft foods. She reports that he is able to ambulate with a walker but has had increasing falls.  Review Of Systems: Per HPI with the following additions: See history of present illness Otherwise the remainder of the systems  were negative.  Patient Active Problem List   Diagnosis Date Noted  . Encephalopathy 07/27/2015  . Cataract 06/14/2015  . CAD (coronary artery disease) 06/14/2015  . COPD (chronic obstructive pulmonary disease) (HCC) 06/14/2015  . Tremors of nervous system 05/16/2015  . AKI (acute kidney injury) (HCC) 05/16/2015  . Hyperkalemia 05/16/2015  . Chronic combined systolic and diastolic congestive heart failure (HCC) 05/16/2015  . Normocytic anemia 05/16/2015  . Supranuclear palsies, progressive (HCC) 05/16/2015  . Essential hypertension 05/16/2015  . CKD (chronic kidney disease), stage III 05/16/2015  . Progressive supranuclear palsies (HCC)   . Pressure ulcer 05/09/2015  . Protein-calorie malnutrition, severe 05/09/2015  . Anemia 05/08/2015  . Absolute anemia   . Dysphagia   . Palliative care encounter   . Symptomatic anemia 04/17/2015  . Malnutrition of moderate degree (HCC) 06/21/2013  . HTN (hypertension) 06/20/2013  . Acute on chronic renal failure (HCC) 05/30/2013  . Acute on chronic combined systolic and diastolic CHF (congestive heart failure) (HCC) 05/28/2013  . Acute exacerbation of CHF (congestive heart failure) (HCC) 05/27/2013  . Elevated troponin 05/27/2013  . Gout flare 05/27/2013  . HTN (hypertension), malignant 05/27/2013    Past Medical History: Past Medical History  Diagnosis Date  . Hypertension   . Hyperlipidemia   . CKD (chronic kidney disease)     a. Probable stage III.  Marland Kitchen CAD (coronary artery disease)     a. CABG 2000 in [place]. b. NSTEMI 04/2013 felt due to malignant hypertension (was instructed to f/u ATL for stress test).  . Gout   . CHF (congestive heart failure) (HCC)     a. Echo 04/2013: EF 45-50%, mild AS, mild biatrial enlargement, sev LVH.  . Osteoarthritis     R knee  . Anemia   . Progressive supranuclear palsies [facility])     Past Surgical History: Past Surgical History  Procedure Laterality Date  . Cardiac surgery      2000; [place]   . Appendectomy    . Abdominal exploration surgery    . Tonsillectomy    . Flexible sigmoidoscopy Left 05/11/2015    Procedure: FLEXIBLE SIGMOIDOSCOPY;  Surgeon: Willis Modena, MD;  Location: [facility] ENDOSCOPY;  Service: Endoscopy;  Laterality: Left;    Social History: Social History  Substance Use Topics  . Smoking status: Former Smoker -- 0.50 packs/day for 40 years    Types: Cigarettes  . Smokeless tobacco: Never Used  . Alcohol Use: 0.0 oz/week    0 Standard drinks or equivalent per week     Comment: previously admitted to 1 pint per day. denies use for 1 month.   Additional social history: Denies alcohol, tobacco, or illicit drug use Please also refer to relevant sections of EMR.  Family History: Family History  Problem Relation Age of Onset  . Heart disease      No family history  . Cancer Father   . Hypertension Father   . Cancer Brother   . Cancer Sister   . Healthy Daughter     Allergies and Medications: No Known Allergies No current facility-administered medications on file prior to encounter.   Current Outpatient Prescriptions on File Prior to Encounter  Medication Sig Dispense Refill  . acetaminophen (TYLENOL) 325 MG tablet Take 2 tablets (650 mg total) by mouth every 6 (six) hours as needed for mild pain (or Fever >/= 101). 30 tablet 0  . albuterol (PROVENTIL HFA;VENTOLIN HFA) 108 (90 Base) MCG/ACT inhaler Inhale 2 puffs into the lungs every 6 (six) hours as needed for wheezing or shortness of breath. 1 Inhaler 3  . allopurinol (ZYLOPRIM) 100 MG tablet Take 1 tablet (100 mg total) by mouth daily. 30 tablet 5  . amLODipine (NORVASC) 10 MG tablet Take 1 tablet (10 mg total) by mouth daily. 30 tablet 5  . atorvastatin (LIPITOR) 80 MG tablet Take 1 tablet (80 mg total) by mouth daily. 30 tablet 5  . carbidopa-levodopa (SINEMET) 25-100 MG tablet Take 0.5 tablets by mouth 3 (three) times daily. 90 tablet 1  . carvedilol (COREG) 25 MG tablet Take 1 tablet (25 mg total)  by mouth 2 (two) times daily. 60 tablet 5  . Cholecalciferol (VITAMIN D PO) Place 1,000 mg into feeding tube daily.     . colchicine 0.6 MG tablet Take 1 tablet (0.6 mg total) by mouth daily. 30 tablet 5  . [employer] ASPIRIN LOW DOSE 81 MG EC tablet Take 81 mg by mouth daily.  0  . Cyanocobalamin (VITAMIN B-12 PO) Place 1,000 mg into feeding tube daily.     . ferrous sulfate 325 (65 FE) MG tablet Take 1 tablet (325 mg total) by mouth every morning. 30 tablet 5  . Fluticasone-Salmeterol (ADVAIR) 250-50 MCG/DOSE AEPB Inhale 1 puff into the lungs 2 (two) times daily. 60 each 1  . folic acid (FOLVITE) 1 MG tablet Place 1 mg into feeding tube daily.   3  . hydrALAZINE (APRESOLINE) 100 MG tablet Take 1 tablet (100 mg total) by mouth every morning. 30 tablet 5  . hydrocortisone (ANUSOL-HC) 2.5 % rectal cream Place rectally 4 (four) times daily. 30 g 0  . ipratropium-albuterol (DUONEB) 0.5-2.5 (3) MG/3ML SOLN Inhale 3 mLs into the lungs every 6 (six) hours as needed (shortness of breath). 360 mL 1  . isosorbide dinitrate (ISORDIL) 40 MG tablet Take 1 tablet (40 mg total) by mouth daily. 30 tablet 5  . levETIRAcetam (KEPPRA) 500 MG tablet Take 1 tablet (500 mg total) by mouth 2 (two) times daily. 60 tablet 5  . LORazepam (ATIVAN) 1 MG tablet Take 1 tablet (1 mg total) by mouth every 6 (six) hours as needed for anxiety. 30 tablet 1  . Multiple Vitamin (MULTIVITAMIN WITH MINERALS) TABS tablet Take 1 tablet by mouth daily. (Patient taking differently: Place 1 tablet into feeding tube daily. )    . nitroGLYCERIN (NITROSTAT) 0.4 MG SL tablet Place 0.4 mg under the tongue every 5 (five) minutes as needed for chest pain.    . Nutritional Supplements (FEEDING SUPPLEMENT, JEVITY 1.5 CAL/FIBER,) LIQD Place 240 mLs into feeding tube 6 (six) times daily. 350 mL 0  . pantoprazole sodium (PROTONIX) 40 mg/20 mL PACK Place 40 mg into feeding tube daily. In 30 ml of applesauce.    . thiamine 100 MG tablet Take 1 tablet (100 mg  total) by mouth daily. (Patient taking differently: Place 100 mg into feeding tube daily. )    . Water For Irrigation, Sterile (FREE WATER) SOLN Place 100 mLs into feeding tube 5 (five) times daily.      Objective: BP 166/59 mmHg  Pulse 77  Temp(Src) 97.8 F (36.6 C) (Oral)  Resp 20  SpO2 100% Exam: General: No  acute distress, frail appearing elderly man Eyes: Extraocular movements intact, pupils equal and reactive ENTM: Tacky mucous membranes, tongue with red discoloration Neck: Supple Cardiovascular: S1-S2, regular rate and rhythm, 2/6 systolic murmur Respiratory: Clear to auscultation bilaterally, normal effort, no wheezes or crackles Abdomen: Tube in place that is without infection, soft abdomen, nontender nondistended, no hepatosplenomegaly, old vertical scar on anterior abdomen MSK: Able to move extremities freely Skin: No rashes Neuro: Normal finger to nose testing bilaterally, normal grip strength, 5 out of 5 strength in upper and lower extremities, normal shrug and head rotation, Psych: Alert and oriented  Labs and Imaging: CBC BMET   Recent Labs Lab 07/27/15 1215  WBC 5.8  HGB 7.6*  HCT 25.8*  PLT 283    Recent Labs Lab 07/27/15 1215  NA 150*  K 4.3  CL 111  CO2 32  BUN 36*  CREATININE 2.35*  GLUCOSE 84  CALCIUM 9.0     Dg Chest 2 View  07/27/2015  CLINICAL DATA:  Altered mental status, cough, congestion EXAM: CHEST  2 VIEW COMPARISON:  07/12/2015 FINDINGS: Cardiomediastinal silhouette is stable. No acute infiltrate or pleural effusion. No pulmonary edema. There is median sternotomy. Mild hyperinflation. IMPRESSION: No active disease. Status post median sternotomy. Cardiomegaly again noted. Mild hyperinflation. Electronically Signed   By: Natasha Mead M.D.   On: 07/27/2015 12:16   Ct Head Wo Contrast  07/27/2015  CLINICAL DATA:  Altered mental status, not responsive to staff, history stroke, hypertension, coronary artery disease, CHF, gout, supranuclear  palsy EXAM: CT HEAD WITHOUT CONTRAST TECHNIQUE: Contiguous axial images were obtained from the base of the skull through the vertex without intravenous contrast. COMPARISON:  05/16/2015 FINDINGS: Asymmetric positioning in gantry. Generalized atrophy. Normal ventricular morphology. No midline shift or mass effect. Small vessel chronic ischemic changes of deep cerebral white matter. 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 No definite intracranial hemorrhage, mass lesion or evidence acute infarction. No extra-axial fluid collections. Visualized paranasal sinuses and mastoid air cells clear. Skull intact. Atherosclerotic calcifications at the carotid siphons bilaterally. IMPRESSION: Atrophy with small vessel chronic ischemic changes of deep cerebral white matter. 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 No acute intracranial abnormalities. Electronically Signed   By: Ulyses Southward M.D.   On: 07/27/2015 12:16   Urinalysis    Component Value Date/Time   COLORURINE YELLOW 07/27/2015 1314   APPEARANCEUR CLEAR 07/27/2015 1314   LABSPEC 1.014 07/27/2015 1314   PHURINE 7.0 07/27/2015 1314   GLUCOSEU NEGATIVE 07/27/2015 1314   HGBUR NEGATIVE 07/27/2015 1314   BILIRUBINUR NEGATIVE 07/27/2015 1314   KETONESUR NEGATIVE 07/27/2015 1314   PROTEINUR 100* 07/27/2015 1314   UROBILINOGEN 0.2 06/19/2013 2154   NITRITE NEGATIVE 07/27/2015 1314   LEUKOCYTESUR NEGATIVE 07/27/2015 1314    Myra Rude, MD 07/27/2015, 8:15 PM PGY-3, [facility] FPTS Intern pager: [phone], text pages welcome

## 2015-07-27 NOTE — ED Notes (Signed)
Pt reports to the ED for eval of altered mental status and tremors. Per EMS pt is from home and his family member reports that he drinks heavily, everyday and has not had any ETOH today. Pt also has generalized tremors. Pt can normally carry on a conversation and walk but at this time he is having garbled speech and confusion. Pt also febrile en route at 99.9. EMS also noted pinpoint pupils. 12 lead showed LVH. CBG 140 mg/dl. Pt alert. Resp e/u and skin hot and dry.

## 2015-07-28 DIAGNOSIS — G231 Progressive supranuclear ophthalmoplegia [Steele-Richardson-Olszewski]: Secondary | ICD-10-CM | POA: Diagnosis not present

## 2015-07-28 DIAGNOSIS — G9340 Encephalopathy, unspecified: Secondary | ICD-10-CM | POA: Diagnosis not present

## 2015-07-28 DIAGNOSIS — E870 Hyperosmolality and hypernatremia: Secondary | ICD-10-CM | POA: Diagnosis not present

## 2015-07-28 DIAGNOSIS — E440 Moderate protein-calorie malnutrition: Secondary | ICD-10-CM

## 2015-07-28 DIAGNOSIS — R251 Tremor, unspecified: Secondary | ICD-10-CM | POA: Diagnosis not present

## 2015-07-28 LAB — GLUCOSE, CAPILLARY
GLUCOSE-CAPILLARY: 107 mg/dL — AB (ref 65–99)
GLUCOSE-CAPILLARY: 94 mg/dL (ref 65–99)
Glucose-Capillary: 170 mg/dL — ABNORMAL HIGH (ref 65–99)
Glucose-Capillary: 177 mg/dL — ABNORMAL HIGH (ref 65–99)

## 2015-07-28 LAB — BASIC METABOLIC PANEL
ANION GAP: 7 (ref 5–15)
BUN: 36 mg/dL — ABNORMAL HIGH (ref 6–20)
CO2: 29 mmol/L (ref 22–32)
Calcium: 8.8 mg/dL — ABNORMAL LOW (ref 8.9–10.3)
Chloride: 111 mmol/L (ref 101–111)
Creatinine, Ser: 2.07 mg/dL — ABNORMAL HIGH (ref 0.61–1.24)
GFR calc Af Amer: 35 mL/min — ABNORMAL LOW (ref >60)
GFR, EST NON AFRICAN AMERICAN: 31 mL/min — AB (ref >60)
GLUCOSE: 55 mg/dL — AB (ref 65–99)
POTASSIUM: 4.8 mmol/L (ref 3.5–5.1)
Sodium: 147 mmol/L — ABNORMAL HIGH (ref 135–145)

## 2015-07-28 LAB — CBC
HCT: 26.7 % — ABNORMAL LOW (ref 39.0–52.0)
HEMOGLOBIN: 7.8 g/dL — AB (ref 13.0–17.0)
MCH: 24.5 pg — AB (ref 26.0–34.0)
MCHC: 29.2 g/dL — AB (ref 30.0–36.0)
MCV: 83.7 fL (ref 78.0–100.0)
Platelets: 286 10*3/uL (ref 150–400)
RBC: 3.19 MIL/uL — AB (ref 4.22–5.81)
RDW: 21.3 % — ABNORMAL HIGH (ref 11.5–15.5)
WBC: 4.4 10*3/uL (ref 4.0–10.5)

## 2015-07-28 MED ORDER — INSULIN ASPART 100 UNIT/ML ~~LOC~~ SOLN
0.0000 [IU] | Freq: Three times a day (TID) | SUBCUTANEOUS | Status: DC
  Administered 2015-07-29 – 2015-07-30 (×4): 1 [IU] via SUBCUTANEOUS

## 2015-07-28 MED ORDER — COLCHICINE 0.6 MG PO TABS
0.3000 mg | ORAL_TABLET | Freq: Every day | ORAL | Status: DC
  Administered 2015-07-29 – 2015-07-31 (×3): 0.3 mg via ORAL
  Filled 2015-07-28 (×3): qty 1

## 2015-07-28 MED ORDER — DEXTROSE 5 % IV SOLN: INTRAVENOUS | Status: DC

## 2015-07-28 MED ORDER — FERROUS SULFATE 220 (44 FE) MG/5ML PO ELIX
220.0000 mg | ORAL_SOLUTION | Freq: Every day | ORAL | Status: DC
  Administered 2015-07-29 – 2015-07-31 (×3): 220 mg via ORAL
  Filled 2015-07-28 (×5): qty 5

## 2015-07-28 MED ORDER — JEVITY 1.2 CAL PO LIQD: 1000.0000 mL | ORAL | Status: DC

## 2015-07-28 MED ORDER — VITAMIN B-1 100 MG PO TABS
100.0000 mg | ORAL_TABLET | Freq: Every day | ORAL | Status: DC
  Administered 2015-07-28: 100 mg via ORAL

## 2015-07-28 NOTE — Evaluation (Signed)
Physical Therapy Evaluation Patient Details Name: Martin Cain MRN: [id number] DOB: 11-13-1945 Today's Date: 07/28/2015   History of Present Illness  70 yo male readmitted with tremors of the nervous system related to progressive supranuclear palsy, hx of severe malnutrition. PMH of HTN, HLD, CKD stage 4, CAD, gout, CHF, dysphagia, and ongoing alcohol abuse.  Clinical Impression  Baseline mobility and home environment unknown.  Pt with high fall risk secondary to poor insight into deficits, unaware of his situation and surroundings (thinks he is at home).  Pt with difficulty advancing either foot during transfer, therefore needs tactile cues for weight shifting.  Therapy will continue to follow to assist with discharge planning and follow up recommendations.     Follow Up Recommendations SNF;Supervision/Assistance - 24 hour    Equipment Recommendations  None recommended by PT (to be determined as discharge plan develops)    Recommendations for Other Services       Precautions / Restrictions Precautions Precautions: Fall;Other (comment) Precaution Comments: PEG, NPO, h/o ETOH abuse, decr vision Restrictions Weight Bearing Restrictions: No      Mobility  Bed Mobility Overal bed mobility: Needs Assistance Bed Mobility: Supine to Sit     Supine to sit: Mod assist     General bed mobility comments: mod cues for sequencing  Transfers Overall transfer level: Needs assistance Equipment used: 1 person hand held assist Transfers: Sit to/from [employer] Sit to Stand: Min assist Stand pivot transfers: Mod assist       General transfer comment: pt had hands on therapist's elbows, tactile cues for lateral weight shifting  Ambulation/Gait                Stairs            Wheelchair Mobility    Modified Rankin (Stroke Patients Only)       Balance Overall balance assessment: Needs assistance Sitting-balance support: Feet supported;Bilateral  upper extremity supported Sitting balance-Leahy Scale: Fair Sitting balance - Comments: tends to drift posteriorly with onset of fatigue, able to sustain position with min pertubation with cues Postural control: Posterior lean Standing balance support: Bilateral upper extremity supported Standing balance-Leahy Scale: Poor                               Pertinent Vitals/Pain Pain Assessment: No/denies pain    Home Living Family/patient expects to be discharged to:: Skilled nursing facility                      Prior Function Level of Independence: Needs assistance (reports he was using a walker)         Comments: pt poor historian, unable to fully describe     Hand Dominance        Extremity/Trunk Assessment   Upper Extremity Assessment: Generalized weakness           Lower Extremity Assessment: Generalized weakness      Cervical / Trunk Assessment: Normal  Communication   Communication: Expressive difficulties  Cognition Arousal/Alertness: Lethargic Behavior During Therapy: Flat affect Overall Cognitive Status: Difficult to assess       Memory: Decreased recall of precautions;Decreased short-term memory              General Comments General comments (skin integrity, edema, etc.): PEG tube, difficulty wtih visual scanning    Exercises        Assessment/Plan    PT Assessment Patient needs  continued PT services  PT Diagnosis Difficulty walking;Generalized weakness;Altered mental status   PT Problem List Decreased strength;Decreased activity tolerance;Decreased balance;Decreased mobility;Decreased cognition;Decreased knowledge of use of DME;Decreased safety awareness;Decreased knowledge of precautions  PT Treatment Interventions DME instruction;Gait training;Functional mobility training;Therapeutic activities;Therapeutic exercise;Balance training;Neuromuscular re-education;Cognitive remediation;Patient/family education   PT Goals  (Current goals can be found in the Care Plan section) Acute Rehab PT Goals Patient Stated Goal: unable to state PT Goal Formulation: Patient unable to participate in goal setting Time For Goal Achievement: 08/11/15 Potential to Achieve Goals: Good    Frequency Min 2X/week   Barriers to discharge Other (comment) (unknown caregiver support)      Co-evaluation               End of Session Equipment Utilized During Treatment: Gait belt Activity Tolerance: Patient limited by lethargy Patient left: in chair;with call bell/phone within reach;with chair alarm set;Other (comment) (requested soft call bell as pt unable to demo use of button) Nurse Communication: Mobility status;Precautions         Time: 1430-1500 PT Time Calculation (min) (ACUTE ONLY): 30 min   Charges:   PT Evaluation $PT Eval High Complexity: 1 Procedure PT Treatments $Therapeutic Activity: 8-22 mins   PT G CodesNestor Lewandowsky, PT [phone]  Kipling Graser 07/28/2015, 3:21 PM

## 2015-07-28 NOTE — Progress Notes (Signed)
Initial Nutrition Assessment  DOCUMENTATION CODES:   Severe malnutrition in context of chronic illness, Underweight  INTERVENTION:   -Continue bolus feeds of 240 ml of Jevity 1.5 via PEG  Tube feeding regimen provides 2160 kcal (100% of needs), 92 grams of protein, and 1094 ml of H2O. (1594 ml fluid with inclusion of free water flushes).  -If no IVFS, consider 100 ml free water flush 6 times daily   NUTRITION DIAGNOSIS:   Malnutrition related to chronic illness as evidenced by severe depletion of body fat, severe depletion of muscle mass.  GOAL:   Patient will meet greater than or equal to 90% of their needs  MONITOR:   Labs, Weight trends, TF tolerance, Skin, I & O's  REASON FOR ASSESSMENT:   Malnutrition Screening Tool, Consult  (severe protein malnutrition)  ASSESSMENT:   Martin Cain is a 70 y.o. male presenting with increased shaking . PMH is significant for COPD, CAD, Chronic combined systolic and diastolic congestive heart failure, progressive supranuclear palsies, gout, HTN, HLD.   Pt admitted with confusion and increased tremors. Pt with hx of progressive subranuclear palsy.   Difficult to obtain hx from pt due to garbled speech. Per SLP note, pt reports he was on a regular diet with thin liquids PTA, however, SLP recommending NPO with nutrition via alternative means. Pt with pre-existing PEG.   Home TF regimen is 240 ml of Jevity 1.5 6 times daily with 100 ml flush 5 times daily via PEG. Regimen provides 2160 kcals, 92 grams protein, and 1094 ml fluid (1594 ml fluid with inclusion of free water flushes). This meets >100% of pt's estimated kcal and protein intake, but only 89% of pt's estimated fluid needs. However, pt is currently receiving IVFs.   Nutrition-Focused physical exam completed. Findings are moderate to severe fat depletion, moderate to severe muscle depletion, and no edema.   Labs reviewed: CBGS: 170.   Diet Order:  Diet NPO time specified  Skin:   Reviewed, no issues  Last BM:  PTA  Height:   Ht Readings from Last 1 Encounters:  06/18/15  (1.753 m)    Weight:   Wt Readings from Last 1 Encounters:  07/28/15 115 lb 11.9 oz (52.5 kg)    Ideal Body Weight:  72.7 kg  BMI:  Body mass index is 17.08 kg/(m^2).  Estimated Nutritional Needs:   Kcal:  1800-2000  Protein:  80-95 grams  Fluid:  1.8-2.0 L  EDUCATION NEEDS:   No education needs identified at this time  Mckay Tegtmeyer A. Mayford Knife, RD, LDN, CDE Pager: [phone] After hours Pager: [phone]

## 2015-07-28 NOTE — Progress Notes (Signed)
Interval History:                                                                                                                      Martin Cain is an 70 y.o. male patient who presented with altered mental status. He has severe dysphagia and motor rigidity due to Parkinson's disease. Also known alcohol abuse history. EEG showed evidence of encephalopathy, no abnormal epileptiform discharges.  Clinically he appears to be in delirium, has some tremors in his upper and lower extremities which have worsened since yesterday. Does have a resting tremor at baseline.   No family members were available at bedside. Nursing staff informed that they have not seen any family members visiting him today.    Past Medical History: Past Medical History  Diagnosis Date  . Hypertension   . Hyperlipidemia   . CKD (chronic kidney disease)     a. Probable stage III.  Marland Kitchen CAD (coronary artery disease)     a. CABG 2000 in [place]. b. NSTEMI 04/2013 felt due to malignant hypertension (was instructed to f/u ATL for stress test).  . Gout   . CHF (congestive heart failure) (HCC)     a. Echo 04/2013: EF 45-50%, mild AS, mild biatrial enlargement, sev LVH.  . Osteoarthritis     R knee  . Anemia   . Progressive supranuclear palsies [facility])     Past Surgical History  Procedure Laterality Date  . Cardiac surgery      2000; [place]  . Appendectomy    . Abdominal exploration surgery    . Tonsillectomy    . Flexible sigmoidoscopy Left 05/11/2015    Procedure: FLEXIBLE SIGMOIDOSCOPY;  Surgeon: Willis Modena, MD;  Location: [facility];  Service: Endoscopy;  Laterality: Left;    Family History: Family History  Problem Relation Age of Onset  . Heart disease      No family history  . Cancer Father   . Hypertension Father   . Cancer Brother   . Cancer Sister   . Healthy Daughter     Social History:   reports that he has quit smoking. His smoking use included Cigarettes. He has a 20 pack-year smoking  history. He has never used smokeless tobacco. He reports that he drinks alcohol. He reports that he does not use illicit drugs.  Allergies:  No Known Allergies   Medications:  Current facility-administered medications:  .  acetaminophen (TYLENOL) tablet 650 mg, 650 mg, Oral, Q6H PRN, Myra Rude, MD .  albuterol (PROVENTIL) (2.5 MG/3ML) 0.083% nebulizer solution 3 mL, 3 mL, Inhalation, Q6H PRN, Myra Rude, MD .  allopurinol (ZYLOPRIM) tablet 100 mg, 100 mg, Oral, Daily, Myra Rude, MD, 100 mg at 07/28/15 0908 .  amLODipine (NORVASC) tablet 10 mg, 10 mg, Oral, Daily, Myra Rude, MD, 10 mg at 07/28/15 0910 .  atorvastatin (LIPITOR) tablet 80 mg, 80 mg, Oral, q1800, Myra Rude, MD, 80 mg at 07/28/15 1723 .  carvedilol (COREG) tablet 25 mg, 25 mg, Oral, BID, Myra Rude, MD, 25 mg at 07/28/15 0911 .  [START ON 07/29/2015] colchicine tablet 0.3 mg, 0.3 mg, Oral, Daily, Todd D McDiarmid, MD .  dextrose 5 % solution, , Intravenous, Continuous, Myra Rude, MD, Last Rate: 50 mL/hr at 07/28/15 2019 .  feeding supplement (JEVITY 1.5 CAL/FIBER) liquid 240 mL, 240 mL, Per Tube, 6 X Daily, Myra Rude, MD, 240 mL at 07/28/15 2021 .  [START ON 07/29/2015] ferrous sulfate 220 (44 Fe) MG/5ML solution 220 mg, 220 mg, Oral, Q breakfast, Beaulah Dinning, MD .  free water 100 mL, 100 mL, Per Tube, 5 X Daily, Myra Rude, MD, 100 mL at 07/28/15 1800 .  hydrALAZINE (APRESOLINE) tablet 100 mg, 100 mg, Oral, Daily, Myra Rude, MD, 100 mg at 07/28/15 0909 .  hydrocortisone (ANUSOL-HC) 2.5 % rectal cream, , Rectal, QID, Myra Rude, MD .  insulin aspart (novoLOG) injection 0-9 Units, 0-9 Units, Subcutaneous, TID WC, Leighton Roach McDiarmid, MD, 0 Units at 07/28/15 1200 .  ipratropium-albuterol (DUONEB) 0.5-2.5 (3) MG/3ML nebulizer solution 3 mL, 3  mL, Inhalation, Q6H PRN, Myra Rude, MD .  isosorbide dinitrate (ISORDIL) tablet 40 mg, 40 mg, Oral, Daily, Myra Rude, MD, 40 mg at 07/28/15 0910 .  levETIRAcetam (KEPPRA) tablet 500 mg, 500 mg, Oral, BID, Myra Rude, MD, 500 mg at 07/28/15 0909 .  LORazepam (ATIVAN) tablet 1 mg, 1 mg, Oral, Q6H PRN, Myra Rude, MD .  mometasone-formoterol [facility]) 100-5 MCG/ACT inhaler 2 puff, 2 puff, Inhalation, BID, Myra Rude, MD, 2 puff at 07/28/15 1909 .  nitroGLYCERIN (NITROSTAT) SL tablet 0.4 mg, 0.4 mg, Sublingual, Q5 min PRN, Myra Rude, MD .  pantoprazole sodium (PROTONIX) 40 mg/20 mL oral suspension 40 mg, 40 mg, Per Tube, Daily, Myra Rude, MD, 40 mg at 07/28/15 0910 .  sodium chloride flush (NS) 0.9 % injection 3 mL, 3 mL, Intravenous, Q12H, Myra Rude, MD, 3 mL at 07/28/15 0912   Neurologic Examination:                                                                                                      Blood pressure 142/61, pulse 78, temperature 97.7 F (36.5 C), temperature source Oral, resp. rate 20, weight 52.5 kg (115 lb 11.9 oz), SpO2 98 %.  He is awake, alert is holding the phone, lying across the bed, mumbling speech with  hypophonia . He appears to have slightly worse postural tremors  in his limbs and appears jittery compared to yesterday. Suspect alcohol withdrawal. Moving all 4 extremities spontaneously although has significant rigidity in all limbs. Not oriented to place or time. Does not follow commands readily although with repetition able to follow simple one-step commands, suggestive of encephalopathy.      Lab Results: Basic Metabolic Panel:  Recent Labs Lab 07/27/15 1215 07/27/15 2039 07/28/15 0326  NA 150*  --  147*  K 4.3  --  4.8  CL 111  --  111  CO2 32  --  29  GLUCOSE 84  --  55*  BUN 36*  --  36*  CREATININE 2.35*  --  2.07*  CALCIUM 9.0  --  8.8*  MG  --  1.9  --   PHOS  --  3.4  --     Liver Function  Tests:  Recent Labs Lab 07/27/15 1215  AST 24  ALT 17  ALKPHOS 70  BILITOT 0.4  PROT 7.4  ALBUMIN 3.1*   No results for input(s): LIPASE, AMYLASE in the last 168 hours.  Recent Labs Lab 07/27/15 1345  AMMONIA 21    CBC:  Recent Labs Lab 07/27/15 1215 07/28/15 0326  WBC 5.8 4.4  HGB 7.6* 7.8*  HCT 25.8* 26.7*  MCV 82.4 83.7  PLT 283 286    Cardiac Enzymes: No results for input(s): CKTOTAL, CKMB, CKMBINDEX, TROPONINI in the last 168 hours.  Lipid Panel: No results for input(s): CHOL, TRIG, HDL, CHOLHDL, VLDL, LDLCALC in the last 168 hours.  CBG:  Recent Labs Lab 07/27/15 1750 07/28/15 0742 07/28/15 1149 07/28/15 1617 07/28/15 2021  GLUCAP 77 170* 107* 94 177*    Microbiology: Results for orders placed or performed during the hospital encounter of 05/08/15  MRSA PCR Screening     Status: None   Collection Time: 05/08/15  6:42 PM  Result Value Ref Range Status   MRSA by PCR NEGATIVE NEGATIVE Final    Comment:        The GeneXpert MRSA Assay (FDA approved for NASAL specimens only), is one component of a comprehensive MRSA colonization surveillance program. It is not intended to diagnose MRSA infection nor to guide or monitor treatment for MRSA infections.     Imaging: Dg Chest 2 View  07/27/2015  CLINICAL DATA:  Altered mental status, cough, congestion EXAM: CHEST  2 VIEW COMPARISON:  07/12/2015 FINDINGS: Cardiomediastinal silhouette is stable. No acute infiltrate or pleural effusion. No pulmonary edema. There is median sternotomy. Mild hyperinflation. IMPRESSION: No active disease. Status post median sternotomy. Cardiomegaly again noted. Mild hyperinflation. Electronically Signed   By: Natasha Mead M.D.   On: 07/27/2015 12:16   Ct Head Wo Contrast  07/27/2015  CLINICAL DATA:  Altered mental status, not responsive to staff, history stroke, hypertension, coronary artery disease, CHF, gout, supranuclear palsy EXAM: CT HEAD WITHOUT CONTRAST TECHNIQUE:  Contiguous axial images were obtained from the base of the skull through the vertex without intravenous contrast. COMPARISON:  05/16/2015 FINDINGS: Asymmetric positioning in gantry. Generalized atrophy. Normal ventricular morphology. No midline shift or mass effect. Small vessel chronic ischemic changes of deep cerebral white matter. 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 No definite intracranial hemorrhage, mass lesion or evidence acute infarction. No extra-axial fluid collections. Visualized paranasal sinuses and mastoid air cells clear. Skull intact. Atherosclerotic calcifications at the carotid siphons bilaterally. IMPRESSION: Atrophy with small vessel chronic ischemic changes of deep  cerebral white matter. 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 No acute intracranial abnormalities. Electronically Signed   By: Ulyses Southward M.D.   On: 07/27/2015 12:16    Assessment and plan:   Artemis Loyal is an 70 y.o. male patient who presented with altered mental status, with EEG showing evidence of encephalopathy. He also appears to be in delirium, likely multifactorial with possible alcohol withdrawal symptoms .He appears to be more jittery in his limbs today . Recommend the primary team to initiate CIWA protocol.  . Based on his poor baseline functioning, it would be beneficial to have a palliative team consultation to discuss with family about the future treatment goals . He is also known to have ongoing alcoholism , and poor compliance with medical instructions . Although he has severe dysphagia, status post PEG tube, he continues to take by mouth ad lib at the nursing facility,  which will significantly increase his risk of aspiration pneumonia. This needs to be addressed with his nursing facility to monitor him carefully not to allow any by mouth diet without recommendations from the speech pathologist.   There is not much else to offer from neurology  service at this time. We'll sign off . Please call if any further questions

## 2015-07-28 NOTE — Evaluation (Signed)
Clinical/Bedside Swallow Evaluation Patient Details  Name: Martin Cain MRN: [id number] Date of Birth: 04-Jan-1946  Today's Date: 07/28/2015 Time: SLP Start Time (ACUTE ONLY): 1030 SLP Stop Time (ACUTE ONLY): 1102 SLP Time Calculation (min) (ACUTE ONLY): 32 min  Past Medical History:  Past Medical History  Diagnosis Date  . Hypertension   . Hyperlipidemia   . CKD (chronic kidney disease)     a. Probable stage III.  Marland Kitchen CAD (coronary artery disease)     a. CABG 2000 in [place]. b. NSTEMI 04/2013 felt due to malignant hypertension (was instructed to f/u ATL for stress test).  . Gout   . CHF (congestive heart failure) (HCC)     a. Echo 04/2013: EF 45-50%, mild AS, mild biatrial enlargement, sev LVH.  . Osteoarthritis     R knee  . Anemia   . Progressive supranuclear palsies [facility])    Past Surgical History:  Past Surgical History  Procedure Laterality Date  . Cardiac surgery      2000; [place]  . Appendectomy    . Abdominal exploration surgery    . Tonsillectomy    . Flexible sigmoidoscopy Left 05/11/2015    Procedure: FLEXIBLE SIGMOIDOSCOPY;  Surgeon: Willis Modena, MD;  Location: [facility];  Service: Endoscopy;  Laterality: Left;   HPI:  Martin Cain is a 70 y.o. male presenting with increased shaking . PMH is significant for COPD, CAD, Chronic combined systolic and diastolic congestive heart failure, progressive supranuclear palsies, gout, HTN, HLD. 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    Assessment / Plan / Recommendation Clinical Impression  Pt with extensive hx of dysphagia (moderate oral and severe pharyngeal). Pt with 2 objective swallow studies in the past year (FEES 10-16 and MBS 11-16).  Previous MBS findings "moderate oral and severe pharyngeal dysphagia characterized by lingual pumping and delayed oral transit of bolus, reduced laryngeal/pharyngeal strength/ROM,  and  cricopharyngeal function resulting in severe post swallow residuals and decreased laryngeal closure with eventual penetration and aspiration of all tested pos (liquids > solids)". Attempted to obtain recent swallow hx from pt at nursing facility however pt with signficantly reduced intelligibilty of speech and likely an unreliable historian, though he states he was consuming a regular thin diet. PO trials this date resulted in continued s/sx of aspiration including wet vocal quality, coughing, and throat clearing. Recommend continue NPO with medicines via PEG tube. Consulted with MD, SLP to monitor for goals of care along with pt & family wishes to enhance quality of life.  .     Aspiration Risk  Severe aspiration risk    Diet Recommendation     Medication Administration: Via alternative means    Other  Recommendations Oral Care Recommendations: Oral care QID   Follow up Recommendations  24 hour supervision/assistance    Frequency and Duration min 2x/week  2 weeks       Prognosis Prognosis for Safe Diet Advancement: Guarded Barriers to Reach Goals: Severity of deficits      Swallow Study   General Date of Onset: 07/27/15 HPI: [name] is a 70 y.o. male presenting with increased shaking . PMH is significant for COPD, CAD, Chronic combined systolic and diastolic congestive heart failure, progressive supranuclear palsies, gout, HTN, HLD. 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  Type of Study: Bedside Swallow Evaluation Previous Swallow Assessment: FEES 10-16  and MBS 11-16 noted moderate oral severe pharyngeal dypshagia  Diet Prior to this Study: NPO Temperature Spikes Noted: No Respiratory Status: Room air History of Recent Intubation: No Behavior/Cognition: Alert Oral Cavity Assessment: Dry;Lesions;Dried secretions Oral Care Completed by SLP: Yes Oral Cavity - Dentition: Missing  dentition Self-Feeding Abilities: Needs assist Patient Positioning: Upright in bed Baseline Vocal Quality: Low vocal intensity;Hoarse Volitional Cough: Weak Volitional Swallow: Unable to elicit    Oral/Motor/Sensory Function Overall Oral Motor/Sensory Function: Moderate impairment Lingual ROM: Reduced right;Reduced left Lingual Strength: Reduced   Ice Chips Ice chips: Impaired   Thin Liquid Thin Liquid: Impaired Presentation: Cup Oral Phase Impairments: Reduced lingual movement/coordination Oral Phase Functional Implications: Prolonged oral transit Pharyngeal  Phase Impairments: Suspected delayed Swallow;Wet Vocal Quality;Multiple swallows    Nectar Thick Nectar Thick Liquid: Impaired Oral Phase Impairments: Reduced lingual movement/coordination Oral phase functional implications: Prolonged oral transit Pharyngeal Phase Impairments: Suspected delayed Swallow;Multiple swallows;Wet Vocal Quality   Honey Thick Honey Thick Liquid: Not tested   Puree Puree: Impaired Pharyngeal Phase Impairments: Wet Vocal Quality;Multiple swallows;Decreased hyoid-laryngeal movement;Suspected delayed Swallow   Solid   GO   Solid: Impaired Oral Phase Impairments: Impaired mastication;Reduced labial seal;Reduced lingual movement/coordination Oral Phase Functional Implications: Prolonged oral transit;Oral residue;Impaired mastication Pharyngeal Phase Impairments: Suspected delayed Swallow;Cough - Immediate;Multiple swallows;Decreased hyoid-laryngeal movement       Marcene Duos MA, CCC-SLP Acute Care Speech Language Pathologist    Marcene Duos E 07/28/2015,11:16 AM

## 2015-07-28 NOTE — Progress Notes (Signed)
Family Medicine Teaching Service Daily Progress Note Intern Pager: [phone]  Patient name: Martin Cain Medical record number: [id number] Date of birth: 03/22/1946 Age: 70 y.o. Gender: male  Primary Care Provider: Pincus Sanes, MD Consultants: Neuro Code Status: Full  Pt Overview and Major Events to Date:  1/17: Admitted to FPTS  Assessment and Plan: Martin Cain is a 70 y.o. male presenting with increased shaking . PMH is significant for COPD, CAD, Chronic combined systolic and diastolic congestive heart failure, progressive supranuclear palsies, gout, HTN, HLD.   Confusion and increased tremor (history of progressive supranuclear palsy and tremors): Concerned that his encephalopathy may be related to infectious versus metabolic causes. Neurology has been consulted. EEG shows slowing posterior dominant rhythm and Mild diffuse slowing of the waking background but is not specific for any etiology. His EtOH level <5, ammonia is normal. Metabolic panel is showing hypernatremia. No suggestion of infection with chest x-ray and urinalysis normal. CT of his head is only showing old infarcts. He appears to be alert and oriented not complaining of any headache to suggest a reason to perform a lumbar puncture. He has a normal lactic acid no leukocytosis and has been afebrile so withholding initiating antibiotics for now. Unsure if this is an extension of his supranuclear palsy.  - Telemetry - Blood cultures pending - If develops fevers would consider starting empiric antibiotics - Would consider lumbar puncture if develops fever - SLP consult - PT/OT consult - Nutrition consult - Appreciate neurology recommendations:   - DC Sinamet  - Started amantadine 100 mg daily  - Continue keppra   Hypernatremia: May be contributing to his principal problem. His sodium baseline appears to be around 140. Na 147 today.  - D5 50 cc/hr - home free water 100 cc 5 times daily   Dysphagia: there is a growing  concern for aspiration.  - NPO  - tube feeds  - SLP  - protonix   Normocytic anemia: admitted in November 2016 for a GI bleed. Hgb 7.6 on admission with baseline around 9. Hgb 7.8 today, MCV 83.7 - AM CBCs - ferrous sulfate   Protein Calorie Malnutrition, Severe: His weight seems to be around his baseline ~120 lbs. - tube feeds - nutrition   Chronic Combined systolic and diastolic congestive heart failure: Doesn't appear to be volume overloaded on exam and his weight is at or around his baseline. ECHO EF 35% with grade 1 DD.  - Gently add fluids - continue imdur and coreg   CKD III: Scr baseline around 2. Mild elevation today.  - giving fluids   CAD/HLD/HTN: appears stable  - continue lipitor, hydralazine,   COPD: Chest x-ray looks normal with no wheezing or sputum production - Continue home meds  Gout: No suggestion of acute flare - Continue Colchicine and allopurinol   Alcohol Abuse: His alcohol level is normal and no suggestion of withdrawal - Thiamine and folate - Will initiate CIWA if high suspicion of withdrawal symptoms  FEN/GI: D5 at 50 mL per hour, nothing by mouth Prophylaxis: SCD's  Disposition: Pending neurology and PT/OT recs  Subjective:  - No acute events overnight - Patient has some abdominal pain this morning where is PEG tube is but denies pain anywhere else - Although hard to understand his speech, he states his "shaking is not better" and that he used to be able to walk prior to coming to the hospital - Denies fevers, chills, shortness of breath, or chest pain  Objective: Temp:  [97.8  F (36.6 C)-98.5 F (36.9 C)] 98.3 F (36.8 C) (01/28 0115) Pulse Rate:  [69-88] 88 (01/28 0115) Resp:  [12-23] 18 (01/28 0115) BP: (124-171)/(57-84) 124/57 mmHg (01/28 0115) SpO2:  [91 %-100 %] 92 % (01/28 0115) Physical Exam: General: In NAD, underweight, frail, laying in bed watching TV, alert and oriented ENMT: tongue discoloration (see picture  below), mildly dry mucous membranes  Cardiovascular: RRR, normal s1 and s2 Respiratory: normal work of breathing, clear to auscultation bilaterally Abdomen: soft, sunken in due to low body fat, normal bowel sounds, no masses palpated, PEG tube in place, no erythema, drainage or swelling around site Extremities: no edema. Tips of left hand digits missing Neuro: Alert and oriented, spastic and dysarthric speech, left hand tremors and facial twitching, 5/5 strength in bilateral UE and LE.     Laboratory:  Recent Labs Lab 07/27/15 1215 07/28/15 0326  WBC 5.8 4.4  HGB 7.6* 7.8*  HCT 25.8* 26.7*  PLT 283 286    Recent Labs Lab 07/27/15 1215 07/28/15 0326  NA 150* 147*  K 4.3 4.8  CL 111 111  CO2 32 29  BUN 36* 36*  CREATININE 2.35* 2.07*  CALCIUM 9.0 8.8*  PROT 7.4  --   BILITOT 0.4  --   ALKPHOS 70  --   ALT 17  --   AST 24  --   GLUCOSE 84 55*    Imaging/Diagnostic Tests: Dg Chest 2 View  07/27/2015  CLINICAL DATA:  Altered mental status, cough, congestion EXAM: CHEST  2 VIEW COMPARISON:  07/12/2015 FINDINGS: Cardiomediastinal silhouette is stable. No acute infiltrate or pleural effusion. No pulmonary edema. There is median sternotomy. Mild hyperinflation. IMPRESSION: No active disease. Status post median sternotomy. Cardiomegaly again noted. Mild hyperinflation. Electronically Signed   By: Natasha Mead M.D.   On: 07/27/2015 12:16   Ct Head Wo Contrast  07/27/2015  CLINICAL DATA:  Altered mental status, not responsive to staff, history stroke, hypertension, coronary artery disease, CHF, gout, supranuclear palsy EXAM: CT HEAD WITHOUT CONTRAST TECHNIQUE: Contiguous axial images were obtained from the base of the skull through the vertex without intravenous contrast. COMPARISON:  05/16/2015 FINDINGS: Asymmetric positioning in gantry. Generalized atrophy. Normal ventricular morphology. No midline shift or mass effect. Small vessel chronic ischemic changes of deep cerebral white  matter. 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 No definite intracranial hemorrhage, mass lesion or evidence acute infarction. No extra-axial fluid collections. Visualized paranasal sinuses and mastoid air cells clear. Skull intact. Atherosclerotic calcifications at the carotid siphons bilaterally. IMPRESSION: Atrophy with small vessel chronic ischemic changes of deep cerebral white matter. 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 No acute intracranial abnormalities. Electronically Signed   By: Ulyses Southward M.D.   On: 07/27/2015 12:16    Beaulah Dinning, MD 07/28/2015, 7:29 AM PGY-1, [place] [facility] FPTS Intern pager: [phone], text pages welcome

## 2015-07-28 NOTE — Progress Notes (Signed)
PT Cancellation Note  Patient Details Name: [name] MRN: [id number] DOB: [birth year]   Cancelled Treatment:    Reason Eval/Treat Not Completed: Fatigue/lethargy limiting ability to participate. Pt unable to keep eyes open nor complete sentence prior to drifting back to sleep despite tactile cuing.  WIll re-attempt eval this pm as able.  Stephanie Acre [place], PT [phone]  Cecile Guevara 07/28/2015, 11:04 AM

## 2015-07-29 ENCOUNTER — Encounter (HOSPITAL_COMMUNITY): Payer: Self-pay | Admitting: Family Medicine

## 2015-07-29 DIAGNOSIS — E43 Unspecified severe protein-calorie malnutrition: Secondary | ICD-10-CM

## 2015-07-29 DIAGNOSIS — R410 Disorientation, unspecified: Secondary | ICD-10-CM | POA: Diagnosis not present

## 2015-07-29 DIAGNOSIS — R54 Age-related physical debility: Secondary | ICD-10-CM | POA: Diagnosis not present

## 2015-07-29 DIAGNOSIS — G9340 Encephalopathy, unspecified: Secondary | ICD-10-CM | POA: Diagnosis not present

## 2015-07-29 DIAGNOSIS — G231 Progressive supranuclear ophthalmoplegia [Steele-Richardson-Olszewski]: Secondary | ICD-10-CM | POA: Diagnosis not present

## 2015-07-29 DIAGNOSIS — R634 Abnormal weight loss: Secondary | ICD-10-CM

## 2015-07-29 HISTORY — DX: Age-related physical debility: R54

## 2015-07-29 HISTORY — DX: Disorientation, unspecified: R41.0

## 2015-07-29 LAB — GLUCOSE, CAPILLARY
GLUCOSE-CAPILLARY: 118 mg/dL — AB (ref 65–99)
GLUCOSE-CAPILLARY: 123 mg/dL — AB (ref 65–99)
GLUCOSE-CAPILLARY: 96 mg/dL (ref 65–99)
Glucose-Capillary: 122 mg/dL — ABNORMAL HIGH (ref 65–99)
Glucose-Capillary: 92 mg/dL (ref 65–99)
Glucose-Capillary: 95 mg/dL (ref 65–99)

## 2015-07-29 LAB — RENAL FUNCTION PANEL
Albumin: 2.7 g/dL — ABNORMAL LOW (ref 3.5–5.0)
Anion gap: 10 (ref 5–15)
BUN: 35 mg/dL — AB (ref 6–20)
CALCIUM: 8.8 mg/dL — AB (ref 8.9–10.3)
CHLORIDE: 104 mmol/L (ref 101–111)
CO2: 30 mmol/L (ref 22–32)
CREATININE: 1.91 mg/dL — AB (ref 0.61–1.24)
GFR, EST AFRICAN AMERICAN: 39 mL/min — AB (ref >60)
GFR, EST NON AFRICAN AMERICAN: 34 mL/min — AB (ref >60)
Glucose, Bld: 92 mg/dL (ref 65–99)
Phosphorus: 3.7 mg/dL (ref 2.5–4.6)
Potassium: 4.4 mmol/L (ref 3.5–5.1)
SODIUM: 144 mmol/L (ref 135–145)

## 2015-07-29 LAB — PREALBUMIN: PREALBUMIN: 19.2 mg/dL (ref 18–38)

## 2015-07-29 MED ORDER — LORAZEPAM 2 MG/ML IJ SOLN: 1.0000 mg | Freq: Four times a day (QID) | INTRAMUSCULAR | Status: DC | PRN

## 2015-07-29 MED ORDER — THIAMINE HCL 100 MG/ML IJ SOLN
100.0000 mg | Freq: Every day | INTRAMUSCULAR | Status: DC
  Filled 2015-07-29: qty 2

## 2015-07-29 MED ORDER — FOLIC ACID 1 MG PO TABS
1.0000 mg | ORAL_TABLET | Freq: Every day | ORAL | Status: DC
Start: 2015-07-29 — End: 2015-07-31
  Administered 2015-07-29 – 2015-07-31 (×3): 1 mg via ORAL
  Filled 2015-07-29 (×3): qty 1

## 2015-07-29 MED ORDER — WHITE PETROLATUM GEL
Status: AC
  Administered 2015-07-29: 13:00:00
  Filled 2015-07-29: qty 1

## 2015-07-29 MED ORDER — ADULT MULTIVITAMIN W/MINERALS CH
1.0000 | ORAL_TABLET | Freq: Every day | ORAL | Status: DC
  Administered 2015-07-29 – 2015-07-31 (×3): 1 via ORAL
  Filled 2015-07-29 (×3): qty 1

## 2015-07-29 MED ORDER — VITAMIN B-1 100 MG PO TABS
100.0000 mg | ORAL_TABLET | Freq: Every day | ORAL | Status: DC
  Administered 2015-07-29 – 2015-07-31 (×3): 100 mg via ORAL
  Filled 2015-07-29 (×3): qty 1

## 2015-07-29 MED ORDER — LORAZEPAM 1 MG PO TABS: 1.0000 mg | ORAL_TABLET | Freq: Four times a day (QID) | ORAL | Status: DC | PRN

## 2015-07-29 NOTE — Evaluation (Signed)
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 PMH of HTN, HLD, CKD stage 4, CAD, gout, CHF, dysphagia, and ongoing alcohol abuse.   Clinical Impression  Pt.  Does need further OT to maximize I and safety with ADLs and mobility. Pt. Does need A with ADLs and mobility currenlty. Unsure of level of A provided at home with dtr. Pt. Would benefit from futher OT to maximize I with caring for self to lessen caregiver burden. Pt. Was left in bed per nursing request with mitts reapplied and 3 side rails up.     Follow Up Recommendations  SNF    Equipment Recommendations  None recommended by OT    Recommendations for Other Services       Precautions / Restrictions Precautions Precautions: Fall;Other (comment) Precaution Comments: PEG, NPO, h/o ETOH abuse, decr vision Restrictions Weight Bearing Restrictions: No      Mobility Bed Mobility Overal bed mobility: Needs Assistance Bed Mobility: Rolling Rolling: Mod assist   Supine to sit: Mod assist        Transfers Overall transfer level: Needs assistance     Sit to Stand: Mod assist Stand pivot transfers: Mod assist            Balance                                            ADL Overall ADL's : Needs assistance/impaired Eating/Feeding: Minimal assistance   Grooming: Wash/dry hands;Wash/dry face;Minimal assistance   Upper Body Bathing: Minimal assitance   Lower Body Bathing: Maximal assistance   Upper Body Dressing : Moderate assistance   Lower Body Dressing: Maximal assistance   Toilet Transfer: Moderate assistance   Toileting- Clothing Manipulation and Hygiene: Maximal assistance       Functional mobility during ADLs: Moderate assistance General ADL  Comments:  (Pt. able to assist with ADLs. Pt. baseline unknown.)     Vision     Perception     Praxis      Pertinent Vitals/Pain Pain Assessment: No/denies pain     Hand Dominance     Extremity/Trunk Assessment Upper Extremity Assessment Upper Extremity Assessment: Overall WFL for tasks assessed           Communication Communication Communication: Expressive difficulties   Cognition Arousal/Alertness: Lethargic Behavior During Therapy: Flat affect Overall Cognitive Status: Difficult to assess       Memory: Decreased short-term memory             General Comments       Exercises       Shoulder Instructions      Home Living Family/patient expects to be discharged to:: Skilled nursing facility                                        Prior Functioning/Environment Level of Independence: Needs assistance (reports he was using a walker)        Comments: pt poor historian, unable to fully describe    OT Diagnosis: Generalized weakness   OT Problem List: Decreased activity tolerance;Decreased cognition;Decreased safety awareness;Decreased knowledge of use of DME  or AE   OT Treatment/Interventions: Self-care/ADL training;DME and/or AE instruction;Therapeutic activities    OT Goals(Current goals can be found in the care plan section) Acute Rehab OT Goals Patient Stated Goal:  (get out of here) OT Goal Formulation: With patient Time For Goal Achievement: 08/12/15 Potential to Achieve Goals: Good ADL Goals Pt Will Perform Grooming: with supervision;sitting Pt Will Perform Upper Body Bathing: with supervision;sitting Pt Will Perform Lower Body Bathing: with min guard assist;sit to/from stand Pt Will Perform Upper Body Dressing: with supervision;sitting Pt Will Perform Lower Body Dressing: with min guard assist;sit to/from stand Pt Will Transfer to Toilet: with supervision;bedside commode Pt Will Perform Toileting - Clothing Manipulation  and hygiene: with supervision;sit to/from stand Pt Will Perform Tub/Shower Transfer: with supervision;grab bars;rolling walker;3 in 1  OT Frequency: Min 2X/week   Barriers to D/C:            Co-evaluation              End of Session Equipment Utilized During Treatment: Gait belt Nurse Communication: Other (comment)  Activity Tolerance: Patient limited by lethargy Patient left: in bed;with call bell/phone within reach;with bed alarm set;Other (comment)   Time: 8295-6213 OT Time Calculation (min): 56 min Charges:  OT General Charges $OT Visit: 1 Procedure OT Evaluation $OT Eval Moderate Complexity: 1 Procedure OT Treatments $Self Care/Home Management : 23-37 mins G-Codes:    Tifanie Gardiner 07-31-2015, 9:58 AM

## 2015-07-29 NOTE — Discharge Summary (Signed)
Family Medicine Teaching [facility] Discharge Summary  Patient name: Martin Cain Medical record number: [id number] Date of birth: 21-Jul-1945 Age: 70 y.o. Gender: male Date of Admission: 07/27/2015  Date of Discharge: 07/30/2014 Admitting Physician: Leighton Roach McDiarmid, MD  Primary Care Provider: Pincus Sanes, MD Consultants: none  Indication for Hospitalization: worsening to tremor and change in speech  Discharge Diagnoses/Problem List:  Patient Active Problem List   Diagnosis Date Noted  . Weight loss, unintentional 07/29/2015  . Frailty 07/29/2015  . Acute delirium 07/29/2015  . Hypernatremia   . Protein-calorie malnutrition, moderate (HCC)   . Encephalopathy 07/27/2015  . Cataract 06/14/2015  . CAD (coronary artery disease) 06/14/2015  . COPD (chronic obstructive pulmonary disease) (HCC) 06/14/2015  . Tremors of nervous system 05/16/2015  . AKI (acute kidney injury) (HCC) 05/16/2015  . Hyperkalemia 05/16/2015  . Chronic combined systolic and diastolic congestive heart failure (HCC) 05/16/2015  . Normocytic anemia 05/16/2015  . Supranuclear palsies, progressive (HCC) 05/16/2015  . Essential hypertension 05/16/2015  . CKD (chronic kidney disease), stage III 05/16/2015  . Progressive supranuclear palsies (HCC)   . Pressure ulcer 05/09/2015  . Protein-calorie malnutrition, severe 05/09/2015  . Anemia 05/08/2015  . Absolute anemia   . Dysphagia   . Palliative care encounter   . Symptomatic anemia 04/17/2015  . Malnutrition of moderate degree (HCC) 06/21/2013  . HTN (hypertension) 06/20/2013  . Acute on chronic renal failure (HCC) 05/30/2013  . Acute on chronic combined systolic and diastolic CHF (congestive heart failure) (HCC) 05/28/2013  . Acute exacerbation of CHF (congestive heart failure) (HCC) 05/27/2013  . Elevated troponin 05/27/2013  . Gout flare 05/27/2013  . HTN (hypertension), malignant 05/27/2013     Disposition: SNF  Discharge Condition:  stable  Discharge Exam: Filed Vitals:   07/31/15 0701 07/31/15 0936 07/31/15 1016 07/31/15 1435  BP:   152/64 132/49  Pulse:   78 68  Temp:   98.3 F (36.8 C) 98 F (36.7 C)  TempSrc:   Oral Oral  Resp:   20 20  Weight: 118 lb 11.2 oz (53.842 kg)     SpO2:  98% 98% 100%   Physical Exam: General: In NAD, underweight, frail, cachectic ENMT: moist MMM, EOMI, pupils reactive Cardiovascular: RRR Respiratory: normal work of breathing, clear to auscultation bilaterally Abdomen: soft, thin, normal bowel sounds, no masses palpated, PEG tube in place, no erythema, drainage or swelling around site Extremities: no edema. mittens in place Neuro: Alert and oriented, spastic and dysarthric speech, no tremors or facial twitching, 5/5 strength in bilateral UE and LE.  This physical exam was performed by Dr. Jonathon Jordan the morning of discharge.  Brief Hospital Course:  Martin Cain is a 70 y.o. male who presented with increased shaking. PMH is significant for COPD, CAD, Chronic combined systolic and diastolic congestive heart failure, progressive supranuclear palsies, gout, HTN, HLD.   Confusion and increased tremor with history of progressive supranuclear palsy:  Pt transferred from home by EMS. On admission, vitals were stable and patient was afebrile. Tremors were noted in his hands, facial twitching, and abnormal speech. EKG unremarkable. Ammonia, lactic acid level, urinalysis, CXR, and CBC were normal. BMP showed hypernatremia of 150. CT of his head only showed old infarcts. Blood cultures were negative for three days. Neurology was consulted. EEG performed and results indicated diffuse cerebral dysfunction that was nonspecific in etiology. Sinemet was stopped, amantidine 100 mg was added, and home Keppra was conitnued.  PT, OT, and SLP were consulted. Patient failed his  swallow screen; he was placed on NPO diet and given medications and food via PEG tube that was already in place. PT recommended SNF  with 24 hour supervision.  On the day of discharge, patient was awake alert with mild resp difficulty and throat clearing. He is oriented to place and situation. Understands that if he eats he could "aspirate" and "die". He would still like to take food by mouth. Understands that if he stops Peg feeds he could die but would like to stop them. He is content with his decisions. He was also seen by palliative care and desired comfort care. However, he states that he would go by what his daughter wants. His daughter was not available at a time of palliative encounter. Palliative left a message for daughter for palliative follow up at SNF.  Hypernatremia: resolved. His sodium baseline appears to be around 140 but his admission sodium was 150. D5 50 mL per hour was given. Na 138 at the time of discharge.  Dysphagia and Severe Protein Calorie Malnutrion:  SLP consulted (as above) as well as nutrition. Bolus feeds of 240 ml of Jevity 1.5 via PEG were continued.  Normocytic anemia:  Hgb 7.6 on admission with baseline around 9. Hgb was monitored with daily CBC and pt given ferrous sulfate. By discharge, hgb was 7.8.   Alcohol Abuse: On admission his alcohol level is normal and no suggestion of withdrawal. CIWA protocol was not initiated. Thiamine and folate were given.  Other chronic conditions are stable.  Issues for Follow Up:  1. Palliative care consult and goal of care discussion 2. Protein-Calorie malniutrion: on Jevity via peg tube.  3. Anemia: recommoned repeating CBC in a week  Significant Procedures: none  Significant Labs and Imaging:   Recent Labs Lab 07/27/15 1215 07/28/15 0326  WBC 5.8 4.4  HGB 7.6* 7.8*  HCT 25.8* 26.7*  PLT 283 286    Recent Labs Lab 07/27/15 1215 07/27/15 2039 07/28/15 0326 07/29/15 0425 07/31/15 0600  NA 150*  --  147* 144 138  K 4.3  --  4.8 4.4 4.5  CL 111  --  111 104 99*  CO2 32  --  29 30 33*  GLUCOSE 84  --  55* 92 80  BUN 36*  --  36*  35* 38*  CREATININE 2.35*  --  2.07* 1.91* 2.10*  CALCIUM 9.0  --  8.8* 8.8* 8.6*  MG  --  1.9  --   --   --   PHOS  --  3.4  --  3.7  --   ALKPHOS 70  --   --   --   --   AST 24  --   --   --   --   ALT 17  --   --   --   --   ALBUMIN 3.1*  --   --  2.7*  --    Dg Chest 2 View  07/27/2015  CLINICAL DATA:  Altered mental status, cough, congestion EXAM: CHEST  2 VIEW COMPARISON:  07/12/2015 FINDINGS: Cardiomediastinal silhouette is stable. No acute infiltrate or pleural effusion. No pulmonary edema. There is median sternotomy. Mild hyperinflation. IMPRESSION: No active disease. Status post median sternotomy. Cardiomegaly again noted. Mild hyperinflation. Electronically Signed   By: Natasha Mead M.D.   On: 07/27/2015 12:16   Ct Head Wo Contrast  07/27/2015  CLINICAL DATA:  Altered mental status, not responsive to staff, history stroke, hypertension, coronary artery disease, CHF, gout, supranuclear  palsy EXAM: CT HEAD WITHOUT CONTRAST TECHNIQUE: Contiguous axial images were obtained from the base of the skull through the vertex without intravenous contrast. COMPARISON:  05/16/2015 FINDINGS: Asymmetric positioning in gantry. Generalized atrophy. Normal ventricular morphology. No midline shift or mass effect. Small vessel chronic ischemic changes of deep cerebral white matter. 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 No definite intracranial hemorrhage, mass lesion or evidence acute infarction. No extra-axial fluid collections. Visualized paranasal sinuses and mastoid air cells clear. Skull intact. Atherosclerotic calcifications at the carotid siphons bilaterally. IMPRESSION: Atrophy with small vessel chronic ischemic changes of deep cerebral white matter. 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 No acute intracranial abnormalities. Electronically Signed   By: Ulyses Southward M.D.   On: 07/27/2015 12:16   Results/Tests Pending at Time of Discharge:  none  Discharge Medications:    Medication List    STOP taking these medications        carbidopa-levodopa 25-100 MG tablet  Commonly known as:  SINEMET     multivitamin with minerals Tabs tablet      TAKE these medications        acetaminophen 325 MG tablet  Commonly known as:  TYLENOL  Take 2 tablets (650 mg total) by mouth every 6 (six) hours as needed for mild pain (or Fever >/= 101).     albuterol 108 (90 Base) MCG/ACT inhaler  Commonly known as:  PROVENTIL HFA;VENTOLIN HFA  Inhale 2 puffs into the lungs every 6 (six) hours as needed for wheezing or shortness of breath.     allopurinol 100 MG tablet  Commonly known as:  ZYLOPRIM  Take 1 tablet (100 mg total) by mouth daily.     amLODipine 10 MG tablet  Commonly known as:  NORVASC  Take 1 tablet (10 mg total) by mouth daily.     atorvastatin 80 MG tablet  Commonly known as:  LIPITOR  Take 1 tablet (80 mg total) by mouth daily.     carvedilol 25 MG tablet  Commonly known as:  COREG  Take 1 tablet (25 mg total) by mouth 2 (two) times daily.     colchicine 0.6 MG tablet  Take 1 tablet (0.6 mg total) by mouth daily.     [employer] ASPIRIN LOW DOSE 81 MG EC tablet  Generic drug:  aspirin  Take 81 mg by mouth daily.     feeding supplement (JEVITY 1.5 CAL/FIBER) Liqd  Place 240 mLs into feeding tube 6 (six) times daily.     ferrous sulfate 325 (65 FE) MG tablet  Take 1 tablet (325 mg total) by mouth every morning.     Fluticasone-Salmeterol 250-50 MCG/DOSE Aepb  Commonly known as:  ADVAIR  Inhale 1 puff into the lungs 2 (two) times daily.     folic acid 1 MG tablet  Commonly known as:  FOLVITE  Place 1 mg into feeding tube daily.     free water Soln  Place 100 mLs into feeding tube 5 (five) times daily.     hydrALAZINE 100 MG tablet  Commonly known as:  APRESOLINE  Take 1 tablet (100 mg total) by mouth every morning.     hydrocortisone 2.5 % rectal cream  Commonly known as:  ANUSOL-HC  Place rectally 4  (four) times daily.     ipratropium-albuterol 0.5-2.5 (3) MG/3ML Soln  Commonly known as:  DUONEB  Inhale 3 mLs into the lungs every 6 (six) hours as needed (shortness of breath).  isosorbide dinitrate 40 MG tablet  Commonly known as:  ISORDIL  Take 1 tablet (40 mg total) by mouth daily.     levETIRAcetam 500 MG tablet  Commonly known as:  KEPPRA  Take 1 tablet (500 mg total) by mouth 2 (two) times daily.     LORazepam 1 MG tablet  Commonly known as:  ATIVAN  Take 1 tablet (1 mg total) by mouth every 6 (six) hours as needed for anxiety.     nitroGLYCERIN 0.4 MG SL tablet  Commonly known as:  NITROSTAT  Place 0.4 mg under the tongue every 5 (five) minutes as needed for chest pain.     PROTONIX 40 mg/20 mL Pack  Generic drug:  pantoprazole sodium  Place 40 mg into feeding tube daily. In 30 ml of applesauce.     thiamine 100 MG tablet  Take 1 tablet (100 mg total) by mouth daily.     VITAMIN B-12 PO  Place 1,000 mg into feeding tube daily.     VITAMIN D PO  Place 1,000 mg into feeding tube daily.        Discharge Instructions: Please refer to Patient Instructions section of EMR for full details.  Patient was counseled important signs and symptoms that should prompt return to medical care, changes in medications, dietary instructions, activity restrictions, and follow up appointments.   Follow-Up Appointments: Follow-up Information    Follow up with [facility] .   Specialty:  Skilled Nursing Facility   Contact information:   [address] [place] [address] [phone]     Almon Hercules, MD 07/31/2015, 4:35 PM PGY-1, [facility]

## 2015-07-29 NOTE — Progress Notes (Signed)
Called MD regarding tan mucous plugs patient was spitting up. He was constantly gurgling and attempting to spit out productive mucous. Stopped 240 tube feed for tonight and MD stated to restart in AM and monitor closely. Melina Schools, [place] 07/29/2015 9:28 PM

## 2015-07-29 NOTE — Progress Notes (Signed)
Family Medicine Teaching Service Daily Progress Note Intern Pager: [phone]  Patient name: Martin Cain Medical record number: [id number] Date of birth: 1946/01/17 Age: 70 y.o. Gender: male  Primary Care Provider: Pincus Sanes, MD Consultants: Neuro Code Status: Full  Pt Overview and Major Events to Date:  1/17: Admitted to FPTS  Assessment and Plan: Martin Cain is a 70 y.o. male presenting with increased shaking . PMH is significant for COPD, CAD, Chronic combined systolic and diastolic congestive heart failure, progressive supranuclear palsies, gout, HTN, HLD.   Confusion and increased tremor (history of progressive supranuclear palsy and tremors): Neurology has been consulted. EEG shows encephalopathy. Ammonia normal. Hypernatremia. No evidence of infection on CXR/UA. 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 Possible extension of his supranuclear palsy.  - Blood cultures pending - Would consider lumbar puncture if develops fever - SLP consult >> recommending NPO w/ tube feeds - PT/OT consult >> recommends SNF - Nutrition consult >> of Jevity 1.5 via PEG - Appreciate neurology recommendations: DC Sinamet, amantadine 100 mg daily, continue keppra  - Palliative has been consulted; recs pending.  Hypernatremia: (improving/resolved) May be contributing to his principal problem. His sodium baseline appears to be around 140. Na 144 today.  - D5 50 cc/hr - home free water 100 cc 5 times daily  - will continue to monitor; consideration to make D5 KVO if Na drops too far.  Dysphagia: there is a growing concern for aspiration.  - NPO  - tube feeds  - protonix   Normocytic anemia: admitted in November 2016 for a GI bleed. Hgb 7.6 on admission with baseline around 9. Hgb 7.8 on 1/28, MCV 83.7 - AM CBC - ferrous sulfate   Protein Calorie Malnutrition, Severe: His weight seems to be around his baseline ~120 lbs. - tube feeds - nutrition   Chronic Combined systolic and diastolic congestive heart  failure: Doesn't appear to be volume overloaded on exam and his weight is at or around his baseline. ECHO EF 35% with grade 1 DD.  - Gently add fluids - continue imdur and coreg   CKD III: Scr baseline around 2. Mild elevation today.  - giving fluids   CAD/HLD/HTN:   - continue lipitor, hydralazine - BP appears to be relatively unstable. Unknown if this is 2/2 movement or discomfort >> will monitor.  COPD: Chest x-ray looks normal with no wheezing or sputum production - Continue home meds  Gout: No suggestion of acute flare - Continue Colchicine and allopurinol   Alcohol Abuse: His alcohol level is normal and no suggestion of withdrawal - Thiamine and folate - Initiating CIWA at this time (1/30)  FEN/GI: D5 at 50 mL per hour, nothing by mouth Prophylaxis: SCD's  Disposition: pending clinically improvement and Palliative recs.  Subjective:  - No acute events overnight. He reports feeling well, but difficult to assess fully due to speech issues. He denies chills, shortness of breath, or chest pain  Objective: Temp:  [97.5 F (36.4 C)-98 F (36.7 C)] 97.7 F (36.5 C) (01/29 1110) Pulse Rate:  [66-78] 75 (01/29 1110) Resp:  [18-20] 18 (01/29 1110) BP: (119-174)/(51-73) 128/73 mmHg (01/29 1110) SpO2:  [98 %-100 %] 100 % (01/29 1110) Weight:  [108 lb 3.9 oz (49.1 kg)] 108 lb 3.9 oz (49.1 kg) (01/29 0500) Physical Exam: General: In NAD, underweight, frail, cachectic ENMT: dry MMM, EOMI, pupils reactive Cardiovascular: RRR Respiratory: normal work of breathing, clear to auscultation bilaterally Abdomen: soft, thin, normal bowel sounds, no masses palpated, PEG tube in place,  no erythema, drainage or swelling around site Extremities: no edema. mittens in place Neuro: Alert and oriented, spastic and dysarthric speech, left hand tremors and facial twitching, 5/5 strength in bilateral UE and LE.   Laboratory:  Recent Labs Lab 07/27/15 1215 07/28/15 0326  WBC 5.8 4.4  HGB  7.6* 7.8*  HCT 25.8* 26.7*  PLT 283 286    Recent Labs Lab 07/27/15 1215 07/28/15 0326 07/29/15 0425  NA 150* 147* 144  K 4.3 4.8 4.4  CL 111 111 104  CO2 32 29 30  BUN 36* 36* 35*  CREATININE 2.35* 2.07* 1.91*  CALCIUM 9.0 8.8* 8.8*  PROT 7.4  --   --   BILITOT 0.4  --   --   ALKPHOS 70  --   --   ALT 17  --   --   AST 24  --   --   GLUCOSE 84 55* 92    Imaging/Diagnostic Tests: No results found.  Kathee Delton, MD 07/29/2015, 11:33 AM PGY-2, [facility] FPTS Intern pager: [phone], text pages welcome

## 2015-07-29 NOTE — Consult Note (Signed)
WOC consulted for incontinence.  Spoke with bedside nurse who reports patient can tell you when he needs to urinate but issue is related to his Progressive Supranuclear Palsy which commonly associated with dementia that limits his cognitive status at time.  Discussed use of condom catheter however this patient "picks" at his IVs, telemetry leads therefore and additional line may not be the best choice.  Due to the fact his incontinence is not a functional incontinence but more related to his cognitive issues the best choice for him is really just Q 2 hour toileting.  Recommended use of zinc based barrier cream to the affected areas of MASD (moisture associated skin damage) to protect from any urine or stool.  Would not recommend the use of any type of incontinence briefs while inpatient, maybe only if family wanted to use during the day if patient is out of the home.  Discussed POC with patient and bedside nurse.  Re consult if needed, will not follow at this time. Thanks  Shayona H[employer], CWOCN [phone])

## 2015-07-30 DIAGNOSIS — G231 Progressive supranuclear ophthalmoplegia [Steele-Richardson-Olszewski]: Secondary | ICD-10-CM | POA: Diagnosis not present

## 2015-07-30 LAB — GLUCOSE, CAPILLARY
GLUCOSE-CAPILLARY: 124 mg/dL — AB (ref 65–99)
GLUCOSE-CAPILLARY: 79 mg/dL (ref 65–99)
Glucose-Capillary: 95 mg/dL (ref 65–99)
Glucose-Capillary: 129 mg/dL — ABNORMAL HIGH (ref 65–99)
Glucose-Capillary: 97 mg/dL (ref 65–99)
Glucose-Capillary: 71 mg/dL (ref 65–99)

## 2015-07-30 NOTE — Clinical Social Work Note (Signed)
Per MD (resident), Palliative Care consult pending and patient will not be released today, 1/30. Cordie Grice and facility notified.   Patient will discharge to SNF, [facility] once medically stable.   CSW remains available as needed.   Derenda Fennel, MSW, LCSWA [phone] 07/30/2015 3:44 PM

## 2015-07-30 NOTE — Progress Notes (Signed)
Family Medicine Teaching Service Daily Progress Note Intern Pager: [phone]  Patient name: Martin Cain Medical record number: [id number] Date of birth: 11-Mar-1946 Age: 70 y.o. Gender: male  Primary Care Provider: Pincus Sanes, MD Consultants: Neuro Code Status: Full  Pt Overview and Major Events to Date:  1/17: Admitted to FPTS  Assessment and Plan: Martin Cain is a 70 y.o. male presenting with increased shaking . PMH is significant for COPD, CAD, Chronic combined systolic and diastolic congestive heart failure, progressive supranuclear palsies, gout, HTN, HLD.   Confusion and increased tremor (history of progressive supranuclear palsy and tremors): Neurology has been consulted. EEG shows encephalopathy. Ammonia normal. Hypernatremia. No evidence of infection on CXR/UA. 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 Possible extension of his supranuclear palsy.  - Blood cultures NG x 1 day - Would consider lumbar puncture if develops fever - SLP consult >> recommending NPO w/ tube feeds - PT/OT consult >> recommends SNF - Nutrition consult >> of Jevity 1.5 via PEG - Appreciate neurology recommendations: DC Sinamet, amantadine 100 mg daily, continue keppra  - Palliative has been consulted; recs pending. - MMSE today  Hypernatremia: (resolved)  Na 144 on 1/29.  - DC D5 50 cc/hr - home free water 100 cc 5 times daily  - will continue to monitor  Dysphagia: there is a growing concern for aspiration. Had episode yesterday of coughing up tan "mucous plugs" Feeding was held last night and resumed this morning - NPO  - tube feeds  - protonix   Normocytic anemia: admitted in November 2016 for a GI bleed. Hgb 7.6 on admission with baseline around 9. Hgb 7.8 on 1/28, MCV 83.7 - AM CBC today - ferrous sulfate   Protein Calorie Malnutrition, Severe: His weight seems to be around his baseline ~120 lbs. - tube feeds - nutrition   Chronic Combined systolic and diastolic congestive heart failure: Doesn't  appear to be volume overloaded on exam and his weight is at or around his baseline. ECHO EF 35% with grade 1 DD.  - SLIV - continue imdur and coreg   CKD III: Scr baseline around 2. Cr 1.91 on 1/30 - Holding fluids for now  CAD/HLD/HTN:   - continue hydralazine, will stop Lipitor   COPD: Chest x-ray looks normal with no wheezing or sputum production - Continue home meds  Gout: No suggestion of acute flare - Continue Colchicine and allopurinol   Alcohol Abuse: His alcohol level is normal and no suggestion of withdrawal - Thiamine and folate - DC CIWA, has had scores of mostly 0 over last 24 hours.  FEN/GI: SLIV, NPO, PEG tube feeds Prophylaxis: SCD's  Disposition: pending Palliative rec and SNF placement  Subjective:  - Was coughing up some mucous last night, has resolved this morning - PEG feedings were held last night and resumed this morning - Speech improved today compared to Saturday - Would like to know when he can eat by mouth, discussed reasoning behind NPO with patient - Denies chills, shortness of breath, or chest pain  Objective: Temp:  [97.6 F (36.4 C)-98.2 F (36.8 C)] 98.2 F (36.8 C) (01/30 0510) Pulse Rate:  [65-82] 82 (01/30 0510) Resp:  [18-20] 18 (01/30 0510) BP: (118-128)/(49-73) 125/63 mmHg (01/30 0510) SpO2:  [97 %-100 %] 99 % (01/30 0510) Weight:  [117 lb 15.1 oz (53.5 kg)] 117 lb 15.1 oz (53.5 kg) (01/30 0500) Physical Exam: General: In NAD, underweight, frail, cachectic ENMT: moist MMM, EOMI, pupils reactive Cardiovascular: RRR Respiratory: normal work of breathing,  clear to auscultation bilaterally Abdomen: soft, thin, normal bowel sounds, no masses palpated, PEG tube in place, no erythema, drainage or swelling around site Extremities: no edema. mittens in place Neuro: Alert and oriented, spastic and dysarthric speech, no tremors or facial twitching, 5/5 strength in bilateral UE and LE.   Laboratory:  Recent Labs Lab 07/27/15 1215  07/28/15 0326  WBC 5.8 4.4  HGB 7.6* 7.8*  HCT 25.8* 26.7*  PLT 283 286    Recent Labs Lab 07/27/15 1215 07/28/15 0326 07/29/15 0425  NA 150* 147* 144  K 4.3 4.8 4.4  CL 111 111 104  CO2 32 29 30  BUN 36* 36* 35*  CREATININE 2.35* 2.07* 1.91*  CALCIUM 9.0 8.8* 8.8*  PROT 7.4  --   --   BILITOT 0.4  --   --   ALKPHOS 70  --   --   ALT 17  --   --   AST 24  --   --   GLUCOSE 84 55* 92    Imaging/Diagnostic Tests: No results found.  Beaulah Dinning, MD 07/30/2015, 8:08 AM PGY-1, [facility] Family Medicine FPTS Intern pager: [phone], text pages welcome

## 2015-07-30 NOTE — Clinical Social Work Note (Signed)
Patient has bed at [facility], [facility] and [facility]. Patient's dtr, [name] to complete paperwork with facility between 3:30-4:30PM. MD notified.   CSW remains available as needed.   Derenda Fennel, MSW, LCSWA [phone] 07/30/2015 2:33 PM

## 2015-07-30 NOTE — NC FL2 (Signed)
[place] MEDICAID FL2 LEVEL OF CARE SCREENING TOOL     IDENTIFICATION  Patient Name: Martin Cain Birthdate: June 22, 1946 Sex: male Admission Date (Current Location): 07/27/2015  [facility] and [place] Number:  Producer, television/film/video and Address:  [facility], [address] [place], [place] [address]      Provider Number: [id number]  Attending Physician Name and Address:  Leighton Roach McDiarmid, MD  Relative Name and Phone Number:       Current Level of Care: Hospital Recommended Level of Care: Skilled Nursing Facility Prior Approval Number:    Date Approved/Denied:   PASRR Number: [id number]  Discharge Plan: SNF    Current Diagnoses: Patient Active Problem List   Diagnosis Date Noted  . Weight loss, unintentional 07/29/2015  . Frailty 07/29/2015  . Acute delirium 07/29/2015  . Hypernatremia   . Protein-calorie malnutrition, moderate (HCC)   . Encephalopathy 07/27/2015  . Cataract 06/14/2015  . CAD (coronary artery disease) 06/14/2015  . COPD (chronic obstructive pulmonary disease) (HCC) 06/14/2015  . Tremors of nervous system 05/16/2015  . AKI (acute kidney injury) (HCC) 05/16/2015  . Hyperkalemia 05/16/2015  . Chronic combined systolic and diastolic congestive heart failure (HCC) 05/16/2015  . Normocytic anemia 05/16/2015  . Supranuclear palsies, progressive (HCC) 05/16/2015  . Essential hypertension 05/16/2015  . CKD (chronic kidney disease), stage III 05/16/2015  . Progressive supranuclear palsies (HCC)   . Pressure ulcer 05/09/2015  . Protein-calorie malnutrition, severe 05/09/2015  . Anemia 05/08/2015  . Absolute anemia   . Dysphagia   . Palliative care encounter   . Symptomatic anemia 04/17/2015  . Malnutrition of moderate degree (HCC) 06/21/2013  . HTN (hypertension) 06/20/2013  . Acute on chronic renal failure (HCC) 05/30/2013  . Acute on chronic combined systolic and diastolic CHF (congestive heart failure) (HCC) 05/28/2013  .  Acute exacerbation of CHF (congestive heart failure) (HCC) 05/27/2013  . Elevated troponin 05/27/2013  . Gout flare 05/27/2013  . HTN (hypertension), malignant 05/27/2013    Orientation RESPIRATION BLADDER Height & Weight    Self, Time, Situation, Place  Normal Incontinent  (175.3 cm) 117 lbs.  BEHAVIORAL SYMPTOMS/MOOD NEUROLOGICAL BOWEL NUTRITION STATUS   (NONE)  (NONE) Continent Diet, Feeding tube (PEG tube)  AMBULATORY STATUS COMMUNICATION OF NEEDS Skin   Extensive Assist Verbally Normal                       Personal Care Assistance Level of Assistance  Bathing, Dressing, Feeding Bathing Assistance: Maximum assistance Feeding assistance: Maximum assistance Dressing Assistance: Maximum assistance     Functional Limitations Info   (NONE)          SPECIAL CARE FACTORS FREQUENCY  PT (By licensed PT), OT (By licensed OT)     PT Frequency: 2 OT Frequency: 2            Contractures      Additional Factors Info  Code Status, Allergies Code Status Info: FULL CODE  Allergies Info: N/A           Current Medications (07/30/2015):  This is the current hospital active medication list Current Facility-Administered Medications  Medication Dose Route Frequency Provider Last Rate Last Dose  . acetaminophen (TYLENOL) tablet 650 mg  650 mg Oral Q6H PRN Myra Rude, MD      . albuterol (PROVENTIL) (2.5 MG/3ML) 0.083% nebulizer solution 3 mL  3 mL Inhalation Q6H PRN Myra Rude, MD      .  allopurinol (ZYLOPRIM) tablet 100 mg  100 mg Oral Daily Myra Rude, MD   100 mg at 07/29/15 1043  . amLODipine (NORVASC) tablet 10 mg  10 mg Oral Daily Myra Rude, MD   10 mg at 07/29/15 1043  . atorvastatin (LIPITOR) tablet 80 mg  80 mg Oral q1800 Myra Rude, MD   80 mg at 07/29/15 1718  . carvedilol (COREG) tablet 25 mg  25 mg Oral BID Myra Rude, MD   25 mg at 07/29/15 2141  . colchicine tablet 0.3 mg  0.3 mg Oral Daily Leighton Roach McDiarmid, MD    0.3 mg at 07/29/15 1044  . dextrose 5 % solution   Intravenous Continuous Myra Rude, MD 50 mL/hr at 07/29/15 2034    . feeding supplement (JEVITY 1.5 CAL/FIBER) liquid 240 mL  240 mL Per Tube 6 X Daily Myra Rude, MD   240 mL at 07/30/15 0522  . ferrous sulfate 220 (44 Fe) MG/5ML solution 220 mg  220 mg Oral Q breakfast Beaulah Dinning, MD   220 mg at 07/29/15 1042  . folic acid (FOLVITE) tablet 1 mg  1 mg Oral Daily Kathee Delton, MD   1 mg at 07/29/15 1043  . free water 100 mL  100 mL Per Tube 5 X Daily Myra Rude, MD   100 mL at 07/30/15 0600  . hydrALAZINE (APRESOLINE) tablet 100 mg  100 mg Oral Daily Myra Rude, MD   100 mg at 07/29/15 1044  . hydrocortisone (ANUSOL-HC) 2.5 % rectal cream   Rectal QID Myra Rude, MD      . insulin aspart (novoLOG) injection 0-9 Units  0-9 Units Subcutaneous TID WC Leighton Roach McDiarmid, MD   1 Units at 07/29/15 1755  . ipratropium-albuterol (DUONEB) 0.5-2.5 (3) MG/3ML nebulizer solution 3 mL  3 mL Inhalation Q6H PRN Myra Rude, MD      . isosorbide dinitrate (ISORDIL) tablet 40 mg  40 mg Oral Daily Myra Rude, MD   40 mg at 07/29/15 1045  . levETIRAcetam (KEPPRA) tablet 500 mg  500 mg Oral BID Myra Rude, MD   500 mg at 07/29/15 2141  . LORazepam (ATIVAN) tablet 1 mg  1 mg Oral Q6H PRN Kathee Delton, MD       Or  . LORazepam (ATIVAN) injection 1 mg  1 mg Intravenous Q6H PRN Kathee Delton, MD      . LORazepam (ATIVAN) tablet 1 mg  1 mg Oral Q6H PRN Myra Rude, MD      . mometasone-formoterol [facility]) 100-5 MCG/ACT inhaler 2 puff  2 puff Inhalation BID Myra Rude, MD   2 puff at 07/29/15 1930  . multivitamin with minerals tablet 1 tablet  1 tablet Oral Daily Kathee Delton, MD   1 tablet at 07/29/15 1045  . nitroGLYCERIN (NITROSTAT) SL tablet 0.4 mg  0.4 mg Sublingual Q5 min PRN Myra Rude, MD      . pantoprazole sodium (PROTONIX) 40 mg/20 mL oral suspension 40 mg  40 mg Per Tube Daily Myra Rude, MD   40 mg at 07/29/15 1042  . sodium chloride flush (NS) 0.9 % injection 3 mL  3 mL Intravenous Q12H Myra Rude, MD   3 mL at 07/29/15 1046  . thiamine (VITAMIN B-1) tablet 100 mg  100 mg Oral Daily Kathee Delton, MD   100 mg at 07/29/15 1045  Or  . thiamine (B-1) injection 100 mg  100 mg Intravenous Daily Kathee Delton, MD         Discharge Medications: Please see discharge summary for a list of discharge medications.  Relevant Imaging Results:  Relevant Lab Results:   Additional Information SSN [id number]  Vaughan Browner, LCSW

## 2015-07-30 NOTE — Clinical Social Work Placement (Signed)
   CLINICAL SOCIAL WORK PLACEMENT  NOTE  Date:  07/30/2015  Patient Details  Name: Harrold Fitchett MRN: [id number] Date of Birth: 1946/06/29  Clinical Social Work is seeking post-discharge placement for this patient at the Skilled  Nursing Facility level of care (*CSW will initial, date and re-position this form in  chart as items are completed):  Yes   Patient/family provided with [place] Clinical Social Work Department's list of facilities offering this level of care within the geographic area requested by the patient (or if unable, by the patient's family).  Yes   Patient/family informed of their freedom to choose among providers that offer the needed level of care, that participate in Medicare, Medicaid or managed care program needed by the patient, have an available bed and are willing to accept the patient.  Yes   Patient/family informed of [facility] [place]'s ownership interest in [facility] and [facility], as well as of the fact that they are under no obligation to receive care at these facilities.  PASRR submitted to EDS on       PASRR number received on       Existing PASRR number confirmed on 07/30/15     FL2 transmitted to all facilities in geographic area requested by pt/family on 07/30/15     FL2 transmitted to all facilities within larger geographic area on       Patient informed that his/her managed care company has contracts with or will negotiate with certain facilities, including the following:        Yes   Patient/family informed of bed offers received.  Patient chooses bed at  [facility] )     Physician recommends and patient chooses bed at      Patient to be transferred to  [facility] ) on  .  Patient to be transferred to facility by       Patient family notified on   of transfer.  Name of family member notified:        PHYSICIAN Please prepare priority discharge summary, including medications, Please sign  FL2     Additional Comment:    _______________________________________________ Vaughan Browner, LCSW 07/30/2015, 1:53 PM

## 2015-07-31 DIAGNOSIS — Z515 Encounter for palliative care: Secondary | ICD-10-CM

## 2015-07-31 DIAGNOSIS — R1310 Dysphagia, unspecified: Secondary | ICD-10-CM

## 2015-07-31 DIAGNOSIS — G231 Progressive supranuclear ophthalmoplegia [Steele-Richardson-Olszewski]: Secondary | ICD-10-CM | POA: Diagnosis not present

## 2015-07-31 LAB — BASIC METABOLIC PANEL
Anion gap: 6 (ref 5–15)
BUN: 38 mg/dL — AB (ref 6–20)
CHLORIDE: 99 mmol/L — AB (ref 101–111)
CO2: 33 mmol/L — AB (ref 22–32)
CREATININE: 2.1 mg/dL — AB (ref 0.61–1.24)
Calcium: 8.6 mg/dL — ABNORMAL LOW (ref 8.9–10.3)
GFR calc Af Amer: 35 mL/min — ABNORMAL LOW (ref >60)
GFR calc non Af Amer: 30 mL/min — ABNORMAL LOW (ref >60)
Glucose, Bld: 80 mg/dL (ref 65–99)
Potassium: 4.5 mmol/L (ref 3.5–5.1)
Sodium: 138 mmol/L (ref 135–145)

## 2015-07-31 LAB — GLUCOSE, CAPILLARY
Glucose-Capillary: 128 mg/dL — ABNORMAL HIGH (ref 65–99)
Glucose-Capillary: 134 mg/dL — ABNORMAL HIGH (ref 65–99)
Glucose-Capillary: 155 mg/dL — ABNORMAL HIGH (ref 65–99)

## 2015-07-31 MED ORDER — LEVETIRACETAM 100 MG/ML PO SOLN
500.0000 mg | Freq: Two times a day (BID) | ORAL | Status: DC
  Administered 2015-07-31 (×2): 500 mg via ORAL
  Filled 2015-07-31 (×3): qty 5

## 2015-07-31 NOTE — Care Management Note (Signed)
Case Management Note  Patient Details  Name: Darius Lundberg MRN: [id number] Date of Birth: 11-25-1945  Subjective/Objective:                    Action/Plan: Patient was admitted with encephalopathy.  Plan is to discharge to [facility] SNF today. Palliative to meet with patient's Daughter, Jim Like this morning.  CM confirmed this with Ms Renee Rival on 07/30/15 via phone.  Expected Discharge Date:                  Expected Discharge Plan:  Skilled Nursing Facility  In-House Referral:  Clinical Social Work, Hospice / Palliative Care  Discharge planning Services     Post Acute Care Choice:    Choice offered to:     DME Arranged:    DME Agency:     HH Arranged:    HH Agency:     Status of Service:  Completed, signed off  Medicare Important Message Given:    Date Medicare IM Given:    Medicare IM give by:    Date Additional Medicare IM Given:    Additional Medicare Important Message give by:     If discussed at Long Length of Stay Meetings, dates discussed:    Additional CommentsAnda Kraft, RN 07/31/2015, 10:21 AM [phone]

## 2015-07-31 NOTE — Discharge Instructions (Signed)
It has been a pleasure taking care of you! You were admitted due to worsening tremor. This could be medication side effects. So, we discontinued some of your medications. We have also made some adjustments to your other medications. Please, make sure to read the directions before you take them. The names and directions on how to take these medications are found on this discharge paper under medication section.  Take care,

## 2015-07-31 NOTE — Progress Notes (Signed)
Family Medicine Teaching Service Daily Progress Note Intern Pager: [phone]  Patient name: Martin Cain Medical record number: [id number] Date of birth: [birth year] Age: 70 y.o. Gender: male  Primary Care Provider: Pincus Sanes, MD Consultants: Neuro Code Status: Full  Pt Overview and Major Events to Date:  1/17: Admitted to FPTS  Assessment and Plan: Martin Cain is a 70 y.o. male presenting with increased shaking . PMH is significant for COPD, CAD, Chronic combined systolic and diastolic congestive heart failure, progressive supranuclear palsies, gout, HTN, HLD.   Confusion and increased tremor (history of progressive supranuclear palsy and tremors): Neurology has been consulted. EEG shows encephalopathy. Ammonia normal. Hypernatremia. No evidence of infection on CXR/UA. 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 Possible extension of his supranuclear palsy.  - Blood cultures NG x 3 days - SLP consult >> recommending NPO w/ tube feeds - PT/OT consult >> recommends SNF - Nutrition consult >> of Jevity 1.5 via PEG - Appreciate neurology recommendations: DC Sinamet, amantadine 100 mg daily, continue keppra  - Palliative has been consulted; recs pending. - MMSE today reveals score of 24   Hypernatremia: (resolved)   - home free water 100 cc 5 times daily  - will continue to monitor  Dysphagia: there is a growing concern for aspiration. Had episodes of coughing up tan "mucous plugs"  - NPO  - tube feeds  - protonix  - suction when develops coughing spells  Normocytic anemia: admitted in November 2016 for a GI bleed. Hgb 7.6 on admission with baseline around 9. Hgb 7.8 on 1/28, MCV 83.7 - ferrous sulfate   Protein Calorie Malnutrition, Severe: His weight seems to be around his baseline ~120 lbs. - tube feeds - nutrition   Chronic Combined systolic and diastolic congestive heart failure: Doesn't appear to be volume overloaded on exam and his weight is at or around his baseline. ECHO EF 35% with  grade 1 DD.  - continue imdur and coreg   CKD III: Scr baseline around 2. Cr 1.91 on 1/30 - Holding fluids for now  CAD/HLD/HTN:   - continue hydralazine, will stop Lipitor   COPD: Chest x-ray looks normal with no wheezing or sputum production - Continue home meds  Gout: No suggestion of acute flare - Continue Colchicine and allopurinol   Alcohol Abuse: His alcohol level is normal and no suggestion of withdrawal. S/p CIWA, had scores of zero.  - Thiamine and folate  FEN/GI: SLIV, NPO, PEG tube feeds Prophylaxis: SCD's  Disposition: pending Palliative rec and SNF placement  Subjective:  - Was coughing up some mucous last night still  - States he slept well otherwise - Speech better today than on admission - Discussed with patient that he would need tube feeds if he doesn't want to aspirate, he expressed understanding - Denies chills, shortness of breath, or chest pain  Objective: Temp:  [97.5 F (36.4 C)-98.2 F (36.8 C)] 97.6 F (36.4 C) (01/31 0620) Pulse Rate:  [63-80] 69 (01/31 0620) Resp:  [16-20] 20 (01/31 0620) BP: (119-141)/(50-68) 134/54 mmHg (01/31 0620) SpO2:  [97 %-100 %] 98 % (01/31 0620) Weight:  [118 lb 11.2 oz (53.842 kg)] 118 lb 11.2 oz (53.842 kg) (01/31 0701) Physical Exam: General: In NAD, underweight, frail, cachectic ENMT: moist MMM, EOMI, pupils reactive Cardiovascular: RRR Respiratory: normal work of breathing, clear to auscultation bilaterally Abdomen: soft, thin, normal bowel sounds, no masses palpated, PEG tube in place, no erythema, drainage or swelling around site Extremities: no edema. mittens in place Neuro: Alert  and oriented, spastic and dysarthric speech, no tremors or facial twitching, 5/5 strength in bilateral UE and LE.   Laboratory:  Recent Labs Lab 07/27/15 1215 07/28/15 0326  WBC 5.8 4.4  HGB 7.6* 7.8*  HCT 25.8* 26.7*  PLT 283 286    Recent Labs Lab 07/27/15 1215 07/28/15 0326 07/29/15 0425 07/31/15 0600  NA  150* 147* 144 138  K 4.3 4.8 4.4 4.5  CL 111 111 104 99*  CO2 32 29 30 33*  BUN 36* 36* 35* 38*  CREATININE 2.35* 2.07* 1.91* 2.10*  CALCIUM 9.0 8.8* 8.8* 8.6*  PROT 7.4  --   --   --   BILITOT 0.4  --   --   --   ALKPHOS 70  --   --   --   ALT 17  --   --   --   AST 24  --   --   --   GLUCOSE 84 55* 92 80    Imaging/Diagnostic Tests: No results found.  Beaulah Dinning, MD 07/31/2015, 7:08 AM PGY-1, [place] Family Medicine FPTS Intern pager: [phone], text pages welcome

## 2015-07-31 NOTE — Progress Notes (Signed)
[facility]  Patient Status: Active (receiving services up to time of hospitalization)  [facility] is providing the following services: RN, PT, OT and ST  Was referred for services but admitted to Hospital prior to start of care.  If patient discharges after hours, please call [phone].   Martin Cain 07/31/2015, 11:23 AM

## 2015-07-31 NOTE — Progress Notes (Signed)
CM received a call from [place] with [facility] stating that patient was active with them for [facility] services since 07/03/15, prior to admission.  Documentation from [facility] was noted in the chart. CM spoke with Darl Pikes with [facility], who stated it was a new referral from the PCP office, which was not yet opened due to patient's hospitalization.  [facility] to close patient's case. [facility] is aware that patient will be discharging to SNF.  Elmer Bales RN, MSN [phone]

## 2015-07-31 NOTE — Progress Notes (Signed)
Pt discharging to SNF via stretcher to [facility] taking all personal belongings. Pt alert, verbal with no noted distress. VS stable. IV discontinued, dry dressing applied. Discharge paperwork given to transporters. Report called to nurse Shanda Bumps.

## 2015-07-31 NOTE — Clinical Social Work Note (Signed)
Clinical Social Work Assessment  Patient Details  Name: Martin Cain MRN: [id number] Date of Birth: June 17, 1946  Date of referral:  07/30/2015            Reason for consult:  Facility Placement, Discharge Planning                Permission sought to share information with:  Family Supports, Customer service manager, Case Optician, dispensing granted to share information::  Yes, Verbal Permission Granted  Name::      Doroteo Bradford)  Agency::   [facility] )  Relationship::     Contact Information:     Housing/Transportation Living arrangements for the past 2 months:  [place] of Information:  Patient, Adult Children Patient Interpreter Needed:  None Criminal Activity/Legal Involvement Pertinent to Current Situation/Hospitalization:  No - Comment as needed Significant Relationships:  Adult Children Lives with:  Self Do you feel safe going back to the place where you live?  No Need for family participation in patient care:  No (Coment)  Care giving concerns:  Patient requiring short-term rehab placement at SNF.    Social Worker assessment / plan:  CSW met with pt in regards to discharge planning. CSW introduced CSW role and SNF process. Pt gave CSW permission to contact dtr, Doroteo Bradford. Pt and dtr, Erika agreeable to SNF placement at [facility]. Pt was recently discharged from [facility] with Medicaid pending. [facility] agree[facility] to pt returning due to [facility] pending. Palliative Care to meet with patient and dtr upon d/c. No further concerns reported by pt or dtr at this time. CSW remains available as needed.   Employment status:  Retired Nurse, adult, Self Pay (Medicaid Pending) PT Recommendations:  [place] / Referral to community resources:  [place]  Patient/Family's Response to care:  Pt a/o x4. Pt and dtr agreeable to SNF. Prefers [place]. Dtr supportive and involved in care.  Dtr and pt appreciated social work intervention.   Patient/Family's Understanding of and Emotional Response to Diagnosis, Current Treatment, and Prognosis:  Pt's understanding of medical intervention and palliative care meeting.   Emotional Assessment Appearance:  Appears older than stated age Attitude/Demeanor/Rapport:   (Pleasant ) Affect (typically observed):  Accepting Orientation:  Oriented to Self, Oriented to Place, Oriented to  Time, Oriented to Situation Alcohol / Substance use:  Not Applicable Psych involvement (Current and /or in the community):  No (Comment)  Discharge Needs  Concerns to be addressed:  Care Coordination Readmission within the last 30 days:  No Current discharge risk:  Dependent with Mobility Barriers to Discharge:  Barriers Resolved   Glendon Axe, MSW, LCSWA [phone]

## 2015-07-31 NOTE — Clinical Social Work Placement (Signed)
   CLINICAL SOCIAL WORK PLACEMENT  NOTE  Date:  07/31/2015  Patient Details  Name: Martin Cain MRN: [id number] Date of Birth: [birth year]  Clinical Social Work is seeking post-discharge placement for this patient at the Skilled  Nursing Facility level of care (*CSW will initial, date and re-position this form in  chart as items are completed):  Yes   Patient/family provided with [place] Clinical Social Work Department's list of facilities offering this level of care within the geographic area requested by the patient (or if unable, by the patient's family).  Yes   Patient/family informed of their freedom to choose among providers that offer the needed level of care, that participate in Medicare, Medicaid or managed care program needed by the patient, have an available bed and are willing to accept the patient.  Yes   Patient/family informed of [facility]'s ownership interest in [facility] and [facility], as well as of the fact that they are under no obligation to receive care at these facilities.  PASRR submitted to EDS on       PASRR number received on       Existing PASRR number confirmed on 07/30/15     FL2 transmitted to all facilities in geographic area requested by pt/family on 07/30/15     FL2 transmitted to all facilities within larger geographic area on       Patient informed that his/her managed care company has contracts with or will negotiate with certain facilities, including the following:        Yes   Patient/family informed of bed offers received.  Patient chooses bed at  [facility] AND [facility] )     Physician recommends and patient chooses bed at      Patient to be transferred to  [facility] ) on 07/31/15.  Patient to be transferred to facility by  [facility] )     Patient family notified on 07/31/15 of transfer.  Name of family member notified:   ([name], [name] )     PHYSICIAN Please prepare priority discharge  summary, including medications, Please sign FL2     Additional Comment:    _______________________________________________ Vaughan Browner, LCSW 07/31/2015, 10:46 AM

## 2015-07-31 NOTE — Consult Note (Signed)
Consultation Note Date: 07/31/2015   Patient Name: Martin Cain  DOB: [birth year]  MRN: [id number]  Age / Sex: 70 y.o., male  PCP: Pincus Sanes, MD Referring Physician: Leighton Roach McDiarmid, MD  Reason for Consultation: Establishing goals of care    Clinical Assessment/Narrative: I spoke with [name] today who appeared alert and oriented and understanding of his condition. He tells me that he has had "69 good years" and clearly wishes to eat/drink as a comfort measure. We discussed consequences of focusing more on comfort and that this can cause his death and soon - he understands this. We also discussed code status in which he expresses desire for DNR. HOWEVER, when we discuss these decisions it does come up that his daughter, [name], wants more aggressive care and not on board with comfort measures as she wishes for him to live as long as possible. He tells me that he will do whatever [name] wants for him to do as she is his "baby girl." He expresses interest in meeting with [name] together to come to an understanding of these goals and a joint decision. Unfortunately [name] did not answer her phone (and did not return my call). I did leave voicemail expressing her father's desire to have these conversations with her and encouraged her to meet with palliative at SNF to have these conversations as he is discharging today. Difficult circumstances. Hopeful family can support his wishes. Recommend palliative follow up at SNF.   Contacts/Participants in Discussion: Primary Decision Maker: [name]   Relationship to Patient daughter   SUMMARY OF RECOMMENDATIONS - Strongly recommend palliative conversations at SNF  Code Status/Advance Care Planning: Full code    Code Status Orders        Start     Ordered   07/27/15 1812  Full code   Continuous     07/27/15 1818    Code Status History    Date Active Date Inactive Code  Status Order ID Comments User Context   05/16/2015  9:33 PM 05/19/2015  8:22 PM Full Code [id number]  Ron , MD Inpatient   05/08/2015  7:06 PM 05/13/2015  6:49 PM Full Code [id number]  Zannie Cove, MD Inpatient   04/24/2015 12:33 PM 04/25/2015  6:11 PM DNR [id number]  Edsel Petrin, DO Inpatient   04/23/2015 11:26 AM 04/24/2015 12:33 PM Full Code [id number]  Gilmer Mor, DO Inpatient   04/19/2015  7:52 AM 04/23/2015 11:26 AM DNR [id number]  Edsel Petrin, DO Inpatient   04/17/2015 11:19 PM 04/19/2015  7:52 AM Full Code [id number]  Hillary Bow, DO ED   06/20/2013  3:43 AM 06/22/2013  8:43 PM Full Code [id number]  Eduard Clos, MD Inpatient   05/27/2013  4:12 PM 05/31/2013 10:54 PM Partial Code [id number]  Penny Pia, MD Inpatient        Symptom Management:   Aspiration: Patient desires comfort feedings but will need to have approval from daughter.   Palliative Prophylaxis:   Aspiration, Bowel Regimen and Turn Reposition   Psycho-social/Spiritual:  Support System: Fair Desire for further Chaplaincy support:no Additional Recommendations: Caregiving  Support/Resources and Education on Hospice  Prognosis: < 6 months with progressive neurological disease. Much less if he desires more comfort focused care and comfort feeds vs continued tube feeding.   Discharge Planning: Skilled Nursing Facility for rehab with Palliative care service follow-up   Chief Complaint/ Primary Diagnoses: Present on Admission:  . Encephalopathy . Tremors of nervous system . Hypernatremia . Protein-calorie malnutrition, moderate (HCC) .  Weight loss, unintentional . Frailty . Malnutrition of moderate degree (HCC) . Progressive supranuclear palsies (HCC) . Protein-calorie malnutrition, severe . Acute delirium  I have reviewed the medical record, interviewed the patient and family, and examined the patient. The following aspects are pertinent.  Past Medical History  Diagnosis  Date  . Hypertension   . Hyperlipidemia   . CKD (chronic kidney disease)     a. Probable stage III.  Marland Kitchen CAD (coronary artery disease)     a. CABG 2000 in [place]. b. NSTEMI 04/2013 felt due to malignant hypertension (was instructed to f/u ATL for stress test).  . Gout   . CHF (congestive heart failure) (HCC)     a. Echo 04/2013: EF 45-50%, mild AS, mild biatrial enlargement, sev LVH.  . Osteoarthritis     R knee  . Anemia   . Progressive supranuclear palsies (HCC)   . Frailty 07/29/2015  . Acute delirium 07/29/2015   Social History   Social History  . Marital Status: Divorced    Spouse Name: N/A  . Number of Children: 2  . Years of Education: N/A   Social History Main Topics  . Smoking status: Former Smoker -- 0.50 packs/day for 40 years    Types: Cigarettes  . Smokeless tobacco: Never Used  . Alcohol Use: 0.0 oz/week    0 Standard drinks or equivalent per week     Comment: previously admitted to 1 pint per day. denies use for 1 month.  . Drug Use: No  . Sexual Activity: Not Asked   Other Topics Concern  . None   Social History Narrative   Currently lives at [facility].     Retired from being an Lexicographer.   Family History  Problem Relation Age of Onset  . Heart disease      No family history  . Cancer Father   . Hypertension Father   . Cancer Brother   . Cancer Sister   . Healthy Daughter    Scheduled Meds: . allopurinol  100 mg Oral Daily  . amLODipine  10 mg Oral Daily  . carvedilol  25 mg Oral BID  . colchicine  0.3 mg Oral Daily  . feeding supplement (JEVITY 1.5 CAL/FIBER)  240 mL Per Tube 6 X Daily  . ferrous sulfate  220 mg Oral Q breakfast  . folic acid  1 mg Oral Daily  . free water  100 mL Per Tube 5 X Daily  . hydrALAZINE  100 mg Oral Daily  . hydrocortisone   Rectal QID  . isosorbide dinitrate  40 mg Oral Daily  . levETIRAcetam  500 mg Oral BID  . mometasone-formoterol  2 puff Inhalation BID  . multivitamin with  minerals  1 tablet Oral Daily  . pantoprazole sodium  40 mg Per Tube Daily  . sodium chloride flush  3 mL Intravenous Q12H  . thiamine  100 mg Oral Daily   Or  . thiamine  100 mg Intravenous Daily   Continuous Infusions:  PRN Meds:.acetaminophen, albuterol, ipratropium-albuterol, LORazepam, nitroGLYCERIN Medications Prior to Admission:  Prior to Admission medications   Medication Sig Start Date End Date Taking? Authorizing Provider  albuterol (PROVENTIL HFA;VENTOLIN HFA) 108 (90 Base) MCG/ACT inhaler Inhale 2 puffs into the lungs every 6 (six) hours as needed for wheezing or shortness of breath. 07/18/15  Yes Pincus Sanes, MD  allopurinol (ZYLOPRIM) 100 MG tablet Take 1 tablet (100 mg total) by mouth daily. 07/18/15  Yes Bobette Mo  Burns, MD  colchicine 0.6 MG tablet Take 1 tablet (0.6 mg total) by mouth daily. 07/18/15  Yes Pincus Sanes, MD  hydrocortisone (ANUSOL-HC) 2.5 % rectal cream Place rectally 4 (four) times daily. 05/13/15  Yes Kathlen Mody, MD  acetaminophen (TYLENOL) 325 MG tablet Take 2 tablets (650 mg total) by mouth every 6 (six) hours as needed for mild pain (or Fever >/= 101). 05/19/15   Belkys A Regalado, MD  amLODipine (NORVASC) 10 MG tablet Take 1 tablet (10 mg total) by mouth daily. 07/18/15   Pincus Sanes, MD  atorvastatin (LIPITOR) 80 MG tablet Take 1 tablet (80 mg total) by mouth daily. 07/18/15   Pincus Sanes, MD  carbidopa-levodopa (SINEMET) 25-100 MG tablet Take 0.5 tablets by mouth 3 (three) times daily. 05/07/15   Donika K Patel, DO  carvedilol (COREG) 25 MG tablet Take 1 tablet (25 mg total) by mouth 2 (two) times daily. 07/18/15   Pincus Sanes, MD  Cholecalciferol (VITAMIN D PO) Place 1,000 mg into feeding tube daily.     Historical Provider, MD  [facility] ASPIRIN LOW DOSE 81 MG EC tablet Take 81 mg by mouth daily. 04/11/15   Historical Provider, MD  Cyanocobalamin (VITAMIN B-12 PO) Place 1,000 mg into feeding tube daily.     Historical Provider, MD  ferrous sulfate 325  (65 FE) MG tablet Take 1 tablet (325 mg total) by mouth every morning. 07/18/15   Pincus Sanes, MD  Fluticasone-Salmeterol (ADVAIR) 250-50 MCG/DOSE AEPB Inhale 1 puff into the lungs 2 (two) times daily. 07/18/15   Pincus Sanes, MD  folic acid (FOLVITE) 1 MG tablet Place 1 mg into feeding tube daily.  04/11/15   Historical Provider, MD  hydrALAZINE (APRESOLINE) 100 MG tablet Take 1 tablet (100 mg total) by mouth every morning. 07/18/15   Pincus Sanes, MD  ipratropium-albuterol (DUONEB) 0.5-2.5 (3) MG/3ML SOLN Inhale 3 mLs into the lungs every 6 (six) hours as needed (shortness of breath). 07/18/15   Pincus Sanes, MD  isosorbide dinitrate (ISORDIL) 40 MG tablet Take 1 tablet (40 mg total) by mouth daily. 07/18/15   Pincus Sanes, MD  levETIRAcetam (KEPPRA) 500 MG tablet Take 1 tablet (500 mg total) by mouth 2 (two) times daily. 07/18/15   Pincus Sanes, MD  LORazepam (ATIVAN) 1 MG tablet Take 1 tablet (1 mg total) by mouth every 6 (six) hours as needed for anxiety. 07/18/15   Pincus Sanes, MD  Multiple Vitamin (MULTIVITAMIN WITH MINERALS) TABS tablet Take 1 tablet by mouth daily. Patient taking differently: Place 1 tablet into feeding tube daily.  04/24/15   Maryann Mikhail, DO  nitroGLYCERIN (NITROSTAT) 0.4 MG SL tablet Place 0.4 mg under the tongue every 5 (five) minutes as needed for chest pain.    Historical Provider, MD  Nutritional Supplements (FEEDING SUPPLEMENT, JEVITY 1.5 CAL/FIBER,) LIQD Place 240 mLs into feeding tube 6 (six) times daily. 05/19/15   Belkys A Regalado, MD  pantoprazole sodium (PROTONIX) 40 mg/20 mL PACK Place 40 mg into feeding tube daily. In 30 ml of applesauce.    Historical Provider, MD  thiamine 100 MG tablet Take 1 tablet (100 mg total) by mouth daily. Patient taking differently: Place 100 mg into feeding tube daily.  04/24/15   Maryann Mikhail, DO  Water For Irrigation, Sterile (FREE WATER) SOLN Place 100 mLs into feeding tube 5 (five) times daily. 05/13/15   Kathlen Mody,  MD   No Known Allergies  Review of  Systems  Constitutional: Positive for activity change, appetite change and fatigue.  Respiratory: Positive for cough.   Neurological: Positive for tremors and weakness.    Physical Exam  Constitutional: He is oriented to person, place, and time. He appears well-developed.  HENT:  Head: Normocephalic and atraumatic.  Cardiovascular: Normal rate.   Respiratory: Effort normal.  GI: Normal appearance.  + PEG  Neurological: He is alert and oriented to person, place, and time.    Vital Signs: BP 132/49 mmHg  Pulse 68  Temp(Src) 98 F (36.7 C) (Oral)  Resp 20  Wt 53.842 kg (118 lb 11.2 oz)  SpO2 100%  SpO2: SpO2: 100 % O2 Device:SpO2: 100 % O2 Flow Rate: .   IO: Intake/output summary:  Intake/Output Summary (Last 24 hours) at 07/31/15 1537 Last data filed at 07/30/15 2100  Gross per 24 hour  Intake    343 ml  Output      0 ml  Net    343 ml    LBM: Last BM Date: 07/30/15 Baseline Weight: Weight: 52.5 kg (115 lb 11.9 oz) Most recent weight: Weight: 53.842 kg (118 lb 11.2 oz)      Palliative Assessment/Data:  Flowsheet Rows        Most Recent Value   Intake Tab    Referral Department  -- [Family Medicine]   Unit at Time of Referral  Med/Surg Unit   Palliative Care Primary Diagnosis  Neurology   Date Notified  07/29/15   Palliative Care Type  Return patient Palliative Care   Reason for referral  Clarify Goals of Care   Date of Admission  07/27/15   # of days IP prior to Palliative referral  2   Clinical Assessment    Psychosocial & Spiritual Assessment    Palliative Care Outcomes       Additional Data Reviewed:  CBC:    Component Value Date/Time   WBC 4.4 07/28/2015 0326   HGB 7.8* 07/28/2015 0326   HCT 26.7* 07/28/2015 0326   PLT 286 07/28/2015 0326   MCV 83.7 07/28/2015 0326   NEUTROABS 3.5 06/18/2015 1221   LYMPHSABS 1.5 06/18/2015 1221   MONOABS 0.4 06/18/2015 1221   EOSABS 0.3 06/18/2015 1221   BASOSABS 0.1  06/18/2015 1221   Comprehensive Metabolic Panel:    Component Value Date/Time   NA 138 07/31/2015 0600   K 4.5 07/31/2015 0600   CL 99* 07/31/2015 0600   CO2 33* 07/31/2015 0600   BUN 38* 07/31/2015 0600   CREATININE 2.10* 07/31/2015 0600   CREATININE 1.96* 06/18/2015 1221   GLUCOSE 80 07/31/2015 0600   CALCIUM 8.6* 07/31/2015 0600   AST 24 07/27/2015 1215   ALT 17 07/27/2015 1215   ALKPHOS 70 07/27/2015 1215   BILITOT 0.4 07/27/2015 1215   PROT 7.4 07/27/2015 1215   ALBUMIN 2.7* 07/29/2015 0425     Time In: 1500 Time Out: 1540 Time Total: Greater than 50%  of this time was spent counseling and coordinating care related to the above assessment and plan.  Signed by: Ulice Bold, NP  Ulice Bold, NP  07/31/2015, 3:37 PM  Please contact Palliative Medicine Team phone at [phone] for questions and concerns.

## 2015-07-31 NOTE — Progress Notes (Signed)
Physical Therapy Treatment Patient Details Name: Martin Cain MRN: [id number] DOB: 1945/08/24 Today's Date: 07/31/2015    History of Present Illness 70 yo male readmitted with tremors of the nervous system related to progressive supranuclear palsy, hx of severe malnutrition. PMH of HTN, HLD, CKD stage 4, CAD, gout, CHF, dysphagia, and ongoing alcohol abuse.    PT Comments    Progressing towards physical therapy goals. Able to ambulate up to 45 feet today with min assist for walker control. Tolerated transfer training and therapeutic exercises well. Patient will continue to benefit from skilled physical therapy services to further improve independence with functional mobility.   Follow Up Recommendations  SNF;Supervision/Assistance - 24 hour     Equipment Recommendations  None recommended by PT    Recommendations for Other Services       Precautions / Restrictions Precautions Precautions: Fall Precaution Comments: PEG, NPO, h/o ETOH abuse, decr vision Restrictions Weight Bearing Restrictions: No    Mobility  Bed Mobility Overal bed mobility: Needs Assistance Bed Mobility: Sidelying to Sit;Rolling Rolling: Min guard Sidelying to sit: Mod assist;HOB elevated       General bed mobility comments: Mod assist with HOB elevated for truncal support to rise to EOB. VC for technique. use of bed pad to scoot forward.  Transfers Overall transfer level: Needs assistance Equipment used: Rolling walker (2 wheeled) Transfers: Sit to/from Stand Sit to Stand: Min assist         General transfer comment: Min assist for boost to stand. Slight posterior lean. VC for hand placement. Steady with RW for support upon standing upright.  Ambulation/Gait Ambulation/Gait assistance: Min assist Ambulation Distance (Feet): 45 Feet Assistive device: Rolling walker (2 wheeled) Gait Pattern/deviations: Step-to pattern;Step-through pattern;Decreased stride length;Shuffle;Drifts right/left;Trunk  flexed;Narrow base of support Gait velocity: slow Gait velocity interpretation: <1.8 ft/sec, indicative of risk for recurrent falls General Gait Details: Frequent min assist for walker control. Very rigid with apraxic bouts during turns. Tactile and verbal cues for walker placement for proximity. Cues for larger steps due to shuffled gait.   Stairs            Wheelchair Mobility    Modified Rankin (Stroke Patients Only)       Balance Overall balance assessment: Needs assistance Sitting-balance support: No upper extremity supported;Feet supported Sitting balance-Leahy Scale: Fair     Standing balance support: Bilateral upper extremity supported Standing balance-Leahy Scale: Poor                      Cognition Arousal/Alertness: Awake/alert Behavior During Therapy: Flat affect Overall Cognitive Status: Within Functional Limits for tasks assessed                      Exercises General Exercises - Lower Extremity Ankle Circles/Pumps: AROM;Both;10 reps;Seated Long Arc Quad: Strengthening;Both;10 reps;Seated Hip Flexion/Marching: Strengthening;Both;10 reps;Seated    General Comments General comments (skin integrity, edema, etc.): Practiced visual scanning with gait      Pertinent Vitals/Pain Pain Assessment: No/denies pain    Home Living                      Prior Function            PT Goals (current goals can now be found in the care plan section) Progress towards PT goals: Progressing toward goals    Frequency  Min 2X/week    PT Plan Current plan remains appropriate    Co-evaluation  End of Session Equipment Utilized During Treatment: Gait belt Activity Tolerance: Patient tolerated treatment well Patient left: in chair;with call bell/phone within reach;with chair alarm set     Time: 1610-9604 PT Time Calculation (min) (ACUTE ONLY): 23 min  Charges:  $Gait Training: 8-22 mins $Therapeutic Activity: 8-22  mins                    G Codes:      Berton Mount August 24, 2015, 12:09 PM Charlsie Merles, [place] [phone]

## 2015-07-31 NOTE — Clinical Social Work Note (Signed)
Clinical Social Worker facilitated patient discharge including contacting patient family and facility to confirm patient discharge plans.  Clinical information faxed to facility and family agreeable with plan.  CSW arranged ambulance transport via [facility] to [facility].  RN to call report prior to discharge.  Clinical Social Worker will sign off for now as social work intervention is no longer needed. Please consult [place] again if new need arises.  Derenda Fennel, MSW, LCSWA [phone] 07/31/2015 2:54 PM

## 2015-08-01 ENCOUNTER — Telehealth: Payer: Self-pay | Admitting: *Deleted

## 2015-08-01 DIAGNOSIS — R251 Tremor, unspecified: Secondary | ICD-10-CM | POA: Diagnosis not present

## 2015-08-01 DIAGNOSIS — R531 Weakness: Secondary | ICD-10-CM | POA: Diagnosis not present

## 2015-08-01 DIAGNOSIS — N184 Chronic kidney disease, stage 4 (severe): Secondary | ICD-10-CM | POA: Diagnosis not present

## 2015-08-01 DIAGNOSIS — E039 Hypothyroidism, unspecified: Secondary | ICD-10-CM | POA: Diagnosis not present

## 2015-08-01 DIAGNOSIS — N183 Chronic kidney disease, stage 3 (moderate): Secondary | ICD-10-CM | POA: Diagnosis not present

## 2015-08-01 DIAGNOSIS — M109 Gout, unspecified: Secondary | ICD-10-CM | POA: Diagnosis not present

## 2015-08-01 DIAGNOSIS — G231 Progressive supranuclear ophthalmoplegia [Steele-Richardson-Olszewski]: Secondary | ICD-10-CM | POA: Diagnosis not present

## 2015-08-01 DIAGNOSIS — J449 Chronic obstructive pulmonary disease, unspecified: Secondary | ICD-10-CM | POA: Diagnosis not present

## 2015-08-01 DIAGNOSIS — R2689 Other abnormalities of gait and mobility: Secondary | ICD-10-CM | POA: Diagnosis not present

## 2015-08-01 DIAGNOSIS — I10 Essential (primary) hypertension: Secondary | ICD-10-CM | POA: Diagnosis not present

## 2015-08-01 DIAGNOSIS — E559 Vitamin D deficiency, unspecified: Secondary | ICD-10-CM | POA: Diagnosis not present

## 2015-08-01 DIAGNOSIS — D631 Anemia in chronic kidney disease: Secondary | ICD-10-CM | POA: Diagnosis not present

## 2015-08-01 DIAGNOSIS — D649 Anemia, unspecified: Secondary | ICD-10-CM | POA: Diagnosis not present

## 2015-08-01 DIAGNOSIS — E785 Hyperlipidemia, unspecified: Secondary | ICD-10-CM | POA: Diagnosis not present

## 2015-08-01 DIAGNOSIS — R410 Disorientation, unspecified: Secondary | ICD-10-CM | POA: Diagnosis not present

## 2015-08-01 DIAGNOSIS — M6281 Muscle weakness (generalized): Secondary | ICD-10-CM | POA: Diagnosis not present

## 2015-08-01 DIAGNOSIS — R1312 Dysphagia, oropharyngeal phase: Secondary | ICD-10-CM | POA: Diagnosis not present

## 2015-08-01 DIAGNOSIS — I5040 Unspecified combined systolic (congestive) and diastolic (congestive) heart failure: Secondary | ICD-10-CM | POA: Diagnosis not present

## 2015-08-01 DIAGNOSIS — H259 Unspecified age-related cataract: Secondary | ICD-10-CM | POA: Diagnosis not present

## 2015-08-01 NOTE — Telephone Encounter (Signed)
Pt was on TCM list admitted for increase tremors & change in speech. Pt d/c 1/31, and sent to SNF @ [facility].../lmb

## 2015-08-02 LAB — CULTURE, BLOOD (ROUTINE X 2)
CULTURE: NO GROWTH
Culture: NO GROWTH

## 2015-08-03 DIAGNOSIS — R251 Tremor, unspecified: Secondary | ICD-10-CM | POA: Diagnosis not present

## 2015-08-03 DIAGNOSIS — N184 Chronic kidney disease, stage 4 (severe): Secondary | ICD-10-CM | POA: Diagnosis not present

## 2015-08-03 DIAGNOSIS — G231 Progressive supranuclear ophthalmoplegia [Steele-Richardson-Olszewski]: Secondary | ICD-10-CM | POA: Diagnosis not present

## 2015-08-03 DIAGNOSIS — R410 Disorientation, unspecified: Secondary | ICD-10-CM | POA: Diagnosis not present

## 2015-08-06 DIAGNOSIS — R531 Weakness: Secondary | ICD-10-CM | POA: Diagnosis not present

## 2015-08-16 ENCOUNTER — Ambulatory Visit (HOSPITAL_COMMUNITY)
Admission: RE | Admit: 2015-08-16 | Discharge: 2015-08-16 | Disposition: A | Discharge status: Home / Self Care | Payer: Medicare Other | Source: Ambulatory Visit | Attending: Internal Medicine | Admitting: Internal Medicine

## 2015-08-16 ENCOUNTER — Other Ambulatory Visit (HOSPITAL_COMMUNITY): Payer: Self-pay | Admitting: *Deleted

## 2015-08-16 DIAGNOSIS — D649 Anemia, unspecified: Secondary | ICD-10-CM | POA: Diagnosis not present

## 2015-08-16 LAB — PREPARE RBC (CROSSMATCH)

## 2015-08-17 ENCOUNTER — Ambulatory Visit (HOSPITAL_COMMUNITY)
Admission: RE | Admit: 2015-08-17 | Discharge: 2015-08-17 | Disposition: A | Discharge status: Home / Self Care | Payer: Medicare Other | Source: Ambulatory Visit | Attending: Internal Medicine | Admitting: Internal Medicine

## 2015-08-17 ENCOUNTER — Ambulatory Visit: Payer: Medicare HMO | Admitting: Neurology

## 2015-08-17 DIAGNOSIS — D649 Anemia, unspecified: Secondary | ICD-10-CM | POA: Diagnosis not present

## 2015-08-17 MED ORDER — SODIUM CHLORIDE 0.9 % IV SOLN: Freq: Once | INTRAVENOUS | Status: DC

## 2015-08-18 LAB — TYPE AND SCREEN
ABO/RH(D): O POS
ANTIBODY SCREEN: POSITIVE
DAT, IGG: NEGATIVE
DONOR AG TYPE: NEGATIVE
Donor AG Type: NEGATIVE
PT AG Type: NEGATIVE
UNIT DIVISION: 0
Unit division: 0

## 2015-08-22 ENCOUNTER — Encounter: Payer: Medicare HMO | Admitting: Internal Medicine

## 2015-08-22 NOTE — Progress Notes (Signed)
Subjective:    Patient ID: Martin Cain, male    DOB: Feb 13, 1946, 70 y.o.   MRN: [id number]  HPI Error - no show  Medications and allergies reviewed with patient and updated if appropriate.  Patient Active Problem List   Diagnosis Date Noted  . Weight loss, unintentional 07/29/2015  . Frailty 07/29/2015  . Acute delirium 07/29/2015  . Hypernatremia   . Protein-calorie malnutrition, moderate (HCC)   . Encephalopathy 07/27/2015  . Cataract 06/14/2015  . CAD (coronary artery disease) 06/14/2015  . COPD (chronic obstructive pulmonary disease) (HCC) 06/14/2015  . Tremors of nervous system 05/16/2015  . AKI (acute kidney injury) (HCC) 05/16/2015  . Hyperkalemia 05/16/2015  . Chronic combined systolic and diastolic congestive heart failure (HCC) 05/16/2015  . Normocytic anemia 05/16/2015  . Supranuclear palsies, progressive (HCC) 05/16/2015  . Essential hypertension 05/16/2015  . CKD (chronic kidney disease), stage III 05/16/2015  . Progressive supranuclear palsies (HCC)   . Pressure ulcer 05/09/2015  . Protein-calorie malnutrition, severe 05/09/2015  . Anemia 05/08/2015  . Absolute anemia   . Dysphagia   . Palliative care encounter   . Symptomatic anemia 04/17/2015  . Malnutrition of moderate degree (HCC) 06/21/2013  . HTN (hypertension) 06/20/2013  . Acute on chronic renal failure (HCC) 05/30/2013  . Acute on chronic combined systolic and diastolic CHF (congestive heart failure) (HCC) 05/28/2013  . Acute exacerbation of CHF (congestive heart failure) (HCC) 05/27/2013  . Elevated troponin 05/27/2013  . Gout flare 05/27/2013  . HTN (hypertension), malignant 05/27/2013    Current Outpatient Prescriptions on File Prior to Visit  Medication Sig Dispense Refill  . acetaminophen (TYLENOL) 325 MG tablet Take 2 tablets (650 mg total) by mouth every 6 (six) hours as needed for mild pain (or Fever >/= 101). 30 tablet 0  . albuterol (PROVENTIL HFA;VENTOLIN HFA) 108 (90 Base)  MCG/ACT inhaler Inhale 2 puffs into the lungs every 6 (six) hours as needed for wheezing or shortness of breath. 1 Inhaler 3  . allopurinol (ZYLOPRIM) 100 MG tablet Take 1 tablet (100 mg total) by mouth daily. 30 tablet 5  . amLODipine (NORVASC) 10 MG tablet Take 1 tablet (10 mg total) by mouth daily. 30 tablet 5  . atorvastatin (LIPITOR) 80 MG tablet Take 1 tablet (80 mg total) by mouth daily. 30 tablet 5  . carvedilol (COREG) 25 MG tablet Take 1 tablet (25 mg total) by mouth 2 (two) times daily. 60 tablet 5  . Cholecalciferol (VITAMIN D PO) Place 1,000 mg into feeding tube daily.     . colchicine 0.6 MG tablet Take 1 tablet (0.6 mg total) by mouth daily. 30 tablet 5  . [facility] ASPIRIN LOW DOSE 81 MG EC tablet Take 81 mg by mouth daily.  0  . Cyanocobalamin (VITAMIN B-12 PO) Place 1,000 mg into feeding tube daily.     . ferrous sulfate 325 (65 FE) MG tablet Take 1 tablet (325 mg total) by mouth every morning. 30 tablet 5  . Fluticasone-Salmeterol (ADVAIR) 250-50 MCG/DOSE AEPB Inhale 1 puff into the lungs 2 (two) times daily. 60 each 1  . folic acid (FOLVITE) 1 MG tablet Place 1 mg into feeding tube daily.   3  . hydrALAZINE (APRESOLINE) 100 MG tablet Take 1 tablet (100 mg total) by mouth every morning. 30 tablet 5  . hydrocortisone (ANUSOL-HC) 2.5 % rectal cream Place rectally 4 (four) times daily. 30 g 0  . ipratropium-albuterol (DUONEB) 0.5-2.5 (3) MG/3ML SOLN Inhale 3 mLs into the lungs  every 6 (six) hours as needed (shortness of breath). 360 mL 1  . isosorbide dinitrate (ISORDIL) 40 MG tablet Take 1 tablet (40 mg total) by mouth daily. 30 tablet 5  . levETIRAcetam (KEPPRA) 500 MG tablet Take 1 tablet (500 mg total) by mouth 2 (two) times daily. 60 tablet 5  . LORazepam (ATIVAN) 1 MG tablet Take 1 tablet (1 mg total) by mouth every 6 (six) hours as needed for anxiety. 30 tablet 1  . nitroGLYCERIN (NITROSTAT) 0.4 MG SL tablet Place 0.4 mg under the tongue every 5 (five) minutes as needed for chest  pain.    . Nutritional Supplements (FEEDING SUPPLEMENT, JEVITY 1.5 CAL/FIBER,) LIQD Place 240 mLs into feeding tube 6 (six) times daily. 350 mL 0  . pantoprazole sodium (PROTONIX) 40 mg/20 mL PACK Place 40 mg into feeding tube daily. In 30 ml of applesauce.    . thiamine 100 MG tablet Take 1 tablet (100 mg total) by mouth daily. (Patient taking differently: Place 100 mg into feeding tube daily. )    . Water For Irrigation, Sterile (FREE WATER) SOLN Place 100 mLs into feeding tube 5 (five) times daily.     No current facility-administered medications on file prior to visit.    Past Medical History  Diagnosis Date  . Hypertension   . Hyperlipidemia   . CKD (chronic kidney disease)     a. Probable stage III.  Marland Kitchen CAD (coronary artery disease)     a. CABG 2000 in [place]. b. NSTEMI 04/2013 felt due to malignant hypertension (was instructed to f/u ATL for stress test).  . Gout   . CHF (congestive heart failure) (HCC)     a. Echo 04/2013: EF 45-50%, mild AS, mild biatrial enlargement, sev LVH.  . Osteoarthritis     R knee  . Anemia   . Progressive supranuclear palsies (HCC)   . Frailty 07/29/2015  . Acute delirium 07/29/2015    Past Surgical History  Procedure Laterality Date  . Cardiac surgery      2000; [place]  . Appendectomy    . Abdominal exploration surgery    . Tonsillectomy    . Flexible sigmoidoscopy Left 05/11/2015    Procedure: FLEXIBLE SIGMOIDOSCOPY;  Surgeon: Willis Modena, MD;  Location: [facility];  Service: Endoscopy;  Laterality: Left;    Social History   Social History  . Marital Status: Divorced    Spouse Name: N/A  . Number of Children: 2  . Years of Education: N/A   Social History Main Topics  . Smoking status: Former Smoker -- 0.50 packs/day for 40 years    Types: Cigarettes  . Smokeless tobacco: Never Used  . Alcohol Use: 0.0 oz/week    0 Standard drinks or equivalent per week     Comment: previously admitted to 1 pint per day. denies use for 1  month.  . Drug Use: No  . Sexual Activity: Not on file   Other Topics Concern  . Not on file   Social History Narrative   Currently lives at [facility].     Retired from being an Lexicographer.    Family History  Problem Relation Age of Onset  . Heart disease      No family history  . Cancer Father   . Hypertension Father   . Cancer Brother   . Cancer Sister   . Healthy Daughter     Review of Systems     Objective:  There were no vitals filed  for this visit. There were no vitals filed for this visit. There is no weight on file to calculate BMI.   Physical Exam        Assessment & Plan:    This encounter was created in error - please disregard.

## 2015-08-23 ENCOUNTER — Telehealth: Payer: Self-pay

## 2015-08-23 NOTE — Telephone Encounter (Signed)
Home Health Cert/Plan of Care received (07/03/2015 - 08/31/2015) signed. Faxed, copy sent to scan

## 2015-08-25 DIAGNOSIS — E440 Moderate protein-calorie malnutrition: Secondary | ICD-10-CM | POA: Diagnosis not present

## 2015-08-30 NOTE — Progress Notes (Signed)
Late entry for missed G-code. Based on review of the evaluation and goals by Mardene Celeste, PT.    08-19-15 1501  PT G-Codes **NOT FOR INPATIENT CLASS**  Functional Assessment Tool Used Clinical judgement based on review of the medical record  Functional Limitation Mobility: Walking and moving around  Mobility: Walking and Moving Around Current Status (Z6109) CM  Mobility: Walking and Moving Around Goal Status [phone]) CK  Lavona Mound, PT  [phone] 08/30/2015

## 2015-09-17 NOTE — Progress Notes (Signed)
07/28/15 1000  SLP Visit Information  SLP Received On 07/28/15  General Information  Date of Onset 07/27/15  HPI [name] is a 70 y.o. male presenting with increased shaking . PMH is significant for COPD, CAD, Chronic combined systolic and diastolic congestive heart failure, progressive supranuclear palsies, gout, HTN, HLD. 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   Type of Study Bedside Swallow Evaluation  Previous Swallow Assessment FEES 10-16 and MBS 11-16 noted moderate oral severe pharyngeal dypshagia   Diet Prior to this Study NPO  Temperature Spikes Noted No  Respiratory Status Room air  History of Recent Intubation No  Behavior/Cognition Alert  Oral Cavity Assessment Dry;Lesions;Dried secretions  Oral Care Completed by SLP Yes  Oral Cavity - Dentition Missing dentition  Self-Feeding Abilities Needs assist  Patient Positioning Upright in bed  Baseline Vocal Quality Low vocal intensity;Hoarse  Volitional Cough Weak  Volitional Swallow Unable to elicit  Oral Assessment (Complete on admission/transfer/change in patient condition)  Does patient have any of the following "high risk" factors? Diet - patient on tube feedings  Oral Motor/Sensory Function  Overall Oral Motor/Sensory Function Moderate impairment  Lingual ROM Reduced right;Reduced left  Lingual Strength Reduced  Ice Chips  Ice chips Impaired  Thin Liquid  Thin Liquid Impaired  Presentation Cup  Oral Phase Impairments Reduced lingual movement/coordination  Oral Phase Functional Implications Prolonged oral transit  Pharyngeal  Phase Impairments Suspected delayed Swallow;Wet Vocal Quality;Multiple swallows  Nectar Thick Liquid  Nectar Thick Liquid Impaired  Oral Phase Impairments Reduced lingual movement/coordination  Oral phase functional implications Prolonged oral transit  Pharyngeal Phase Impairments Suspected  delayed Swallow;Multiple swallows;Wet Vocal Quality  Honey Thick Liquid  Honey Thick Liquid NT  Puree  Puree Impaired  Pharyngeal Phase Impairments Wet Vocal Quality;Multiple swallows;Decreased hyoid-laryngeal movement;Suspected delayed Swallow  Solid  Solid Impaired  Oral Phase Impairments Impaired mastication;Reduced labial seal;Reduced lingual movement/coordination  Oral Phase Functional Implications Prolonged oral transit;Oral residue;Impaired mastication  Pharyngeal Phase Impairments Suspected delayed Swallow;Cough - Immediate;Multiple swallows;Decreased hyoid-laryngeal movement  SLP - End of Session  Patient left in bed;with call bell/phone within reach  Nurse Communication Aspiration precautions reviewed;Diet recommendation  SLP Assessment  Clinical Impression Statement (ACUTE ONLY) Pt with extensive hx of dysphagia (moderate oral and severe pharyngeal).  with 2 objective swallow studies in the past year (FEES 10-16 and MBS 11-16).   Impact on safety and function Severe aspiration risk  Other Related Risk Factors History of dysphagia;History of esophageal-related issues;Decreased management of secretions;Decreased respiratory status;Deconditioning  Swallow Evaluation Recommendations  Medication Administration Via alternative means  Treatment Plan  Oral Care Recommendations Oral care QID  Treatment Recommendations Therapy as outlined in treatment plan below  Follow up Recommendations 24 hour supervision/assistance  Speech Therapy Frequency (ACUTE ONLY) min 2x/week  Treatment Duration 2 weeks  Interventions Aspiration precaution training;Patient/family education;Diet toleration management by SLP  Prognosis  Prognosis for Safe Diet Advancement Guarded  Barriers to Reach Goals Severity of deficits  Individuals Consulted  Consulted and Agree with Results and Recommendations MD;RN;Patient  SLP Time Calculation  SLP Start Time (ACUTE ONLY) 1030  SLP Stop Time (ACUTE ONLY) 1102  SLP  Time Calculation (min) (ACUTE ONLY) 32 min  SLP G-Codes **NOT FOR INPATIENT CLASS**  Functional Assessment Tool Used skilled clinical judgement via chart review  Functional Limitations Swallowing  Swallow Current Status (Z6109(G8996) CM  Swallow Goal Status (U0454(G8997) CL  SLP Evaluations  $ SLP  Speech Visit 1 Procedure  SLP Evaluations  $BSS Swallow 1 Procedure  Ferdinand Lango MA, CCC-SLP [phone]

## 2015-09-20 ENCOUNTER — Encounter: Payer: Self-pay | Admitting: Cardiology

## 2015-09-20 ENCOUNTER — Ambulatory Visit (INDEPENDENT_AMBULATORY_CARE_PROVIDER_SITE_OTHER): Payer: Medicare Other | Admitting: Cardiology

## 2015-09-20 VITALS — Ht 69.0 in | Wt 118.0 lb

## 2015-09-20 DIAGNOSIS — I5042 Chronic combined systolic (congestive) and diastolic (congestive) heart failure: Secondary | ICD-10-CM

## 2015-09-20 DIAGNOSIS — E785 Hyperlipidemia, unspecified: Secondary | ICD-10-CM

## 2015-09-20 DIAGNOSIS — I110 Hypertensive heart disease with heart failure: Secondary | ICD-10-CM | POA: Diagnosis not present

## 2015-09-20 DIAGNOSIS — I255 Ischemic cardiomyopathy: Secondary | ICD-10-CM | POA: Diagnosis not present

## 2015-09-20 DIAGNOSIS — I2583 Coronary atherosclerosis due to lipid rich plaque: Secondary | ICD-10-CM

## 2015-09-20 DIAGNOSIS — I2510 Atherosclerotic heart disease of native coronary artery without angina pectoris: Secondary | ICD-10-CM

## 2015-09-20 NOTE — Progress Notes (Signed)
Patient ID: Martin Cain, male   DOB: 02/09/1946, 70 y.o.   MRN: [id number]    HPI: 70 year old male with h/o chronic combined systolic and diastolic CHF, CAD, s/p CABG in 2000, CKD stage 3, etoh abuse, medication non-compliance, HTN, COPD,  who was visiting from [place] and was admitted with NSTEMI  and CHF twice in the 2015, that was believed to be secondary to malignant hypertension and acute on chronic renal failure. He was discharged on BP meds that he didn't pick up and was not using. Afterwards he was non-complaint with his meds. His Lexiscan nuclear stress test in 05/2013 was negative for infarct or ischemia, LVEF was 42% with diffuse hypokinesis.  09/20/2015 3 months follow up, he now lives in a nursing home, on feeding tube, continues to loose weight, off lasix, difficult to interview, he is somnolent, falling asleep during conversation, appears cachectic.  Denies chest pain, DOE, in a wheelchair, no LE edema, palpitations or syncope. No orthopnea.   Filed Vitals:   09/20/15 1046  Height: 5\' 9"  (1.753 m)  Weight: 118 lb (53.524 kg)    Intake/Output Summary (Last 24 hours) at 05/31/13 [id number] Last data filed at 05/30/13 2300  Gross per 24 hour  Intake    720 ml  Output    550 ml  Net    170 ml    SUBJECTIVE Patient mentally much clearer today. Denies chest pain or SOB. Biggest complaint is of leg pain- this is improved with oxycontin.  LABS: Basic Metabolic Panel: No results for input(s): NA, K, CL, CO2, GLUCOSE, BUN, CREATININE, CALCIUM, MG, PHOS in the last 72 hours.  BNP: 6983  Radiology/Studies:  Dg Chest 2 View  05/27/2013   CLINICAL DATA:  Hypertension, peripheral swelling  EXAM: CHEST  2 VIEW  COMPARISON:  None  FINDINGS: Cardiac shadow is mildly enlarged. Postsurgical changes are seen. The lungs are well aerated bilaterally without focal infiltrate or sizable effusion. No acute bony abnormality is noted.  IMPRESSION: No acute abnormality noted.   Electronically Signed    By: Alcide CleverMark  Lukens M.D.   On: 05/27/2013 10:10   MRI HEAD WITHOUT CONTRAST  TECHNIQUE:  Multiplanar, multiecho pulse sequences of the brain and surrounding  structures were obtained without intravenous contrast.  COMPARISON: None.  FINDINGS:  Exam is motion degraded.  No acute infarct.  No intracranial hemorrhage.  Remote small right basal ganglia/thalamic infarct. Remote tiny right  paracentral pontine infarct. Remote tiny left cerebellar infarct.  Mild small vessel disease type changes.  Atrophy. Ventricular prominence felt to be related to atrophy rather  than hydrocephalus.  No intracranial mass lesion noted on this unenhanced exam.  Small right vertebral artery with superimposed atherosclerotic type  changes and significant narrowing. Left vertebral artery, basilar  artery and the internal carotid arteries as well as major dural  sinuses are patent.  Minimal spinal stenosis C3-4. Cervical medullary junction, pituitary  region, pineal region and orbital structures unremarkable.  Minimal paranasal sinus mucosal thickening.  IMPRESSION:  Exam is motion degraded.  No acute infarct.  Remote small right basal ganglia/thalamic infarct. Remote tiny right  paracentral pontine infarct. Remote tiny left cerebellar infarct.  Mild small vessel disease type changes.  Atrophy.  Small right vertebral artery with superimposed atherosclerotic type  changes and significant narrowing.  Electronically Signed  By: Bridgett LarssonSteve Olson M.D.  On: 05/30/2013 17:15   Ecg: NSR, LAD, LVH with repolarization abnormality.  PHYSICAL EXAM General: Thin, frail, alert Head: Normal. Neck: Negative for carotid  bruits. JVD not elevated. Lungs: Clear bilaterally to auscultation without wheezes, rales, or rhonchi. Breathing is unlabored. Heart: RRR S1 S2 without murmurs, rubs, or gallops.  Abdomen: Soft, non-tender, non-distended with normoactive bowel sounds. No hepatomegaly. Extremities: No clubbing, cyanosis,  ankle edema B/L. Swelling and tenderness of he right knee.  Distal pedal pulses are 2+ and equal bilaterally. Neuro: alert oriented x 3.  Lexiscan nuclear stress test: 05/23/2013 IMPRESSION: 1. Matched attenuation involving the inferior wall of the left ventricle without associated discrete wall motion abnormality. No definite scintigraphic evidence of prior infarction or pharmacologically induced ischemia. 2. Mild dilatation of the left ventricular cavity (end-diastolic volume - 191 ml). 3. Mild global hypokinesia. Ejection fraction - 42%.  Electronically Signed By: Simonne Come M.D. On: 06/22/2013 11:59  TTE: 05/18/2015 Left ventricle: LV is coursely trabeculated. The cavity size was mildly dilated. Wall thickness was increased in a pattern of mild LVH. There was moderate concentric hypertrophy. Systolic function was moderately to severely reduced. The estimated ejection fraction was in the range of 30% to 35%. Doppler parameters are consistent with abnormal left ventricular relaxation (grade 1 diastolic dysfunction). - Aortic valve: AV is thickened, calcified with restricted motion Peak and mean gradients through the valve aer 25 and 15 mm Hg respectively 2 D imaging suggests that AS is moderate. There was mild regurgitation. - Right ventricle: Systolic function was moderately reduced. - Pericardium, extracardiac: A trivial pericardial effusion was identified. There was a left pleural effusion.    ASSESSMENT AND PLAN:  1.Cronic combined systolic and diastolic CHF, off lasix, continues to loose weight, 148--> 126-->118 lbs, appears cachectic and dehydrated. Repeat TTE in 05/2015 showed LVEF 30-35%.  euvolemic.  2. CAD, h/o CABG in 2000, Elevated troponin chronically, on both hospital visits. Asymptomatic. Continue ASA, BB, ACEI and statin.  3. Malignant Hypertension. Controlled now.  4. Hyperlipidemia - on Crestor  5. Smoking - now off in  [place]  Follow up in 6 months.  Rico Sheehan MD,FACC 09/20/2015 11:06 AM

## 2015-09-20 NOTE — Patient Instructions (Signed)
Your physician recommends that you continue on your current medications as directed. Please refer to the Current Medication list given to you today.     Your physician wants you to follow-up in: 6 MONTHS WITH DR NELSON You will receive a reminder letter in the mail two months in advance. If you don't receive a letter, please call our office to schedule the follow-up appointment.  

## 2015-09-21 DIAGNOSIS — G231 Progressive supranuclear ophthalmoplegia [Steele-Richardson-Olszewski]: Secondary | ICD-10-CM | POA: Diagnosis not present

## 2015-09-21 DIAGNOSIS — R1310 Dysphagia, unspecified: Secondary | ICD-10-CM | POA: Diagnosis not present

## 2015-09-21 DIAGNOSIS — R569 Unspecified convulsions: Secondary | ICD-10-CM | POA: Diagnosis not present

## 2015-09-21 DIAGNOSIS — R251 Tremor, unspecified: Secondary | ICD-10-CM | POA: Diagnosis not present

## 2015-09-22 DIAGNOSIS — E440 Moderate protein-calorie malnutrition: Secondary | ICD-10-CM | POA: Diagnosis not present

## 2015-09-26 NOTE — Progress Notes (Signed)
OT Note - Addendum    07/28/15 1000  OT Visit Information  Last OT Received On 09/26/15  OT G-codes **NOT FOR INPATIENT CLASS**  Functional Assessment Tool Used clinical judgement  Functional Limitation Self care  Self Care Current Status [phone](G8987) CL  Self Care Goal Status (U0454(G8988) CI  [facility] Daijah Scrivens, OTR/L  [phone] 07/28/2015

## 2015-10-05 DIAGNOSIS — E785 Hyperlipidemia, unspecified: Secondary | ICD-10-CM | POA: Diagnosis not present

## 2015-10-05 DIAGNOSIS — D649 Anemia, unspecified: Secondary | ICD-10-CM | POA: Diagnosis not present

## 2015-10-05 DIAGNOSIS — Z0130 Encounter for examination of blood pressure without abnormal findings: Secondary | ICD-10-CM | POA: Diagnosis not present

## 2015-10-05 DIAGNOSIS — I10 Essential (primary) hypertension: Secondary | ICD-10-CM | POA: Diagnosis not present

## 2015-10-23 DIAGNOSIS — E440 Moderate protein-calorie malnutrition: Secondary | ICD-10-CM | POA: Diagnosis not present

## 2015-10-25 DIAGNOSIS — I5020 Unspecified systolic (congestive) heart failure: Secondary | ICD-10-CM | POA: Diagnosis not present

## 2015-10-25 DIAGNOSIS — R1310 Dysphagia, unspecified: Secondary | ICD-10-CM | POA: Diagnosis not present

## 2015-10-25 DIAGNOSIS — R2689 Other abnormalities of gait and mobility: Secondary | ICD-10-CM | POA: Diagnosis not present

## 2015-10-25 DIAGNOSIS — I5040 Unspecified combined systolic (congestive) and diastolic (congestive) heart failure: Secondary | ICD-10-CM | POA: Diagnosis not present

## 2015-10-25 DIAGNOSIS — E46 Unspecified protein-calorie malnutrition: Secondary | ICD-10-CM | POA: Diagnosis not present

## 2015-10-25 DIAGNOSIS — G231 Progressive supranuclear ophthalmoplegia [Steele-Richardson-Olszewski]: Secondary | ICD-10-CM | POA: Diagnosis not present

## 2015-10-25 DIAGNOSIS — F0391 Unspecified dementia with behavioral disturbance: Secondary | ICD-10-CM | POA: Diagnosis not present

## 2015-10-26 DIAGNOSIS — I5020 Unspecified systolic (congestive) heart failure: Secondary | ICD-10-CM | POA: Diagnosis not present

## 2015-10-26 DIAGNOSIS — G231 Progressive supranuclear ophthalmoplegia [Steele-Richardson-Olszewski]: Secondary | ICD-10-CM | POA: Diagnosis not present

## 2015-10-26 DIAGNOSIS — R1310 Dysphagia, unspecified: Secondary | ICD-10-CM | POA: Diagnosis not present

## 2015-10-26 DIAGNOSIS — F0391 Unspecified dementia with behavioral disturbance: Secondary | ICD-10-CM | POA: Diagnosis not present

## 2015-10-26 DIAGNOSIS — E46 Unspecified protein-calorie malnutrition: Secondary | ICD-10-CM | POA: Diagnosis not present

## 2015-10-27 DIAGNOSIS — R2689 Other abnormalities of gait and mobility: Secondary | ICD-10-CM | POA: Diagnosis not present

## 2015-10-27 DIAGNOSIS — I5040 Unspecified combined systolic (congestive) and diastolic (congestive) heart failure: Secondary | ICD-10-CM | POA: Diagnosis not present

## 2015-10-30 DIAGNOSIS — G231 Progressive supranuclear ophthalmoplegia [Steele-Richardson-Olszewski]: Secondary | ICD-10-CM | POA: Diagnosis not present

## 2015-10-30 DIAGNOSIS — J449 Chronic obstructive pulmonary disease, unspecified: Secondary | ICD-10-CM | POA: Diagnosis not present

## 2015-10-30 DIAGNOSIS — H259 Unspecified age-related cataract: Secondary | ICD-10-CM | POA: Diagnosis not present

## 2015-10-30 DIAGNOSIS — I10 Essential (primary) hypertension: Secondary | ICD-10-CM | POA: Diagnosis not present

## 2015-10-30 DIAGNOSIS — D631 Anemia in chronic kidney disease: Secondary | ICD-10-CM | POA: Diagnosis not present

## 2015-10-30 DIAGNOSIS — M6281 Muscle weakness (generalized): Secondary | ICD-10-CM | POA: Diagnosis not present

## 2015-10-30 DIAGNOSIS — R1312 Dysphagia, oropharyngeal phase: Secondary | ICD-10-CM | POA: Diagnosis not present

## 2015-10-30 DIAGNOSIS — R2689 Other abnormalities of gait and mobility: Secondary | ICD-10-CM | POA: Diagnosis not present

## 2015-10-30 DIAGNOSIS — M109 Gout, unspecified: Secondary | ICD-10-CM | POA: Diagnosis not present

## 2015-10-30 DIAGNOSIS — I5040 Unspecified combined systolic (congestive) and diastolic (congestive) heart failure: Secondary | ICD-10-CM | POA: Diagnosis not present

## 2015-10-30 DIAGNOSIS — N183 Chronic kidney disease, stage 3 (moderate): Secondary | ICD-10-CM | POA: Diagnosis not present

## 2015-11-01 DIAGNOSIS — I5040 Unspecified combined systolic (congestive) and diastolic (congestive) heart failure: Secondary | ICD-10-CM | POA: Diagnosis not present

## 2015-11-01 DIAGNOSIS — M109 Gout, unspecified: Secondary | ICD-10-CM | POA: Diagnosis not present

## 2015-11-01 DIAGNOSIS — R2689 Other abnormalities of gait and mobility: Secondary | ICD-10-CM | POA: Diagnosis not present

## 2015-11-01 DIAGNOSIS — H259 Unspecified age-related cataract: Secondary | ICD-10-CM | POA: Diagnosis not present

## 2015-11-01 DIAGNOSIS — N183 Chronic kidney disease, stage 3 (moderate): Secondary | ICD-10-CM | POA: Diagnosis not present

## 2015-11-01 DIAGNOSIS — R1312 Dysphagia, oropharyngeal phase: Secondary | ICD-10-CM | POA: Diagnosis not present

## 2015-11-01 DIAGNOSIS — G231 Progressive supranuclear ophthalmoplegia [Steele-Richardson-Olszewski]: Secondary | ICD-10-CM | POA: Diagnosis not present

## 2015-11-01 DIAGNOSIS — J449 Chronic obstructive pulmonary disease, unspecified: Secondary | ICD-10-CM | POA: Diagnosis not present

## 2015-11-01 DIAGNOSIS — M6281 Muscle weakness (generalized): Secondary | ICD-10-CM | POA: Diagnosis not present

## 2015-11-01 DIAGNOSIS — D631 Anemia in chronic kidney disease: Secondary | ICD-10-CM | POA: Diagnosis not present

## 2015-11-01 DIAGNOSIS — I10 Essential (primary) hypertension: Secondary | ICD-10-CM | POA: Diagnosis not present

## 2015-11-02 DIAGNOSIS — I10 Essential (primary) hypertension: Secondary | ICD-10-CM | POA: Diagnosis not present

## 2015-11-02 DIAGNOSIS — J449 Chronic obstructive pulmonary disease, unspecified: Secondary | ICD-10-CM | POA: Diagnosis not present

## 2015-11-02 DIAGNOSIS — I5040 Unspecified combined systolic (congestive) and diastolic (congestive) heart failure: Secondary | ICD-10-CM | POA: Diagnosis not present

## 2015-11-02 DIAGNOSIS — D631 Anemia in chronic kidney disease: Secondary | ICD-10-CM | POA: Diagnosis not present

## 2015-11-02 DIAGNOSIS — R2689 Other abnormalities of gait and mobility: Secondary | ICD-10-CM | POA: Diagnosis not present

## 2015-11-02 DIAGNOSIS — G231 Progressive supranuclear ophthalmoplegia [Steele-Richardson-Olszewski]: Secondary | ICD-10-CM | POA: Diagnosis not present

## 2015-11-02 DIAGNOSIS — N183 Chronic kidney disease, stage 3 (moderate): Secondary | ICD-10-CM | POA: Diagnosis not present

## 2015-11-02 DIAGNOSIS — M6281 Muscle weakness (generalized): Secondary | ICD-10-CM | POA: Diagnosis not present

## 2015-11-02 DIAGNOSIS — H259 Unspecified age-related cataract: Secondary | ICD-10-CM | POA: Diagnosis not present

## 2015-11-02 DIAGNOSIS — R1312 Dysphagia, oropharyngeal phase: Secondary | ICD-10-CM | POA: Diagnosis not present

## 2015-11-02 DIAGNOSIS — M109 Gout, unspecified: Secondary | ICD-10-CM | POA: Diagnosis not present

## 2015-11-03 DIAGNOSIS — I10 Essential (primary) hypertension: Secondary | ICD-10-CM | POA: Diagnosis not present

## 2015-11-03 DIAGNOSIS — R1312 Dysphagia, oropharyngeal phase: Secondary | ICD-10-CM | POA: Diagnosis not present

## 2015-11-03 DIAGNOSIS — N183 Chronic kidney disease, stage 3 (moderate): Secondary | ICD-10-CM | POA: Diagnosis not present

## 2015-11-03 DIAGNOSIS — J449 Chronic obstructive pulmonary disease, unspecified: Secondary | ICD-10-CM | POA: Diagnosis not present

## 2015-11-03 DIAGNOSIS — M109 Gout, unspecified: Secondary | ICD-10-CM | POA: Diagnosis not present

## 2015-11-03 DIAGNOSIS — G231 Progressive supranuclear ophthalmoplegia [Steele-Richardson-Olszewski]: Secondary | ICD-10-CM | POA: Diagnosis not present

## 2015-11-03 DIAGNOSIS — D631 Anemia in chronic kidney disease: Secondary | ICD-10-CM | POA: Diagnosis not present

## 2015-11-03 DIAGNOSIS — I5040 Unspecified combined systolic (congestive) and diastolic (congestive) heart failure: Secondary | ICD-10-CM | POA: Diagnosis not present

## 2015-11-03 DIAGNOSIS — H259 Unspecified age-related cataract: Secondary | ICD-10-CM | POA: Diagnosis not present

## 2015-11-03 DIAGNOSIS — R2689 Other abnormalities of gait and mobility: Secondary | ICD-10-CM | POA: Diagnosis not present

## 2015-11-03 DIAGNOSIS — M6281 Muscle weakness (generalized): Secondary | ICD-10-CM | POA: Diagnosis not present

## 2015-11-05 DIAGNOSIS — I10 Essential (primary) hypertension: Secondary | ICD-10-CM | POA: Diagnosis not present

## 2015-11-05 DIAGNOSIS — G231 Progressive supranuclear ophthalmoplegia [Steele-Richardson-Olszewski]: Secondary | ICD-10-CM | POA: Diagnosis not present

## 2015-11-05 DIAGNOSIS — R1312 Dysphagia, oropharyngeal phase: Secondary | ICD-10-CM | POA: Diagnosis not present

## 2015-11-05 DIAGNOSIS — N183 Chronic kidney disease, stage 3 (moderate): Secondary | ICD-10-CM | POA: Diagnosis not present

## 2015-11-05 DIAGNOSIS — M109 Gout, unspecified: Secondary | ICD-10-CM | POA: Diagnosis not present

## 2015-11-05 DIAGNOSIS — I5040 Unspecified combined systolic (congestive) and diastolic (congestive) heart failure: Secondary | ICD-10-CM | POA: Diagnosis not present

## 2015-11-05 DIAGNOSIS — D631 Anemia in chronic kidney disease: Secondary | ICD-10-CM | POA: Diagnosis not present

## 2015-11-05 DIAGNOSIS — R2689 Other abnormalities of gait and mobility: Secondary | ICD-10-CM | POA: Diagnosis not present

## 2015-11-05 DIAGNOSIS — J449 Chronic obstructive pulmonary disease, unspecified: Secondary | ICD-10-CM | POA: Diagnosis not present

## 2015-11-05 DIAGNOSIS — M6281 Muscle weakness (generalized): Secondary | ICD-10-CM | POA: Diagnosis not present

## 2015-11-05 DIAGNOSIS — H259 Unspecified age-related cataract: Secondary | ICD-10-CM | POA: Diagnosis not present

## 2015-11-06 DIAGNOSIS — I5040 Unspecified combined systolic (congestive) and diastolic (congestive) heart failure: Secondary | ICD-10-CM | POA: Diagnosis not present

## 2015-11-06 DIAGNOSIS — N183 Chronic kidney disease, stage 3 (moderate): Secondary | ICD-10-CM | POA: Diagnosis not present

## 2015-11-06 DIAGNOSIS — H259 Unspecified age-related cataract: Secondary | ICD-10-CM | POA: Diagnosis not present

## 2015-11-06 DIAGNOSIS — J449 Chronic obstructive pulmonary disease, unspecified: Secondary | ICD-10-CM | POA: Diagnosis not present

## 2015-11-06 DIAGNOSIS — G231 Progressive supranuclear ophthalmoplegia [Steele-Richardson-Olszewski]: Secondary | ICD-10-CM | POA: Diagnosis not present

## 2015-11-06 DIAGNOSIS — I10 Essential (primary) hypertension: Secondary | ICD-10-CM | POA: Diagnosis not present

## 2015-11-06 DIAGNOSIS — R2689 Other abnormalities of gait and mobility: Secondary | ICD-10-CM | POA: Diagnosis not present

## 2015-11-06 DIAGNOSIS — R1312 Dysphagia, oropharyngeal phase: Secondary | ICD-10-CM | POA: Diagnosis not present

## 2015-11-06 DIAGNOSIS — M6281 Muscle weakness (generalized): Secondary | ICD-10-CM | POA: Diagnosis not present

## 2015-11-06 DIAGNOSIS — D631 Anemia in chronic kidney disease: Secondary | ICD-10-CM | POA: Diagnosis not present

## 2015-11-06 DIAGNOSIS — M109 Gout, unspecified: Secondary | ICD-10-CM | POA: Diagnosis not present

## 2015-11-08 DIAGNOSIS — I5040 Unspecified combined systolic (congestive) and diastolic (congestive) heart failure: Secondary | ICD-10-CM | POA: Diagnosis not present

## 2015-11-08 DIAGNOSIS — I10 Essential (primary) hypertension: Secondary | ICD-10-CM | POA: Diagnosis not present

## 2015-11-08 DIAGNOSIS — G231 Progressive supranuclear ophthalmoplegia [Steele-Richardson-Olszewski]: Secondary | ICD-10-CM | POA: Diagnosis not present

## 2015-11-08 DIAGNOSIS — J449 Chronic obstructive pulmonary disease, unspecified: Secondary | ICD-10-CM | POA: Diagnosis not present

## 2015-11-08 DIAGNOSIS — M109 Gout, unspecified: Secondary | ICD-10-CM | POA: Diagnosis not present

## 2015-11-08 DIAGNOSIS — H259 Unspecified age-related cataract: Secondary | ICD-10-CM | POA: Diagnosis not present

## 2015-11-08 DIAGNOSIS — D631 Anemia in chronic kidney disease: Secondary | ICD-10-CM | POA: Diagnosis not present

## 2015-11-08 DIAGNOSIS — R1312 Dysphagia, oropharyngeal phase: Secondary | ICD-10-CM | POA: Diagnosis not present

## 2015-11-08 DIAGNOSIS — R2689 Other abnormalities of gait and mobility: Secondary | ICD-10-CM | POA: Diagnosis not present

## 2015-11-08 DIAGNOSIS — N183 Chronic kidney disease, stage 3 (moderate): Secondary | ICD-10-CM | POA: Diagnosis not present

## 2015-11-08 DIAGNOSIS — M6281 Muscle weakness (generalized): Secondary | ICD-10-CM | POA: Diagnosis not present

## 2015-11-09 DIAGNOSIS — G231 Progressive supranuclear ophthalmoplegia [Steele-Richardson-Olszewski]: Secondary | ICD-10-CM | POA: Diagnosis not present

## 2015-11-09 DIAGNOSIS — I5040 Unspecified combined systolic (congestive) and diastolic (congestive) heart failure: Secondary | ICD-10-CM | POA: Diagnosis not present

## 2015-11-09 DIAGNOSIS — D631 Anemia in chronic kidney disease: Secondary | ICD-10-CM | POA: Diagnosis not present

## 2015-11-09 DIAGNOSIS — M6281 Muscle weakness (generalized): Secondary | ICD-10-CM | POA: Diagnosis not present

## 2015-11-09 DIAGNOSIS — J449 Chronic obstructive pulmonary disease, unspecified: Secondary | ICD-10-CM | POA: Diagnosis not present

## 2015-11-09 DIAGNOSIS — N183 Chronic kidney disease, stage 3 (moderate): Secondary | ICD-10-CM | POA: Diagnosis not present

## 2015-11-09 DIAGNOSIS — H259 Unspecified age-related cataract: Secondary | ICD-10-CM | POA: Diagnosis not present

## 2015-11-09 DIAGNOSIS — R569 Unspecified convulsions: Secondary | ICD-10-CM | POA: Diagnosis not present

## 2015-11-09 DIAGNOSIS — R2689 Other abnormalities of gait and mobility: Secondary | ICD-10-CM | POA: Diagnosis not present

## 2015-11-09 DIAGNOSIS — M109 Gout, unspecified: Secondary | ICD-10-CM | POA: Diagnosis not present

## 2015-11-09 DIAGNOSIS — I10 Essential (primary) hypertension: Secondary | ICD-10-CM | POA: Diagnosis not present

## 2015-11-09 DIAGNOSIS — R1312 Dysphagia, oropharyngeal phase: Secondary | ICD-10-CM | POA: Diagnosis not present

## 2015-11-12 DIAGNOSIS — D631 Anemia in chronic kidney disease: Secondary | ICD-10-CM | POA: Diagnosis not present

## 2015-11-12 DIAGNOSIS — N183 Chronic kidney disease, stage 3 (moderate): Secondary | ICD-10-CM | POA: Diagnosis not present

## 2015-11-12 DIAGNOSIS — R1312 Dysphagia, oropharyngeal phase: Secondary | ICD-10-CM | POA: Diagnosis not present

## 2015-11-12 DIAGNOSIS — J449 Chronic obstructive pulmonary disease, unspecified: Secondary | ICD-10-CM | POA: Diagnosis not present

## 2015-11-12 DIAGNOSIS — I5040 Unspecified combined systolic (congestive) and diastolic (congestive) heart failure: Secondary | ICD-10-CM | POA: Diagnosis not present

## 2015-11-12 DIAGNOSIS — M6281 Muscle weakness (generalized): Secondary | ICD-10-CM | POA: Diagnosis not present

## 2015-11-12 DIAGNOSIS — I10 Essential (primary) hypertension: Secondary | ICD-10-CM | POA: Diagnosis not present

## 2015-11-12 DIAGNOSIS — R2689 Other abnormalities of gait and mobility: Secondary | ICD-10-CM | POA: Diagnosis not present

## 2015-11-12 DIAGNOSIS — H259 Unspecified age-related cataract: Secondary | ICD-10-CM | POA: Diagnosis not present

## 2015-11-12 DIAGNOSIS — M109 Gout, unspecified: Secondary | ICD-10-CM | POA: Diagnosis not present

## 2015-11-12 DIAGNOSIS — G231 Progressive supranuclear ophthalmoplegia [Steele-Richardson-Olszewski]: Secondary | ICD-10-CM | POA: Diagnosis not present

## 2015-11-13 DIAGNOSIS — J449 Chronic obstructive pulmonary disease, unspecified: Secondary | ICD-10-CM | POA: Diagnosis not present

## 2015-11-13 DIAGNOSIS — I10 Essential (primary) hypertension: Secondary | ICD-10-CM | POA: Diagnosis not present

## 2015-11-13 DIAGNOSIS — R1312 Dysphagia, oropharyngeal phase: Secondary | ICD-10-CM | POA: Diagnosis not present

## 2015-11-13 DIAGNOSIS — M109 Gout, unspecified: Secondary | ICD-10-CM | POA: Diagnosis not present

## 2015-11-13 DIAGNOSIS — G231 Progressive supranuclear ophthalmoplegia [Steele-Richardson-Olszewski]: Secondary | ICD-10-CM | POA: Diagnosis not present

## 2015-11-13 DIAGNOSIS — M6281 Muscle weakness (generalized): Secondary | ICD-10-CM | POA: Diagnosis not present

## 2015-11-13 DIAGNOSIS — I5040 Unspecified combined systolic (congestive) and diastolic (congestive) heart failure: Secondary | ICD-10-CM | POA: Diagnosis not present

## 2015-11-13 DIAGNOSIS — R2689 Other abnormalities of gait and mobility: Secondary | ICD-10-CM | POA: Diagnosis not present

## 2015-11-13 DIAGNOSIS — H259 Unspecified age-related cataract: Secondary | ICD-10-CM | POA: Diagnosis not present

## 2015-11-13 DIAGNOSIS — N183 Chronic kidney disease, stage 3 (moderate): Secondary | ICD-10-CM | POA: Diagnosis not present

## 2015-11-13 DIAGNOSIS — D631 Anemia in chronic kidney disease: Secondary | ICD-10-CM | POA: Diagnosis not present

## 2015-11-14 DIAGNOSIS — J449 Chronic obstructive pulmonary disease, unspecified: Secondary | ICD-10-CM | POA: Diagnosis not present

## 2015-11-14 DIAGNOSIS — R2689 Other abnormalities of gait and mobility: Secondary | ICD-10-CM | POA: Diagnosis not present

## 2015-11-14 DIAGNOSIS — G231 Progressive supranuclear ophthalmoplegia [Steele-Richardson-Olszewski]: Secondary | ICD-10-CM | POA: Diagnosis not present

## 2015-11-14 DIAGNOSIS — I10 Essential (primary) hypertension: Secondary | ICD-10-CM | POA: Diagnosis not present

## 2015-11-14 DIAGNOSIS — H259 Unspecified age-related cataract: Secondary | ICD-10-CM | POA: Diagnosis not present

## 2015-11-14 DIAGNOSIS — N183 Chronic kidney disease, stage 3 (moderate): Secondary | ICD-10-CM | POA: Diagnosis not present

## 2015-11-14 DIAGNOSIS — M6281 Muscle weakness (generalized): Secondary | ICD-10-CM | POA: Diagnosis not present

## 2015-11-14 DIAGNOSIS — D631 Anemia in chronic kidney disease: Secondary | ICD-10-CM | POA: Diagnosis not present

## 2015-11-14 DIAGNOSIS — I5040 Unspecified combined systolic (congestive) and diastolic (congestive) heart failure: Secondary | ICD-10-CM | POA: Diagnosis not present

## 2015-11-14 DIAGNOSIS — R1312 Dysphagia, oropharyngeal phase: Secondary | ICD-10-CM | POA: Diagnosis not present

## 2015-11-14 DIAGNOSIS — M109 Gout, unspecified: Secondary | ICD-10-CM | POA: Diagnosis not present

## 2015-11-15 DIAGNOSIS — H259 Unspecified age-related cataract: Secondary | ICD-10-CM | POA: Diagnosis not present

## 2015-11-15 DIAGNOSIS — M6281 Muscle weakness (generalized): Secondary | ICD-10-CM | POA: Diagnosis not present

## 2015-11-15 DIAGNOSIS — N183 Chronic kidney disease, stage 3 (moderate): Secondary | ICD-10-CM | POA: Diagnosis not present

## 2015-11-15 DIAGNOSIS — M109 Gout, unspecified: Secondary | ICD-10-CM | POA: Diagnosis not present

## 2015-11-15 DIAGNOSIS — R1312 Dysphagia, oropharyngeal phase: Secondary | ICD-10-CM | POA: Diagnosis not present

## 2015-11-15 DIAGNOSIS — G231 Progressive supranuclear ophthalmoplegia [Steele-Richardson-Olszewski]: Secondary | ICD-10-CM | POA: Diagnosis not present

## 2015-11-15 DIAGNOSIS — R2689 Other abnormalities of gait and mobility: Secondary | ICD-10-CM | POA: Diagnosis not present

## 2015-11-15 DIAGNOSIS — D631 Anemia in chronic kidney disease: Secondary | ICD-10-CM | POA: Diagnosis not present

## 2015-11-15 DIAGNOSIS — I5040 Unspecified combined systolic (congestive) and diastolic (congestive) heart failure: Secondary | ICD-10-CM | POA: Diagnosis not present

## 2015-11-15 DIAGNOSIS — J449 Chronic obstructive pulmonary disease, unspecified: Secondary | ICD-10-CM | POA: Diagnosis not present

## 2015-11-15 DIAGNOSIS — I10 Essential (primary) hypertension: Secondary | ICD-10-CM | POA: Diagnosis not present

## 2015-11-16 DIAGNOSIS — M109 Gout, unspecified: Secondary | ICD-10-CM | POA: Diagnosis not present

## 2015-11-16 DIAGNOSIS — H259 Unspecified age-related cataract: Secondary | ICD-10-CM | POA: Diagnosis not present

## 2015-11-16 DIAGNOSIS — N183 Chronic kidney disease, stage 3 (moderate): Secondary | ICD-10-CM | POA: Diagnosis not present

## 2015-11-16 DIAGNOSIS — I10 Essential (primary) hypertension: Secondary | ICD-10-CM | POA: Diagnosis not present

## 2015-11-16 DIAGNOSIS — R1312 Dysphagia, oropharyngeal phase: Secondary | ICD-10-CM | POA: Diagnosis not present

## 2015-11-16 DIAGNOSIS — I5040 Unspecified combined systolic (congestive) and diastolic (congestive) heart failure: Secondary | ICD-10-CM | POA: Diagnosis not present

## 2015-11-16 DIAGNOSIS — R2689 Other abnormalities of gait and mobility: Secondary | ICD-10-CM | POA: Diagnosis not present

## 2015-11-16 DIAGNOSIS — G231 Progressive supranuclear ophthalmoplegia [Steele-Richardson-Olszewski]: Secondary | ICD-10-CM | POA: Diagnosis not present

## 2015-11-16 DIAGNOSIS — D631 Anemia in chronic kidney disease: Secondary | ICD-10-CM | POA: Diagnosis not present

## 2015-11-16 DIAGNOSIS — M6281 Muscle weakness (generalized): Secondary | ICD-10-CM | POA: Diagnosis not present

## 2015-11-16 DIAGNOSIS — J449 Chronic obstructive pulmonary disease, unspecified: Secondary | ICD-10-CM | POA: Diagnosis not present

## 2015-11-17 DIAGNOSIS — D649 Anemia, unspecified: Secondary | ICD-10-CM | POA: Diagnosis not present

## 2015-11-17 DIAGNOSIS — R509 Fever, unspecified: Secondary | ICD-10-CM | POA: Diagnosis not present

## 2015-11-17 DIAGNOSIS — I10 Essential (primary) hypertension: Secondary | ICD-10-CM | POA: Diagnosis not present

## 2015-11-17 DIAGNOSIS — E559 Vitamin D deficiency, unspecified: Secondary | ICD-10-CM | POA: Diagnosis not present

## 2015-11-17 DIAGNOSIS — R0902 Hypoxemia: Secondary | ICD-10-CM | POA: Diagnosis not present

## 2015-11-17 DIAGNOSIS — E039 Hypothyroidism, unspecified: Secondary | ICD-10-CM | POA: Diagnosis not present

## 2015-11-19 DIAGNOSIS — N390 Urinary tract infection, site not specified: Secondary | ICD-10-CM | POA: Diagnosis not present

## 2015-11-20 DIAGNOSIS — H259 Unspecified age-related cataract: Secondary | ICD-10-CM | POA: Diagnosis not present

## 2015-11-20 DIAGNOSIS — R1312 Dysphagia, oropharyngeal phase: Secondary | ICD-10-CM | POA: Diagnosis not present

## 2015-11-20 DIAGNOSIS — N183 Chronic kidney disease, stage 3 (moderate): Secondary | ICD-10-CM | POA: Diagnosis not present

## 2015-11-20 DIAGNOSIS — I5040 Unspecified combined systolic (congestive) and diastolic (congestive) heart failure: Secondary | ICD-10-CM | POA: Diagnosis not present

## 2015-11-20 DIAGNOSIS — J449 Chronic obstructive pulmonary disease, unspecified: Secondary | ICD-10-CM | POA: Diagnosis not present

## 2015-11-20 DIAGNOSIS — M6281 Muscle weakness (generalized): Secondary | ICD-10-CM | POA: Diagnosis not present

## 2015-11-20 DIAGNOSIS — G231 Progressive supranuclear ophthalmoplegia [Steele-Richardson-Olszewski]: Secondary | ICD-10-CM | POA: Diagnosis not present

## 2015-11-20 DIAGNOSIS — D631 Anemia in chronic kidney disease: Secondary | ICD-10-CM | POA: Diagnosis not present

## 2015-11-20 DIAGNOSIS — R2689 Other abnormalities of gait and mobility: Secondary | ICD-10-CM | POA: Diagnosis not present

## 2015-11-20 DIAGNOSIS — I10 Essential (primary) hypertension: Secondary | ICD-10-CM | POA: Diagnosis not present

## 2015-11-20 DIAGNOSIS — M109 Gout, unspecified: Secondary | ICD-10-CM | POA: Diagnosis not present

## 2015-11-21 DIAGNOSIS — G231 Progressive supranuclear ophthalmoplegia [Steele-Richardson-Olszewski]: Secondary | ICD-10-CM | POA: Diagnosis not present

## 2015-11-21 DIAGNOSIS — M109 Gout, unspecified: Secondary | ICD-10-CM | POA: Diagnosis not present

## 2015-11-21 DIAGNOSIS — N183 Chronic kidney disease, stage 3 (moderate): Secondary | ICD-10-CM | POA: Diagnosis not present

## 2015-11-21 DIAGNOSIS — I10 Essential (primary) hypertension: Secondary | ICD-10-CM | POA: Diagnosis not present

## 2015-11-21 DIAGNOSIS — M6281 Muscle weakness (generalized): Secondary | ICD-10-CM | POA: Diagnosis not present

## 2015-11-21 DIAGNOSIS — R1312 Dysphagia, oropharyngeal phase: Secondary | ICD-10-CM | POA: Diagnosis not present

## 2015-11-21 DIAGNOSIS — I5040 Unspecified combined systolic (congestive) and diastolic (congestive) heart failure: Secondary | ICD-10-CM | POA: Diagnosis not present

## 2015-11-21 DIAGNOSIS — R2689 Other abnormalities of gait and mobility: Secondary | ICD-10-CM | POA: Diagnosis not present

## 2015-11-21 DIAGNOSIS — D631 Anemia in chronic kidney disease: Secondary | ICD-10-CM | POA: Diagnosis not present

## 2015-11-21 DIAGNOSIS — H259 Unspecified age-related cataract: Secondary | ICD-10-CM | POA: Diagnosis not present

## 2015-11-21 DIAGNOSIS — J449 Chronic obstructive pulmonary disease, unspecified: Secondary | ICD-10-CM | POA: Diagnosis not present

## 2015-11-22 DIAGNOSIS — R1312 Dysphagia, oropharyngeal phase: Secondary | ICD-10-CM | POA: Diagnosis not present

## 2015-11-22 DIAGNOSIS — G231 Progressive supranuclear ophthalmoplegia [Steele-Richardson-Olszewski]: Secondary | ICD-10-CM | POA: Diagnosis not present

## 2015-11-22 DIAGNOSIS — H259 Unspecified age-related cataract: Secondary | ICD-10-CM | POA: Diagnosis not present

## 2015-11-22 DIAGNOSIS — M109 Gout, unspecified: Secondary | ICD-10-CM | POA: Diagnosis not present

## 2015-11-22 DIAGNOSIS — I10 Essential (primary) hypertension: Secondary | ICD-10-CM | POA: Diagnosis not present

## 2015-11-22 DIAGNOSIS — D631 Anemia in chronic kidney disease: Secondary | ICD-10-CM | POA: Diagnosis not present

## 2015-11-22 DIAGNOSIS — J449 Chronic obstructive pulmonary disease, unspecified: Secondary | ICD-10-CM | POA: Diagnosis not present

## 2015-11-22 DIAGNOSIS — E440 Moderate protein-calorie malnutrition: Secondary | ICD-10-CM | POA: Diagnosis not present

## 2015-11-22 DIAGNOSIS — M6281 Muscle weakness (generalized): Secondary | ICD-10-CM | POA: Diagnosis not present

## 2015-11-22 DIAGNOSIS — I5040 Unspecified combined systolic (congestive) and diastolic (congestive) heart failure: Secondary | ICD-10-CM | POA: Diagnosis not present

## 2015-11-22 DIAGNOSIS — R2689 Other abnormalities of gait and mobility: Secondary | ICD-10-CM | POA: Diagnosis not present

## 2015-11-22 DIAGNOSIS — N183 Chronic kidney disease, stage 3 (moderate): Secondary | ICD-10-CM | POA: Diagnosis not present

## 2015-11-23 DIAGNOSIS — H259 Unspecified age-related cataract: Secondary | ICD-10-CM | POA: Diagnosis not present

## 2015-11-23 DIAGNOSIS — I10 Essential (primary) hypertension: Secondary | ICD-10-CM | POA: Diagnosis not present

## 2015-11-23 DIAGNOSIS — M6281 Muscle weakness (generalized): Secondary | ICD-10-CM | POA: Diagnosis not present

## 2015-11-23 DIAGNOSIS — M109 Gout, unspecified: Secondary | ICD-10-CM | POA: Diagnosis not present

## 2015-11-23 DIAGNOSIS — E43 Unspecified severe protein-calorie malnutrition: Secondary | ICD-10-CM | POA: Diagnosis not present

## 2015-11-23 DIAGNOSIS — G231 Progressive supranuclear ophthalmoplegia [Steele-Richardson-Olszewski]: Secondary | ICD-10-CM | POA: Diagnosis not present

## 2015-11-23 DIAGNOSIS — R1310 Dysphagia, unspecified: Secondary | ICD-10-CM | POA: Diagnosis not present

## 2015-11-23 DIAGNOSIS — J449 Chronic obstructive pulmonary disease, unspecified: Secondary | ICD-10-CM | POA: Diagnosis not present

## 2015-11-23 DIAGNOSIS — R1312 Dysphagia, oropharyngeal phase: Secondary | ICD-10-CM | POA: Diagnosis not present

## 2015-11-23 DIAGNOSIS — K121 Other forms of stomatitis: Secondary | ICD-10-CM | POA: Diagnosis not present

## 2015-11-23 DIAGNOSIS — R2689 Other abnormalities of gait and mobility: Secondary | ICD-10-CM | POA: Diagnosis not present

## 2015-11-23 DIAGNOSIS — I5020 Unspecified systolic (congestive) heart failure: Secondary | ICD-10-CM | POA: Diagnosis not present

## 2015-11-23 DIAGNOSIS — D631 Anemia in chronic kidney disease: Secondary | ICD-10-CM | POA: Diagnosis not present

## 2015-11-23 DIAGNOSIS — N183 Chronic kidney disease, stage 3 (moderate): Secondary | ICD-10-CM | POA: Diagnosis not present

## 2015-11-23 DIAGNOSIS — I5040 Unspecified combined systolic (congestive) and diastolic (congestive) heart failure: Secondary | ICD-10-CM | POA: Diagnosis not present

## 2015-11-25 DIAGNOSIS — M109 Gout, unspecified: Secondary | ICD-10-CM | POA: Diagnosis not present

## 2015-11-25 DIAGNOSIS — M6281 Muscle weakness (generalized): Secondary | ICD-10-CM | POA: Diagnosis not present

## 2015-11-25 DIAGNOSIS — R2689 Other abnormalities of gait and mobility: Secondary | ICD-10-CM | POA: Diagnosis not present

## 2015-11-25 DIAGNOSIS — D631 Anemia in chronic kidney disease: Secondary | ICD-10-CM | POA: Diagnosis not present

## 2015-11-25 DIAGNOSIS — N183 Chronic kidney disease, stage 3 (moderate): Secondary | ICD-10-CM | POA: Diagnosis not present

## 2015-11-25 DIAGNOSIS — I10 Essential (primary) hypertension: Secondary | ICD-10-CM | POA: Diagnosis not present

## 2015-11-25 DIAGNOSIS — I5040 Unspecified combined systolic (congestive) and diastolic (congestive) heart failure: Secondary | ICD-10-CM | POA: Diagnosis not present

## 2015-11-25 DIAGNOSIS — J449 Chronic obstructive pulmonary disease, unspecified: Secondary | ICD-10-CM | POA: Diagnosis not present

## 2015-11-25 DIAGNOSIS — G231 Progressive supranuclear ophthalmoplegia [Steele-Richardson-Olszewski]: Secondary | ICD-10-CM | POA: Diagnosis not present

## 2015-11-25 DIAGNOSIS — H259 Unspecified age-related cataract: Secondary | ICD-10-CM | POA: Diagnosis not present

## 2015-11-25 DIAGNOSIS — R1312 Dysphagia, oropharyngeal phase: Secondary | ICD-10-CM | POA: Diagnosis not present

## 2015-11-26 DIAGNOSIS — R1312 Dysphagia, oropharyngeal phase: Secondary | ICD-10-CM | POA: Diagnosis not present

## 2015-11-26 DIAGNOSIS — I5040 Unspecified combined systolic (congestive) and diastolic (congestive) heart failure: Secondary | ICD-10-CM | POA: Diagnosis not present

## 2015-11-26 DIAGNOSIS — R2689 Other abnormalities of gait and mobility: Secondary | ICD-10-CM | POA: Diagnosis not present

## 2015-11-26 DIAGNOSIS — M6281 Muscle weakness (generalized): Secondary | ICD-10-CM | POA: Diagnosis not present

## 2015-11-26 DIAGNOSIS — N183 Chronic kidney disease, stage 3 (moderate): Secondary | ICD-10-CM | POA: Diagnosis not present

## 2015-11-26 DIAGNOSIS — I10 Essential (primary) hypertension: Secondary | ICD-10-CM | POA: Diagnosis not present

## 2015-11-26 DIAGNOSIS — G231 Progressive supranuclear ophthalmoplegia [Steele-Richardson-Olszewski]: Secondary | ICD-10-CM | POA: Diagnosis not present

## 2015-11-26 DIAGNOSIS — M109 Gout, unspecified: Secondary | ICD-10-CM | POA: Diagnosis not present

## 2015-11-26 DIAGNOSIS — J449 Chronic obstructive pulmonary disease, unspecified: Secondary | ICD-10-CM | POA: Diagnosis not present

## 2015-11-26 DIAGNOSIS — D631 Anemia in chronic kidney disease: Secondary | ICD-10-CM | POA: Diagnosis not present

## 2015-11-26 DIAGNOSIS — H259 Unspecified age-related cataract: Secondary | ICD-10-CM | POA: Diagnosis not present

## 2015-11-27 DIAGNOSIS — R1312 Dysphagia, oropharyngeal phase: Secondary | ICD-10-CM | POA: Diagnosis not present

## 2015-11-27 DIAGNOSIS — N183 Chronic kidney disease, stage 3 (moderate): Secondary | ICD-10-CM | POA: Diagnosis not present

## 2015-11-27 DIAGNOSIS — J449 Chronic obstructive pulmonary disease, unspecified: Secondary | ICD-10-CM | POA: Diagnosis not present

## 2015-11-27 DIAGNOSIS — M6281 Muscle weakness (generalized): Secondary | ICD-10-CM | POA: Diagnosis not present

## 2015-11-27 DIAGNOSIS — I10 Essential (primary) hypertension: Secondary | ICD-10-CM | POA: Diagnosis not present

## 2015-11-27 DIAGNOSIS — D631 Anemia in chronic kidney disease: Secondary | ICD-10-CM | POA: Diagnosis not present

## 2015-11-27 DIAGNOSIS — R2689 Other abnormalities of gait and mobility: Secondary | ICD-10-CM | POA: Diagnosis not present

## 2015-11-27 DIAGNOSIS — H259 Unspecified age-related cataract: Secondary | ICD-10-CM | POA: Diagnosis not present

## 2015-11-27 DIAGNOSIS — M109 Gout, unspecified: Secondary | ICD-10-CM | POA: Diagnosis not present

## 2015-11-27 DIAGNOSIS — I5040 Unspecified combined systolic (congestive) and diastolic (congestive) heart failure: Secondary | ICD-10-CM | POA: Diagnosis not present

## 2015-11-27 DIAGNOSIS — G231 Progressive supranuclear ophthalmoplegia [Steele-Richardson-Olszewski]: Secondary | ICD-10-CM | POA: Diagnosis not present

## 2015-11-28 DIAGNOSIS — H259 Unspecified age-related cataract: Secondary | ICD-10-CM | POA: Diagnosis not present

## 2015-11-28 DIAGNOSIS — M109 Gout, unspecified: Secondary | ICD-10-CM | POA: Diagnosis not present

## 2015-11-28 DIAGNOSIS — I5040 Unspecified combined systolic (congestive) and diastolic (congestive) heart failure: Secondary | ICD-10-CM | POA: Diagnosis not present

## 2015-11-28 DIAGNOSIS — I10 Essential (primary) hypertension: Secondary | ICD-10-CM | POA: Diagnosis not present

## 2015-11-28 DIAGNOSIS — M6281 Muscle weakness (generalized): Secondary | ICD-10-CM | POA: Diagnosis not present

## 2015-11-28 DIAGNOSIS — G231 Progressive supranuclear ophthalmoplegia [Steele-Richardson-Olszewski]: Secondary | ICD-10-CM | POA: Diagnosis not present

## 2015-11-28 DIAGNOSIS — R1312 Dysphagia, oropharyngeal phase: Secondary | ICD-10-CM | POA: Diagnosis not present

## 2015-11-28 DIAGNOSIS — N183 Chronic kidney disease, stage 3 (moderate): Secondary | ICD-10-CM | POA: Diagnosis not present

## 2015-11-28 DIAGNOSIS — J449 Chronic obstructive pulmonary disease, unspecified: Secondary | ICD-10-CM | POA: Diagnosis not present

## 2015-11-28 DIAGNOSIS — D631 Anemia in chronic kidney disease: Secondary | ICD-10-CM | POA: Diagnosis not present

## 2015-11-28 DIAGNOSIS — R2689 Other abnormalities of gait and mobility: Secondary | ICD-10-CM | POA: Diagnosis not present

## 2016-01-29 DEATH — deceased

## 2017-02-22 IMAGING — XA IR PERC PLACEMENT GASTROSTOMY
1 series · 2 of 2 positions shown · non-contrast
Comparison: none

CLINICAL DATA: 68-year-old male with a history of dysphagia. He
presents for gastrostomy tube placement.

[Series 300: ir gastrostomy tube mod sed · 2 of 2 slices shown]
[im 1/2]
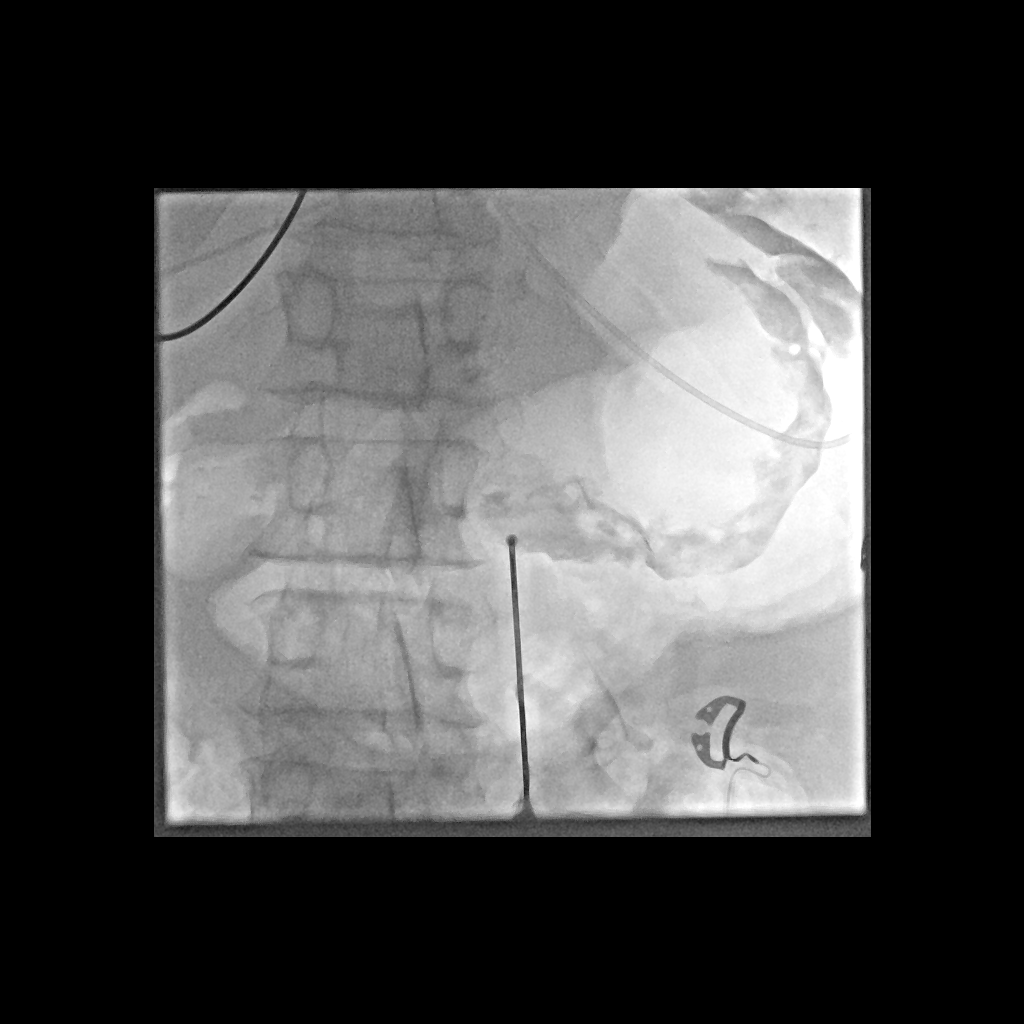
[im 2/2]
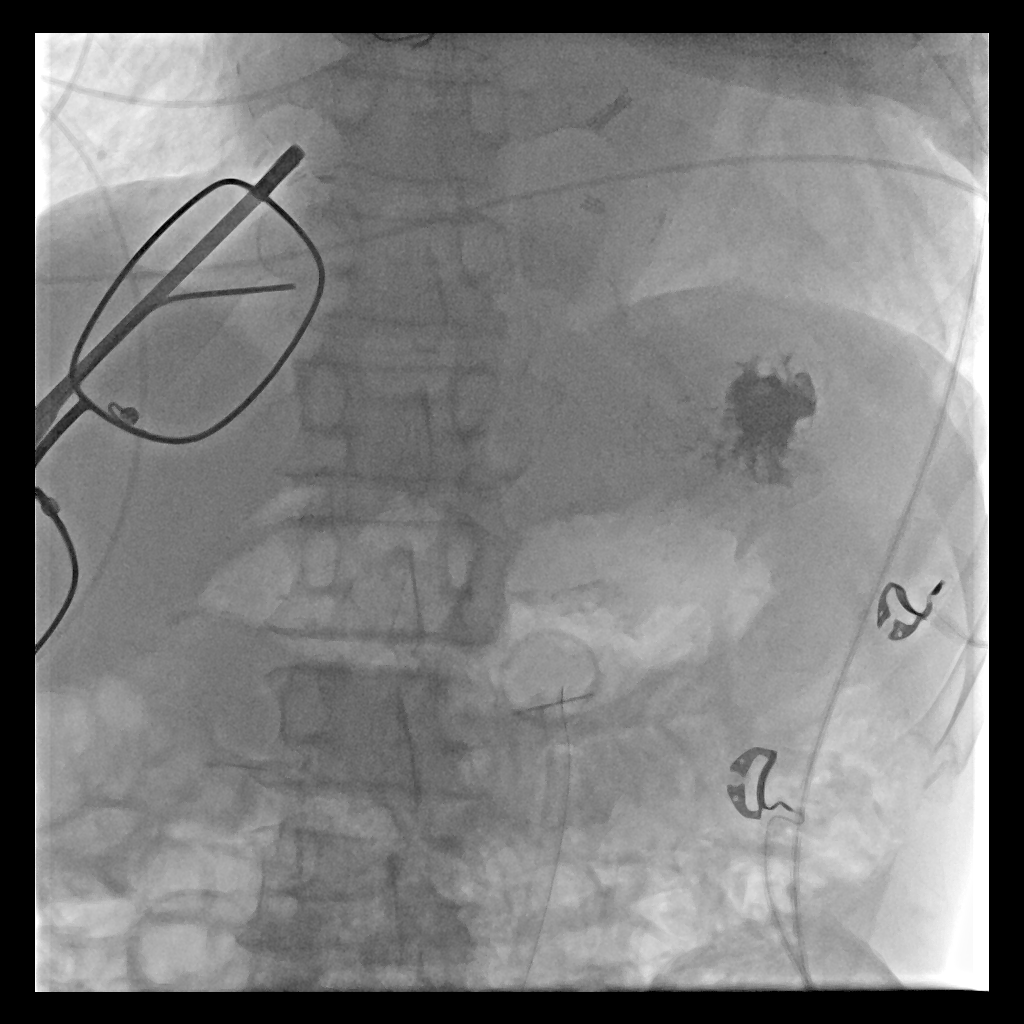

[2 of 2 positions shown; findings below may reference images not displayed]

EXAM:
PERCUTANEOUS GASTROSTOMY

FLUOROSCOPY TIME:  1 minutes 42 seconds

MEDICATIONS AND MEDICAL HISTORY:
Versed 1.0 mg, Fentanyl 50 mcg.

ANESTHESIA/SEDATION:
Moderate sedation time: 11 minutes

CONTRAST:  10 cc through the tube

PROCEDURE:
The procedure, risks, benefits, and alternatives were explained to
the patient and the patient's family. Questions regarding the
procedure were encouraged and answered. The patient understands and
consents to the procedure.

The epigastrium was prepped with Betadine in a sterile fashion, and
a sterile drape was applied covering the operative field. A sterile
gown and sterile gloves were used for the procedure.

A 5-French orogastric tube is placed under fluoroscopic guidance.
Scout imaging of the abdomen confirms barium within the transverse
colon.

The stomach was distended with gas. Under fluoroscopic guidance, an
18 gauge needle was utilized to puncture the anterior wall of the
body of the stomach. An Amplatz wire was advanced through the needle
passing a T fastener into the lumen of the stomach. The T fastener
was secured for gastropexy. A 9-French sheath was inserted.

A snare was advanced through the 9-French sheath. Oteng Katjiova Tamokate was
advanced through the orogastric tube. It was snared then pulled out
the oral cavity, pulling the snare, as well. The leading edge of the
gastrostomy was attached to the snare. It was then pulled down the
esophagus and out the percutaneous site. It was secured in place.
Contrast was injected. Patient tolerated the procedure well and
remained hemodynamically stable throughout.

No complications encountered and no significant blood loss
encountered.
FINDINGS: The image demonstrates placement of a 20-French pull-through type
gastrostomy tube into the body of the stomach.
IMPRESSION: Status post image guided percutaneous gastrostomy tube.

## 2017-05-13 IMAGING — DX DG CHEST 2V
3 series · 3 of 3 positions shown · non-contrast
Comparison: 06/19/2013

CLINICAL DATA: Wheezing, difficulty breathing, light smoker,
hypoxia yesterday, dyspnea, hypertension, CHF

EXAM:
CHEST  2 VIEW

[chest lat (1 of 2)]
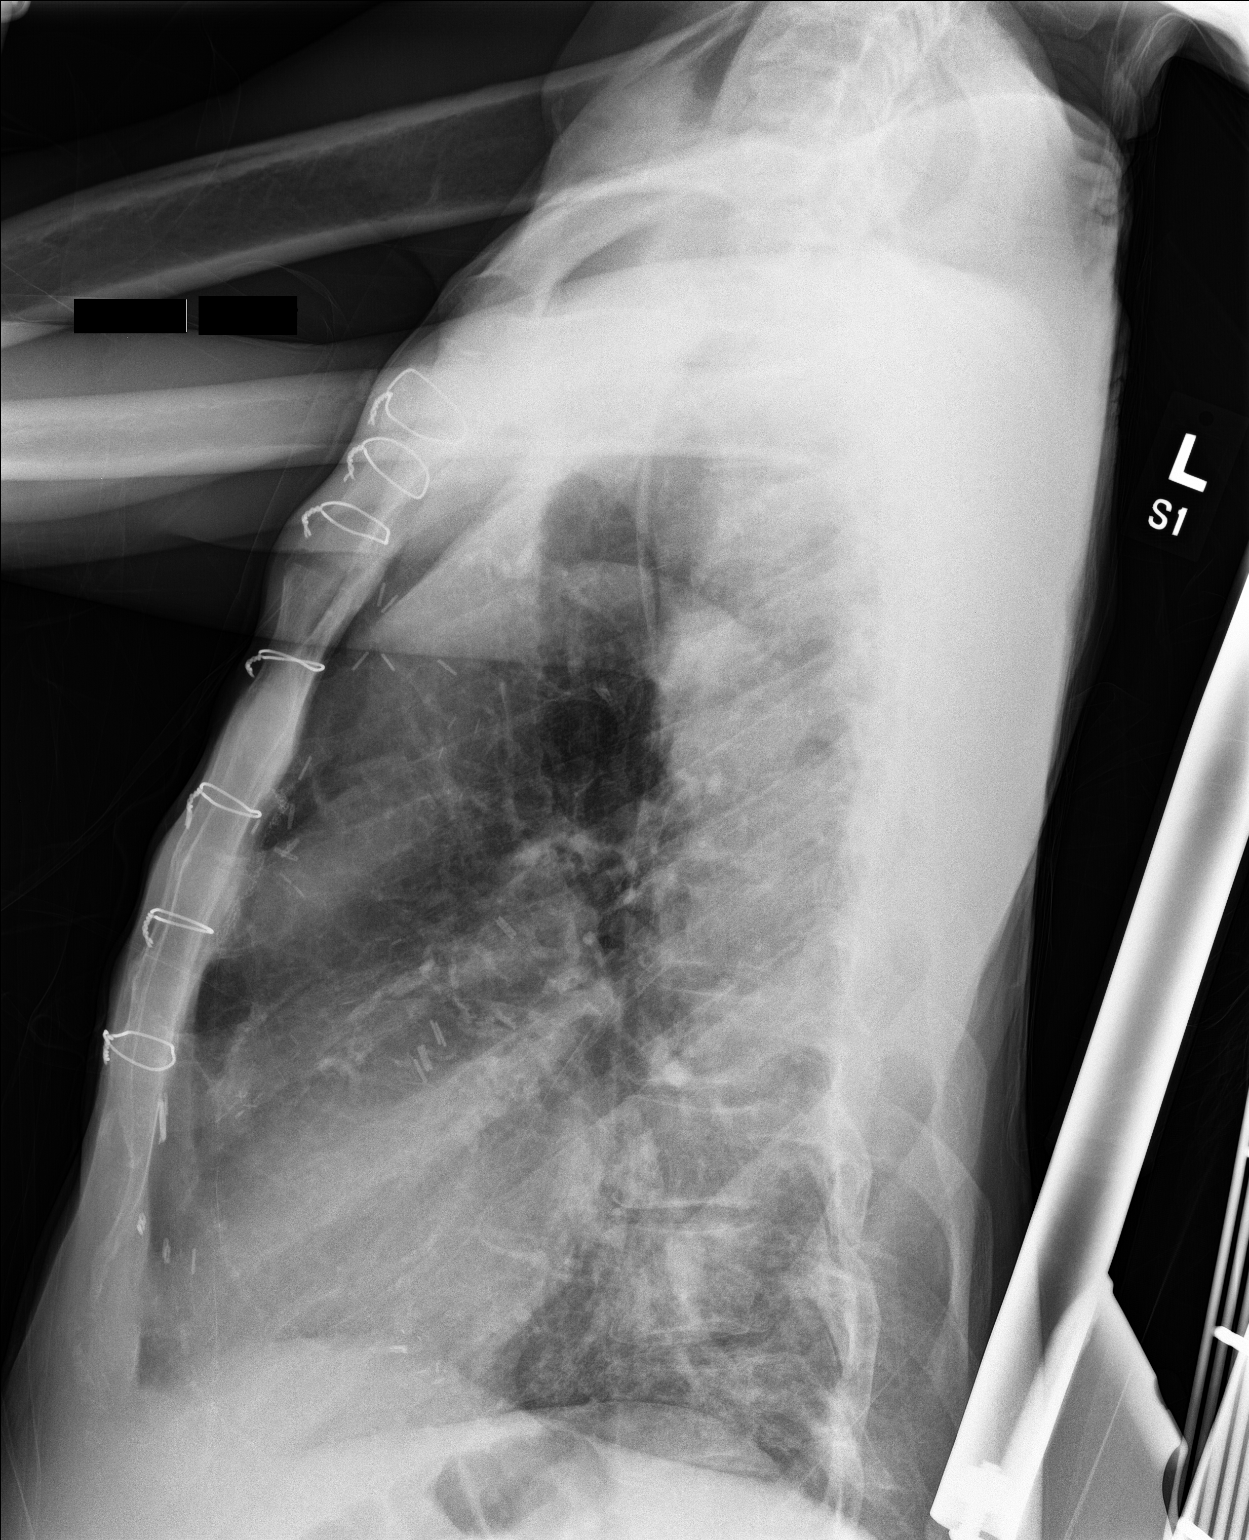

[chest lat (2 of 2)]
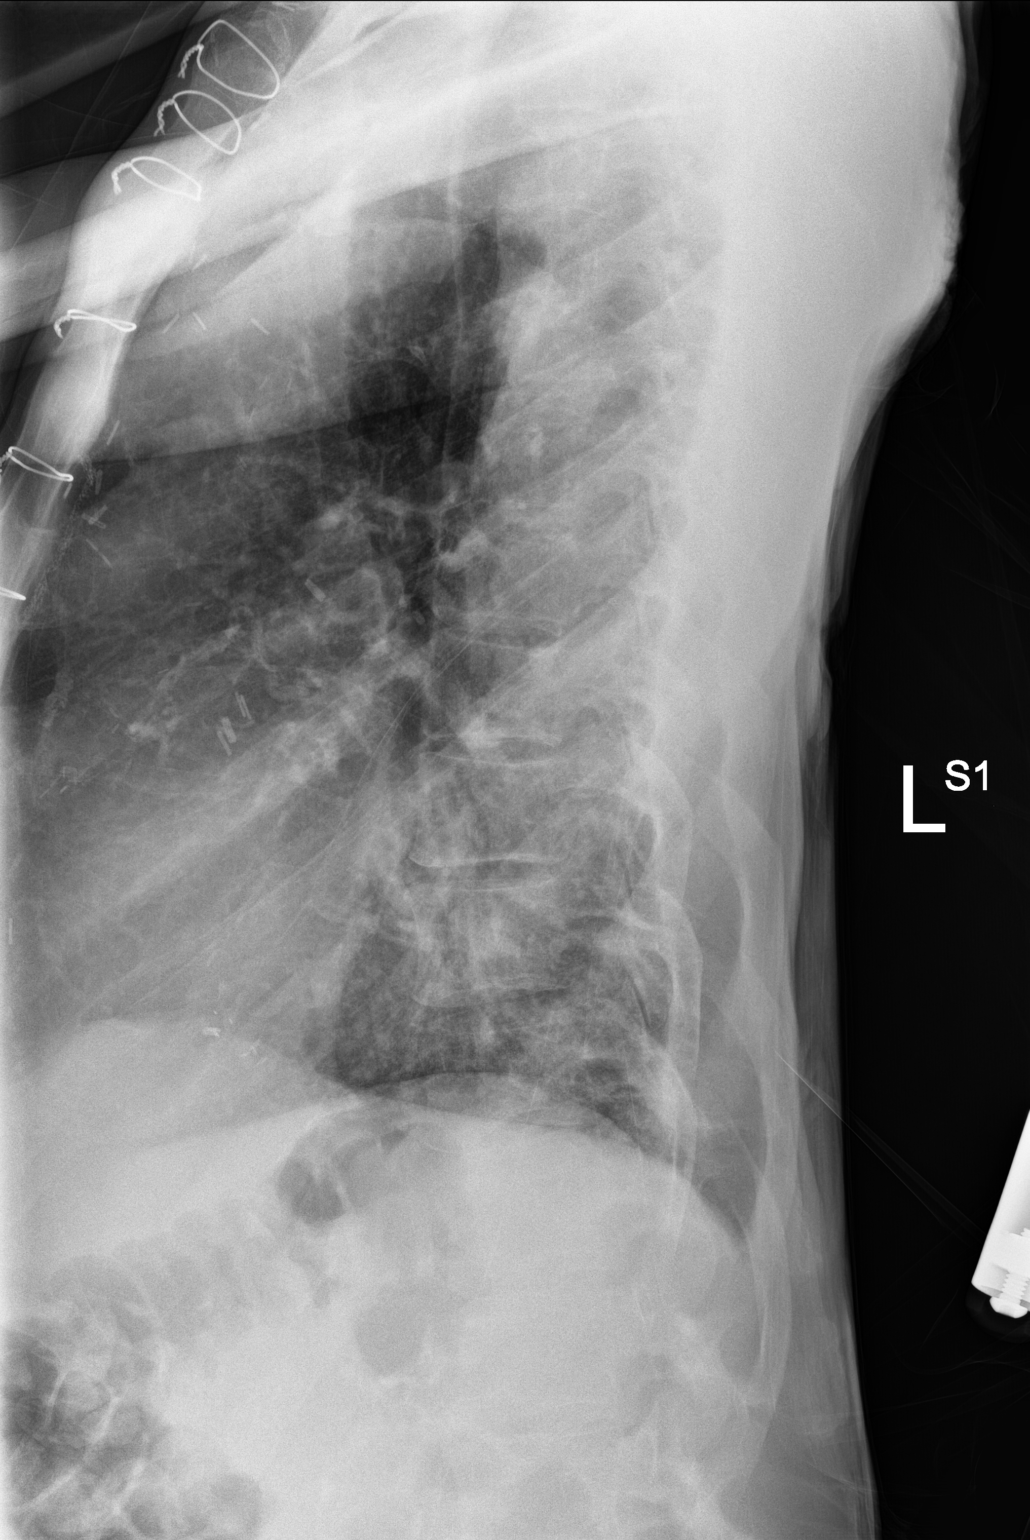

[chest ap]
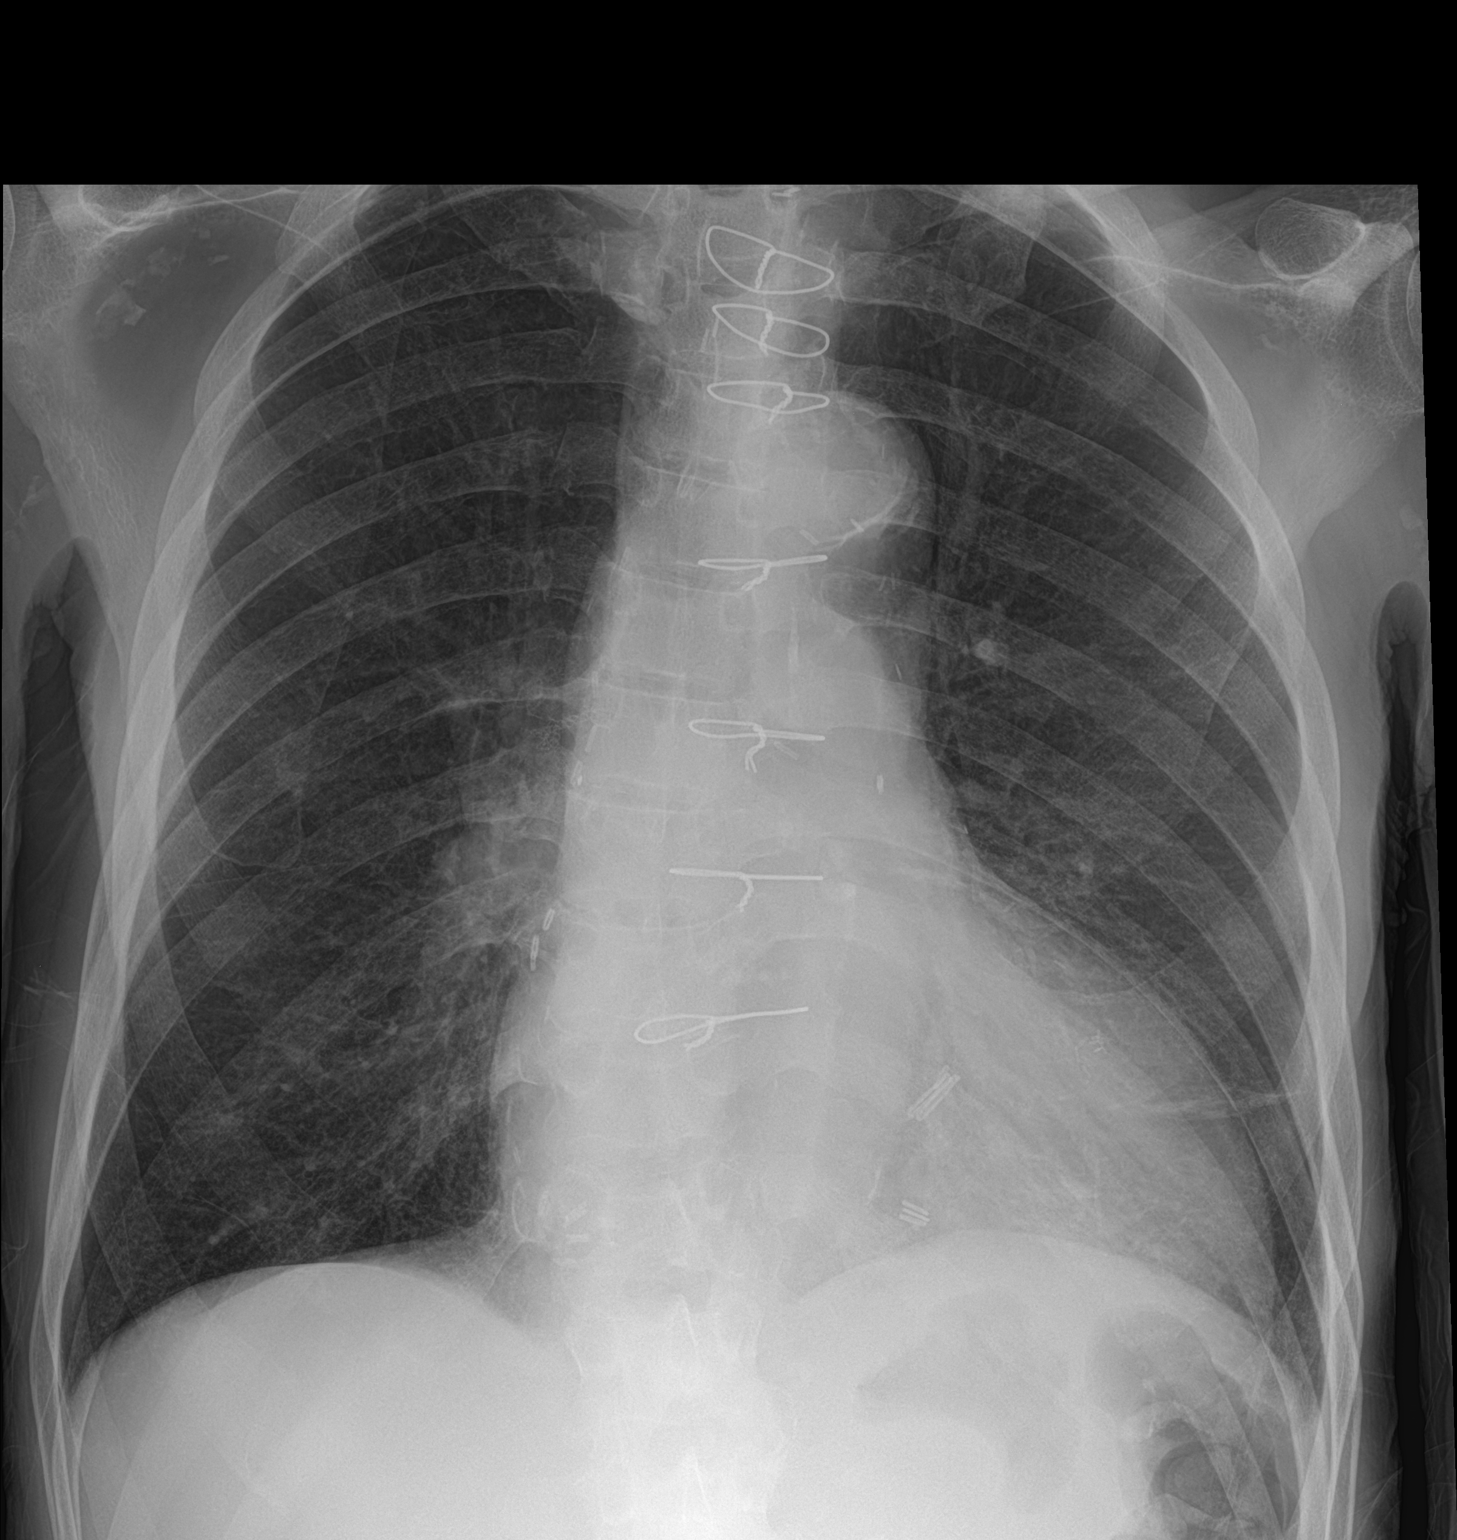

[3 of 3 positions shown; findings below may reference images not displayed]

FINDINGS: Enlargement of cardiac silhouette post median sternotomy.

Atherosclerotic calcification aorta.

Pulmonary vascularity normal.

Lungs hyperinflated with subsegmental atelectasis at LEFT base.

No definite acute infiltrate, pleural effusion or pneumothorax.

Bones demineralized.
IMPRESSION: Mild enlargement of cardiac silhouette post median sternotomy.

Hyperinflated lungs with subsegmental atelectasis LEFT base.
